# Patient Record
Sex: Male | Born: 1940 | Race: White | Hispanic: No | Smoking: Former smoker
Health system: Southern US, Community
[De-identification: ages and names within clinical notes are randomized; demographics above are authoritative.]

## PROBLEM LIST (undated history)

## (undated) DIAGNOSIS — R011 Cardiac murmur, unspecified: Secondary | ICD-10-CM

## (undated) DIAGNOSIS — F419 Anxiety disorder, unspecified: Secondary | ICD-10-CM

## (undated) DIAGNOSIS — C801 Malignant (primary) neoplasm, unspecified: Secondary | ICD-10-CM

## (undated) DIAGNOSIS — E039 Hypothyroidism, unspecified: Secondary | ICD-10-CM

## (undated) DIAGNOSIS — F32A Depression, unspecified: Secondary | ICD-10-CM

## (undated) DIAGNOSIS — M1990 Unspecified osteoarthritis, unspecified site: Secondary | ICD-10-CM

## (undated) DIAGNOSIS — I219 Acute myocardial infarction, unspecified: Secondary | ICD-10-CM

## (undated) DIAGNOSIS — M869 Osteomyelitis, unspecified: Secondary | ICD-10-CM

## (undated) DIAGNOSIS — L089 Local infection of the skin and subcutaneous tissue, unspecified: Secondary | ICD-10-CM

## (undated) DIAGNOSIS — L409 Psoriasis, unspecified: Secondary | ICD-10-CM

## (undated) DIAGNOSIS — T148XXA Other injury of unspecified body region, initial encounter: Secondary | ICD-10-CM

## (undated) DIAGNOSIS — E7800 Pure hypercholesterolemia, unspecified: Secondary | ICD-10-CM

## (undated) DIAGNOSIS — I2510 Atherosclerotic heart disease of native coronary artery without angina pectoris: Secondary | ICD-10-CM

## (undated) DIAGNOSIS — S68019A Complete traumatic metacarpophalangeal amputation of unspecified thumb, initial encounter: Secondary | ICD-10-CM

## (undated) DIAGNOSIS — R0600 Dyspnea, unspecified: Secondary | ICD-10-CM

## (undated) DIAGNOSIS — I350 Nonrheumatic aortic (valve) stenosis: Secondary | ICD-10-CM

## (undated) DIAGNOSIS — N400 Enlarged prostate without lower urinary tract symptoms: Secondary | ICD-10-CM

## (undated) DIAGNOSIS — I739 Peripheral vascular disease, unspecified: Secondary | ICD-10-CM

## (undated) HISTORY — DX: Cardiac murmur, unspecified: R01.1

## (undated) HISTORY — DX: Benign prostatic hyperplasia without lower urinary tract symptoms: N40.0

## (undated) HISTORY — DX: Peripheral vascular disease, unspecified: I73.9

## (undated) HISTORY — PX: OTHER SURGICAL HISTORY: SHX169

## (undated) HISTORY — PX: DENTAL SURGERY: SHX609

## (undated) HISTORY — DX: Nonrheumatic aortic (valve) stenosis: I35.0

## (undated) HISTORY — DX: Acute myocardial infarction, unspecified: I21.9

## (undated) HISTORY — DX: Pure hypercholesterolemia, unspecified: E78.00

## (undated) HISTORY — DX: Atherosclerotic heart disease of native coronary artery without angina pectoris: I25.10

## (undated) HISTORY — DX: Psoriasis, unspecified: L40.9

## (undated) HISTORY — DX: Osteomyelitis, unspecified: M86.9

---

## 1979-07-24 DIAGNOSIS — L409 Psoriasis, unspecified: Secondary | ICD-10-CM

## 1979-07-24 HISTORY — DX: Psoriasis, unspecified: L40.9

## 2002-05-03 ENCOUNTER — Emergency Department (HOSPITAL_COMMUNITY): Admission: EM | Admit: 2002-05-03 | Discharge: 2002-05-03 | Payer: Self-pay | Admitting: Emergency Medicine

## 2002-11-22 DIAGNOSIS — I219 Acute myocardial infarction, unspecified: Secondary | ICD-10-CM

## 2002-11-22 DIAGNOSIS — I2510 Atherosclerotic heart disease of native coronary artery without angina pectoris: Secondary | ICD-10-CM

## 2002-11-22 HISTORY — DX: Atherosclerotic heart disease of native coronary artery without angina pectoris: I25.10

## 2002-11-22 HISTORY — DX: Acute myocardial infarction, unspecified: I21.9

## 2007-03-02 ENCOUNTER — Ambulatory Visit (HOSPITAL_COMMUNITY): Admission: AD | Admit: 2007-03-02 | Discharge: 2007-03-03 | Payer: Self-pay | Admitting: Cardiovascular Disease

## 2009-09-02 ENCOUNTER — Emergency Department (HOSPITAL_COMMUNITY): Admission: EM | Admit: 2009-09-02 | Discharge: 2009-09-02 | Payer: Self-pay | Admitting: Emergency Medicine

## 2009-11-03 ENCOUNTER — Ambulatory Visit: Admission: RE | Admit: 2009-11-03 | Discharge: 2009-11-03 | Payer: Self-pay | Admitting: Family Medicine

## 2009-11-03 ENCOUNTER — Encounter (INDEPENDENT_AMBULATORY_CARE_PROVIDER_SITE_OTHER): Payer: Self-pay | Admitting: Family Medicine

## 2009-11-06 ENCOUNTER — Encounter (INDEPENDENT_AMBULATORY_CARE_PROVIDER_SITE_OTHER): Payer: Self-pay | Admitting: Family Medicine

## 2009-11-06 ENCOUNTER — Ambulatory Visit (HOSPITAL_COMMUNITY): Admission: RE | Admit: 2009-11-06 | Discharge: 2009-11-06 | Payer: Self-pay | Admitting: Family Medicine

## 2009-11-06 ENCOUNTER — Ambulatory Visit: Payer: Self-pay | Admitting: Vascular Surgery

## 2009-11-07 ENCOUNTER — Ambulatory Visit: Payer: Self-pay | Admitting: Vascular Surgery

## 2010-02-13 ENCOUNTER — Ambulatory Visit: Payer: Self-pay | Admitting: Vascular Surgery

## 2010-02-13 ENCOUNTER — Encounter: Admission: RE | Admit: 2010-02-13 | Discharge: 2010-02-13 | Payer: Self-pay | Admitting: Vascular Surgery

## 2010-07-28 ENCOUNTER — Ambulatory Visit: Payer: Self-pay | Admitting: Cardiovascular Disease

## 2011-01-05 ENCOUNTER — Ambulatory Visit (INDEPENDENT_AMBULATORY_CARE_PROVIDER_SITE_OTHER): Payer: Medicare Other | Admitting: Vascular Surgery

## 2011-01-05 DIAGNOSIS — I743 Embolism and thrombosis of arteries of the lower extremities: Secondary | ICD-10-CM

## 2011-01-06 NOTE — Assessment & Plan Note (Signed)
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  He had been seen and evaluated by myself in December 2010, extending into the first of 2011.  He had undergone echocardiogram showing no evidence of atheroembolic source and also underwent a CT angio of his chest, abdomen, and pelvis.  This showed mild dilatation of his ascending aorta.  No significant atherosclerotic changes in his thoracic aorta.  The aorta did have some irregularities in the infrarenal area and also calcified plaque and irregularity in both iliacs, more so on the right than on the left.  At that time, I recommended antiplatelet therapy with aspirin and had not suggested any further intervention.  I explained that if he did have recurrent episodes of atheroemboli, that we would recommend arteriography for further evaluation.  He presents now with new changes of cyanosis in his toes.  This has almost completely resolved since his evaluation 1 year ago, and over the past several weeks has had recurrent changes.  He is quite active, quite healthy, with no other major medical difficulties.  He also reports that he has had some chest pressure with increased activity.  He is to see Dr. Elease Hashimoto in 2 weeks.  PAST MEDICAL HISTORY:  Significant only for hypertension.  He is very healthy, very compliant with a healthy diet.  Quit smoking 3 or 4 years ago.  Does not drink alcohol.  REVIEW OF SYSTEMS:  Otherwise negative.  PHYSICAL EXAMINATION:  A well-developed and well-nourished white male in no acute distress.  Blood pressure 138/83, pulse 63, respirations 18. HEENT is normal.  Heart:  Regular rate and rhythm without murmur.  His radial, femoral, popliteal, and posterior tibial pulses are 2+ bilaterally.  Abdomen is soft, nontender with no  masses. Musculoskeletal shows no major deformities or cyanosis.  Neurologic:  No focal weakness or paresthesias.  He does have some very small punctate areas over his left great toe but does have significance cyanosis over all of his toes with some blistering over the tip of his right fourth toe.  I have recommended that he undergo arteriography for further evaluation and explained treatment options to include potential stenting of any irregularity.  I explained that it is impossible to know exactly if this was atheroembolic and where it came from.  He is quite hesitant to proceed with any intervention and wishes to discuss this further with Dr. Elease Hashimoto on his visit with him in 2 weeks.  Apparently there is some consideration of cardiac catheterization, and we should be able to coordinate this with infrarenal arteriography if needed.  We will defer any further treatment until he has had an opportunity to see Dr. Elease Hashimoto.    Larina Earthly, M.D.  TFE/MEDQ  D:  01/05/2011  T:  01/06/2011  Job:  [id number]  cc:   Duncan Dull, M.D. Vesta Mixer, M.D.

## 2011-01-19 ENCOUNTER — Ambulatory Visit (HOSPITAL_COMMUNITY): Payer: Medicare Other | Attending: Family Medicine

## 2011-01-19 DIAGNOSIS — I079 Rheumatic tricuspid valve disease, unspecified: Secondary | ICD-10-CM | POA: Insufficient documentation

## 2011-01-19 DIAGNOSIS — E785 Hyperlipidemia, unspecified: Secondary | ICD-10-CM | POA: Insufficient documentation

## 2011-01-19 DIAGNOSIS — R011 Cardiac murmur, unspecified: Secondary | ICD-10-CM | POA: Insufficient documentation

## 2011-01-19 DIAGNOSIS — I739 Peripheral vascular disease, unspecified: Secondary | ICD-10-CM | POA: Insufficient documentation

## 2011-01-19 DIAGNOSIS — I359 Nonrheumatic aortic valve disorder, unspecified: Secondary | ICD-10-CM

## 2011-01-21 ENCOUNTER — Ambulatory Visit (INDEPENDENT_AMBULATORY_CARE_PROVIDER_SITE_OTHER): Payer: Medicare Other | Admitting: Cardiovascular Disease

## 2011-01-21 DIAGNOSIS — E7800 Pure hypercholesterolemia, unspecified: Secondary | ICD-10-CM

## 2011-01-21 DIAGNOSIS — I350 Nonrheumatic aortic (valve) stenosis: Secondary | ICD-10-CM | POA: Insufficient documentation

## 2011-01-21 DIAGNOSIS — I359 Nonrheumatic aortic valve disorder, unspecified: Secondary | ICD-10-CM

## 2011-01-21 MED ORDER — METOPROLOL TARTRATE 50 MG PO TABS: 50.0000 mg | ORAL_TABLET | Freq: Two times a day (BID) | ORAL | Status: AC

## 2011-01-21 NOTE — Progress Notes (Signed)
No current outpatient prescriptions on file prior to visit.   Patient Active Problem List  Diagnoses  . Aortic stenosis   No current outpatient prescriptions on file prior to visit.   No current outpatient prescriptions on file prior to visit.   No family history on file.

## 2011-01-22 NOTE — Progress Notes (Signed)
This encounter was created in error - please disregard.

## 2011-01-22 NOTE — Progress Notes (Signed)
Addended by: Kristeen Miss on: 01/22/2011 08:57 AM   Modules accepted: Level of Service, SmartSet

## 2011-02-24 ENCOUNTER — Telehealth: Payer: Self-pay | Admitting: Cardiovascular Disease

## 2011-02-24 NOTE — Telephone Encounter (Signed)
NEEDS OFFICE NOTE FROM 01/21/11 AND ANY TELEPHONE ENCOUNTERS FROM 07/28/10 THUR PRESENT

## 2011-02-24 NOTE — Telephone Encounter (Signed)
Faxed paperwork that was asked for.Alfonso Ramus RN

## 2011-03-09 ENCOUNTER — Observation Stay (HOSPITAL_COMMUNITY)
Admission: RE | Admit: 2011-03-09 | Discharge: 2011-03-10 | Disposition: A | Discharge status: Home / Self Care | Payer: Medicare Other | Source: Ambulatory Visit | Attending: Cardiology | Admitting: Cardiology

## 2011-03-09 DIAGNOSIS — Z8249 Family history of ischemic heart disease and other diseases of the circulatory system: Secondary | ICD-10-CM | POA: Insufficient documentation

## 2011-03-09 DIAGNOSIS — I10 Essential (primary) hypertension: Secondary | ICD-10-CM | POA: Insufficient documentation

## 2011-03-09 DIAGNOSIS — Z79899 Other long term (current) drug therapy: Secondary | ICD-10-CM | POA: Insufficient documentation

## 2011-03-09 DIAGNOSIS — R0609 Other forms of dyspnea: Secondary | ICD-10-CM | POA: Insufficient documentation

## 2011-03-09 DIAGNOSIS — I2510 Atherosclerotic heart disease of native coronary artery without angina pectoris: Principal | ICD-10-CM | POA: Insufficient documentation

## 2011-03-09 DIAGNOSIS — Z7982 Long term (current) use of aspirin: Secondary | ICD-10-CM | POA: Insufficient documentation

## 2011-03-09 DIAGNOSIS — D696 Thrombocytopenia, unspecified: Secondary | ICD-10-CM | POA: Insufficient documentation

## 2011-03-09 DIAGNOSIS — I359 Nonrheumatic aortic valve disorder, unspecified: Secondary | ICD-10-CM | POA: Insufficient documentation

## 2011-03-09 DIAGNOSIS — I2582 Chronic total occlusion of coronary artery: Secondary | ICD-10-CM | POA: Insufficient documentation

## 2011-03-09 DIAGNOSIS — Z7902 Long term (current) use of antithrombotics/antiplatelets: Secondary | ICD-10-CM | POA: Insufficient documentation

## 2011-03-09 DIAGNOSIS — E7800 Pure hypercholesterolemia, unspecified: Secondary | ICD-10-CM | POA: Insufficient documentation

## 2011-03-09 DIAGNOSIS — R0989 Other specified symptoms and signs involving the circulatory and respiratory systems: Secondary | ICD-10-CM | POA: Insufficient documentation

## 2011-03-09 LAB — POCT ACTIVATED CLOTTING TIME: Activated Clotting Time: 346 seconds

## 2011-03-09 LAB — CK TOTAL AND CKMB (NOT AT ARMC): Relative Index: INVALID (ref 0.0–2.5)

## 2011-03-09 LAB — PLATELET INHIBITION P2Y12
P2Y12 % Inhibition: 33 %
Platelet Function  P2Y12: 146 [PRU] — ABNORMAL LOW (ref 194–418)
Platelet Function Baseline: 219 [PRU] (ref 194–418)

## 2011-03-10 LAB — CBC
HCT: 39.5 % (ref 39.0–52.0)
Hemoglobin: 13.8 g/dL (ref 13.0–17.0)
MCHC: 34.9 g/dL (ref 30.0–36.0)
MCV: 89.4 fL (ref 78.0–100.0)
Platelets: 115 10*3/uL — ABNORMAL LOW (ref 150–400)
RBC: 4.42 MIL/uL (ref 4.22–5.81)
RDW: 12.5 % (ref 11.5–15.5)

## 2011-03-10 LAB — BASIC METABOLIC PANEL
CO2: 25 mEq/L (ref 19–32)
Creatinine, Ser: 0.63 mg/dL (ref 0.4–1.5)

## 2011-03-25 NOTE — Discharge Summary (Signed)
[name], [name] NO.:  [id number]  MEDICAL RECORD NO.:  [id number]           PATIENT TYPE:  O  LOCATION:  6524                         FACILITY:  [facility]  PHYSICIAN:  Lema Heinkel N. Sharyn Simmons, M.D. DATE OF BIRTH:  [birth year]  DATE OF ADMISSION:  03/09/2011 DATE OF DISCHARGE:  03/10/2011                              DISCHARGE SUMMARY   ADMITTING DIAGNOSES: 1. Accelerated angina. 2. Coronary artery disease. 3. 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 4. Moderate aortic stenosis. 5. Hypercholesteremia. 6. History of tobacco abuse. 7. Positive family history of coronary artery disease.  FINAL DIAGNOSES: 1. Stable angina status post percutaneous transluminal coronary     angioplasty to mid and distal chronically total left occluded right     coronary artery with suboptimal result.  The patient will require     further PCI to completely recanalized right coronary artery. 2. Coronary artery disease. 3. 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 4. Hypercholesteremia. 5. Moderate aortic stenosis. 6. Thrombocytopenia. 7. History of tobacco abuse. 8. Positive family history of coronary artery disease.  DISCHARGE HOME MEDICATIONS: 1. Nitrostat 0.4 mg sublingual use as directed. 2. Enteric-coated aspirin 81 mg 1 tablet daily. 3. Crestor 40 mg 1 tablet daily. 4. Fish oil 1000 mg 1 capsule 3 times daily. 5. Folic acid 1 mg daily as before. 6. Magnesium oxide 1 tablet daily as before. 7. Multivitamin 1 tablet daily as before. 8. Plavix 75 mg 1 tablet daily. 9. Potassium gluconate, over the counter 1 tablet daily as before. 10.Saw Palmetto 1 tablet by mouth daily as before. 11.Vitamin B complex 1 tablet daily as before. 12.Vitamin C 1 tablet by mouth twice daily. 13.Zetia 10 mg 1 tablet daily. 14.Zinc 50 mg tablet daily as before.  DIET:  Low salt, low cholesterol.  ACTIVITY:  Increase activity slowly.  The patient  has been given post PTCA instructions.  Avoid any lifting, pushing, or pulling.  The patient has been advised to drink plenty of fluids.  Follow up with me in 1 week.  The patient will be rescheduled for PCI to chronically occluded RCA in approximately 1 month or sooner if recurrent chest pain.  The patient was seen by phase I rehab.  We will schedule him for phase II cardiac rehab once the RCA has been stented.  CONDITION AT DISCHARGE:  Stable.  BRIEF HISTORY AND HOSPITAL COURSE:  Darin Simmons is a 70 year old white male with past medical history significant for coronary artery disease, history of silent inferior wall MI in the past, moderate aortic stenosis, hypercholesteremia, history of tobacco abuse, positive family history of coronary artery disease.  Complains of exertional chest pain off and on occasionally radiating to the left arm relieved with rest. Also complains of exertional dyspnea associated with feeling weak, tired and fatigued.  Denies any nausea, vomiting, diaphoresis.  Denies PND, orthopnea, leg swelling.  Denies palpitation at present, but states that in the past.  No lightheadedness or syncope.  Denies relation of chest pain to food, breathing, or movement.  PAST MEDICAL HISTORY:  As above.  PAST  SURGICAL HISTORY:  None.  ALLERGIES:  No known drug allergies.  MEDICATION AT HOME:  He was on aspirin, Plavix, Crestor, Zetia, fish oil, multivitamin, B complex, Nitrostat sublingual, vitamin B complex, zinc, magnesium.  SOCIAL HISTORY:  He is divorced, no children.  Smoked 1 pack per day for 45 years, quit 4 years ago.  No history of alcohol abuse, retired, worked as Gaffer in the past.  FAMILY HISTORY:  His father died of MI in his 60s.  Mother is alive. She is 101.  One brother had CABG in 60s.  One brother has asthma.  One sister in good health.  PHYSICAL EXAMINATION:  GENERAL:  He is alert and oriented x3 in no acute distress. VITAL SIGNS:  Blood  pressure was 124/76, pulse was 61 and regular. HEENT:  Conjunctivae was pink. NECK:  Supple, no JVD, no bruit. LUNGS:  Clear to auscultation without rhonchi or rales. CARDIOVASCULAR:  S1 and S2, was normal.  There was 2/6 systolic murmur of aortic stenosis. ABDOMEN:  Soft.  Bowel sounds were present, nontender. EXTREMITIES:  There is no clubbing, cyanosis, or edema.  Discussed with the patient at length regarding various options of treatment, i.e. medical versus noninvasive stress testing versus left cath, possible PTCA stenting, and possibly AVR in the future, its risks and benefits i.e. death, MI, stroke, need for emergency CABG, local vascular complications, risk of perforation, etc. and consented for PCI.  BRIEF HOSPITAL COURSE:  The patient was a.m. admit and underwent left cardiac cath with selective left and right coronary angiography and PTCA to mid and distal RCA as per procedure report.  Procedure was stopped due to large volume of contrast and fluoro time of about 70 minutes. The patient had only small segment of distal RCA, which needs to be recanalized.  The patient remained stable during the procedure.  The patient did not have any chest pain during the hospitalization or during the procedure.  His groin is stable with no evidence of hematoma or bruit.  Postprocedure, his CPK is 98 with MB of 3.4.  Phase I cardiac rehab was called.  The patient has been ambulating in hallway without any problems.  His groin is stable with no evidence of hematoma or bruit.  The patient will be discharged home and will be followed up in my office in 1 week.  We will monitor his platelet count as outpatient closely.  The patient will be rescheduled for PCI to RCA in approximately 1 month or sooner if he gets recurrent chest pain.     Darin Simmons, M.D.     MNH/MEDQ  D:  03/10/2011  T:  03/10/2011  Job:  [id number]  Electronically Signed by Rinaldo Cloud M.D. on 03/25/2011 08:32:50  AM

## 2011-03-25 NOTE — Cardiovascular Report (Signed)
NAMETRAMPUS, MCQUERRY NO.:  [id number]  MEDICAL RECORD NO.:  [id number]           PATIENT TYPE:  O  LOCATION:  6524                         FACILITY:  [facility]  PHYSICIAN:  Lorra Freeman N. Sharyn Lull, M.D. DATE OF BIRTH:  May 25, 1941  DATE OF PROCEDURE:  03/09/2011 DATE OF DISCHARGE:                           CARDIAC CATHETERIZATION   PROCEDURES: 1. Left cardiac catheterization with selective left and right coronary     angiography via right groin using Judkins technique. 2. Attempted percutaneous transluminal coronary angioplasty to     chronically occluded long right coronary artery using initially     1.25 x 20 mm long Sprinter balloon and then 1.5 x 20 mm long mini     Trek balloon and then 2.0 x 20 mm long mini trek balloon.  INDICATIONS FOR PROCEDURE:  Mr. Zehnder is a 70 year old white male with past medical history significant for coronary artery disease, history of silent inferior wall myocardial infarction, moderate aortic stenosis, hypercholesteremia, history of tobacco abuse in the past, positive family history of coronary artery disease.  He complains of exertional chest pain off and on occasionally radiating to the left arm, relieved with rest.  Also complains of exertional dyspnea associated with feeling weak, tired, and fatigued.  Denies any nausea, vomiting, or diaphoresis. Denies PND, orthopnea, or leg swelling.  Denies palpitation at present, but had occasional in the past.  No lightheadedness or syncopal episode. Denies any relation of chest pain to food, breathing, or movement.  The patient had left cardiac cath approximately 4 years ago which showed a 40-50% proximal left circumflex stenosis and chronically occluded RCA beyond midportion which was filling distally from left system and acute marginal system.  I discussed with the patient regarding left cath, possible PTCA and stenting, its risks and benefits, i.e., death, MI, stroke, need for emergency  CABG, local vascular complications, and possibly need for aortic valve replacement in the future.  The patient consented for PCI.  PROCEDURE:  After obtaining the informed consent, the patient was brought to the Cath Lab and was placed on fluoroscopy table.  Right groin was prepped and draped in the usual fashion.  Xylocaine 1% was used for local anesthesia in the right groin.  With the help of thin- wall needle, a 6-French arterial sheath was placed.  The sheath was aspirated and flushed.  Next, a 6-French left Judkins catheter was advanced over the wire under fluoroscopic guidance up to the ascending aorta.  Wire was pulled out.  The catheter was aspirated and connected to the manifold.  Catheter was further advanced and engaged into left coronary ostium.  Multiple views of the left system were taken.  Next, the catheter was disengaged and was pulled out over the wire and was replaced with a 6-French right Judkins catheter which was advanced over the wire under fluoroscopic guidance up to the ascending aorta.  Wire was pulled out.  The catheter was aspirated and connected to the manifold.  Catheter was further advanced and engaged into right coronary ostium.  Multiple views of the right system were taken.  Next, the catheter was disengaged  and was pulled out over the wire.  Sheaths were aspirated and flushed.  FINDINGS:  LV was not done.  Left main was patent.  LAD has 20-25% ostial proximal and mid stenosis.  Diagonal 1 and 2 were very small. Ramus was very, very small.  Left circumflex has 50-60% proximal stenosis.  OM-1 is large which is patent.  RCA has 40-50% proximal sequential stenosis and then subtotal occlusion beyond the midportion filling from collaterals, from left system, and from acute marginal branch.  INTERVENTIONAL PROCEDURE:  Attempted to cross the lesion initially with ChoICE PT 0.0014 wire without success and then Fielder XT 0.009 wire with microcatheter for  support.  Fielder wire could not be advanced beyond the distal portion.  Fielder wire could be advanced up to the distal RCA, but could not be advanced further.  The Fielder wire was exchanged with ChoICE PT moderate support and then with 6-g [employer] and then 12-g [employer].  The wire could not be advanced beyond the bifurcation either into PDA or PLV branches and then PTCA to distal and mid RCA was done initially using 1.25 mm x 20 mm long Sprinter balloon and then 1.5 x 20 mm long mini Trek and then 2.0 x 20 mm long mini Trek balloon in distal and mid RCA going up to 10-12 atmospheric pressure.  Mid and distal RCA lesions were dilated from 90- 95% to less than 50% residual in the midportion and subtotal occlusion of distal RCA as before.  PDA and PLV branches were filling from collaterals as before.  The procedure was called off as fluoro time was above 70 minutes and total contrast used was about 300-350 mL.  The patient tolerated the procedure well.  There were no complications.  We will stage the procedure in future if the patient continues to have recurrent anginal chest pain.  The patient was transferred to the recovery room in stable condition.     Eduardo Osier. Sharyn Lull, M.D.     MNH/MEDQ  D:  03/09/2011  T:  03/10/2011  Job:  [id number]  Electronically Signed by Rinaldo Cloud M.D. on 03/25/2011 08:32:52 AM

## 2011-04-06 NOTE — Assessment & Plan Note (Signed)
OFFICE VISIT   CALEN, GEISTER  DOB:  02-23-1941                                       02/13/2010  ZOXWR#:[id number]   Patient presents today for followup of his probable atheroemboli to his  right foot.  I had seen him initially in December of 2010, at which time  he had apparent atheroemboli to his feet.  Fortunately, this has  continued to improve.  He does have occasional cyanotic changes, but  usually this is not the case and he does not have any pain.  He is quite  active, resuming his usual pre-event activities.  He has undergone a 2D  echocardiogram showing aortic stenosis but no obvious vegetations or  other cause for atrial embolus .  He is here today for evaluation with a  CT angiogram of his chest, abdomen, pelvis, and lower extremities.  I  reviewed his films with patient.   PHYSICAL EXAMINATION:  Otherwise unchanged.  His blood pressure is  115/75, pulse 58, respirations 16.  His temperature is 97.4.  He is in  no acute distress.  HEENT is normal.  Chest is clear.  Abdomen is  without masses or tenderness.  Musculoskeletal:  No major deformities or  cyanosis.  Neurologic:  No focal weakness or paresthesias.  Skin without  ulcers or rashes.   His CT scan does show a mildly dilated ascending aorta.  He has no  significant atherosclerotic changes in his thoracic aorta.  He does have  some irregularity in his infrarenal aorta and also calcified plaque and  irregularity in both iliacs, somewhat more so on the right than on the  left.   I discussed this at length with patient.  I suspect that the most likely  source of emboli would be his right iliac system.  He does not have any  focal stenosis, and I would therefore not recommend any intervention  since this is only speculative.  Should he have ongoing evidence of  emboli, we would recommend intervention on his right iliac system.  Otherwise he will continue his followup with Dr. Elease Hashimoto and see  [place] on an  as-needed basis.     Larina Earthly, M.D.  Electronically Signed   TFE/MEDQ  D:  02/13/2010  T:  02/13/2010  Job:  [id number]   cc:   Vesta Mixer, M.D.  Duncan Dull, M.D.

## 2011-04-06 NOTE — Consult Note (Signed)
NEW PATIENT CONSULTATION   Darin Simmons, Darin Simmons  DOB:  12/18/1940                                       11/07/2009  NFAOZ#:[id number]   The patient presents today for evaluation of ischemic changes on his  right foot.  He is a very active, healthy 70 year old gentleman who  reports a 2-week history of skin changes on his right foot.  He noted  mild pain but erythema and superficial ulceration over the great, second  and fourth toe.  He does not recall any trauma to this area.  He had  outpatient evaluation to rule out DVT and also outpatient arterial  study.  I have these for review as well as office notes from Dr. Kevan Ny.  He does not have any prior history of atheroembolic events.  He does  have a history of coronary artery disease with asymptomatic coronary  blockage with collateralization on studies in 2008.  He has mild  discomfort and does have more cyanosis with dependency in his toes.  He  does not have any similar changes in the left foot.   PAST MEDICAL HISTORY:  Negative for other major medical difficulties,  specifically no pulmonary disease.   FAMILY HISTORY:  Significant for premature atherosclerotic disease in  father and brother.   SOCIAL HISTORY:  He is retired.  He does not smoke, having quit in 2008.  He does not drink alcohol.   REVIEW OF SYSTEMS:  His weight is reported at 137 pounds.  He is 5 feet  11 inches tall.  He denies any weight loss or weight gain.  CARDIAC:  Significant for known aortic valvular disease with heart  murmur.  PULMONARY:  Negative.  GI:  Negative.  GU:  Negative.  VASCULAR:  Negative.  NEUROLOGIC:  Negative.  MUSCULOSKELETAL:  Negative.  PSYCHIATRIC:  Negative.  HEENT:  Negative.  HEMATOLOGIC:  Negative.  He does have a history of scalp rashes.   PHYSICAL EXAM:  Well developed, well-nourished white male appearing  stated age of 28.  Blood pressure is 106/71, pulse 60, respirations 18,  his temperature is 98.0.  He  is in no acute distress.  Head is  normocephalic, atraumatic.  Pupils equal, round, reactive to light.  Extraocular movements are intact.  Neck:  He does not have any cervical  lymphadenopathy, bruits and no JVD.  Chest:  Clear bilateral with no  rales, rhonchi or wheezes.  Heart:  Regular rate and rhythm.  He does  have a pansystolic murmur.  Pulses:  He has 2+ radial and 2+ posterior  tibial pulses bilaterally.  Abdomen:  No bruits.  Soft, nontender.  No  palpable masses.  Musculoskeletal:  No major deformities or cyanosis.  Neurologic:  No focal weakness or paresthesias.  Skin:  No ulcers or  rashes.   On reviewing his duplex exam, he did have normal ankle-arm indices but  did have dampened toe pressures on the right.  I discussed the  significance of this with the patient.  I explained that he would either  have in situ occlusion of small vessels but more likely would have an  atheroembolic event.  He does have a history of known aortic valvular  disease.  I explained this would be the most likely cause for emboli.  He is to see Dr. Elease Hashimoto in the next several weeks for repeat  echocardiogram to rule this out.  If his echocardiogram is negative, I  would recommend a CT angiogram of his chest, abdomen and pelvis and  runoff to rule out embolic source of this.  He will contact our office  for scheduling of this should he have a negative evaluation with Dr.  Elease Hashimoto.  I explained that this should be self-limiting and it does not  appear to that he will have any permanent tissue loss from his current  level of ischemia.     Larina Earthly, M.D.  Electronically Signed   TFE/MEDQ  D:  11/07/2009  T:  11/10/2009  Job:  [id number]   cc:   Duncan Dull, M.D.  Vesta Mixer, M.D.

## 2011-04-09 NOTE — Discharge Summary (Signed)
NAMEPEDRO, WHITERS NO.:  [id number]   MEDICAL RECORD NO.:  [id number]          PATIENT TYPE:  OIB   LOCATION:  6524                         FACILITY:  [facility]   PHYSICIAN:  Vesta Mixer, M.D. DATE OF BIRTH:  [birth year]   DATE OF ADMISSION:  03/02/2007  DATE OF DISCHARGE:  03/03/2007                               DISCHARGE SUMMARY   DISCHARGE DIAGNOSES:  1. Coronary artery disease - total occlusion of his right coronary      artery with good collateral filling.  2. Hyperlipidemia with low HDL.   DISCHARGE MEDICATIONS:  1. Simvastatin 40 mg q.h.s.  2. Aspirin 81 mg a day.  3. Potassium chloride __________ a day.  4. Milk thistle and  biotin once a day.  5. Ginger root and bilberry once a day.  6. Saw palmetto once a day.  7. Fish oil 1000 mg three times a day.  8. Selenium, zinc, magnesium once a day.  9. MSM once a day.  10.Chromium once a day.  11.Glucosamine once a day.  12.Acetyl L-carnitine once a day.  13.Alpha lipoic acid once a day.   DISPOSITION:  The patient will see Dr. Elease Hashimoto in 1-2 weeks for an  appointment.  He is to follow up with Dr. Kevan Ny as needed.   HISTORY:  Mr. Cobern is a 70 year old gentleman who we were asked to  see for some episodes of chest discomfort and some EKG changes.  He was  admitted for heart catheterization based on these clinical findings.  Please see dictated H&P for further details.   HOSPITAL COURSE PER PROBLEMS:  PROBLEM #1.  CHEST PAIN:  The patient had  negative cardiac enzymes.  He underwent heart catheterization and was  found to have chronic total occlusion of his right coronary artery.  He  had very good collateral filling with right to right and left to right  collaterals.  He had normal left ventricular systolic function with an  ejection fraction of around 65%.  There were no segmental wall motion  abnormalities.   We will continue with medical therapy.  His heart rate is in the low  60's and  blood pressures at the lower end of normal so I do not think  that we can add any additional medications at this time, but we would  certainly want to keep his blood pressure and heart rate in the normal  range.  His LDL was found to be 90 and his HDL was 32.  We  will discontinue his lovastatin and start him on simvastatin 40 mg a  day.  He already eats a fairly low fat, low cholesterol diet.  We will  see him back in the office, as noted above.   PROBLEM #2:  All of his other medical problems remained stable.           ______________________________  Vesta Mixer, M.D.     PJN/MEDQ  D:  03/03/2007  T:  03/03/2007  Job:  [id number]   cc:   Duncan Dull, M.D.

## 2011-04-09 NOTE — H&P (Signed)
NAMEMarland Kitchen  Darin Simmons NO.:  [id number]   MEDICAL RECORD NO.:  [id number]           PATIENT TYPE:   LOCATION:                                 FACILITY:   PHYSICIAN:  Vesta Mixer, M.D. DATE OF BIRTH:  1941/10/26   DATE OF ADMISSION:  03/01/2007  DATE OF DISCHARGE:                              HISTORY & PHYSICAL   Darin Simmons is a middle-aged gentleman with a history of chest pains and  cigarette smoking.  He was referred to [place] for further evaluation of some  chest pains that he had a couple weeks ago.   Darin Simmons was seen in our office back in 2000.  He had a stress test which  was unremarkable.  He was not seen in our office since that time.   He has been at a very stressful job for the past 3 or 4 years.  He has  is working 56 hours a week and does not get enough sleep.   Approximately 2 weeks ago, he had severe episode of chest ache.  That  episode lasted almost all night long.  The next day, he was left with  just kind of a generalized dull ache.  He waited a couple of days and  then called Dr. Kevan Ny' office.  He was seen in Dr. Kevan Ny' office later  that week and was found to have some EKG abnormalities.  He was referred  here and is now seen for further evaluation.   Darin Simmons still has some episodes of dull aching.  He has also had some  fatigue and some shortness of breath.  He had recurrence of this chest  pain when he was walking one evening.  However, the next day he did not  have any episodes of chest pain when he was doing his normal walk.  The  pain was described as a pressure.  There was a question of radiation out  into his shoulder and maybe to his arm.  It was associated with some  diaphoresis and some short of breath.  It is not associated with any  eating or drinking or change in position.   Current medications are multiple dietary supplements including milk  thistle, ginger root, Biotin, folic acid, L-carnitine, glucosamine,  brewer's yeast,  chromium, fish oil, saw palmetto, selenium.  He also  takes aspirin 81 mg a day and lovastatin 40 mg a day.  He has no known  drug allergies.   PAST MEDICAL HISTORY:  History of hyperlipidemia.   SOCIAL HISTORY:  The patient smoked a pack a day for 40 years and  stopped in 2006.   FAMILY HISTORY:  His father died at age 4 due to some heart-related  issues.  His mother is 58 years old and is still living.   REVIEW OF SYSTEMS:  Reviewed and is essentially negative except for as  noted in the HPI.   On exam, he is a middle-aged gentleman in no acute distress.  He is  alert and oriented x3 and his mood and affect are normal.  His weight is  153, blood pressure is 130/70 with heart rate of 58.  HEENT:  Exam reveals 2+ pupils carotids.  He has no bruits, no JVD, no  thyromegaly.  LUNGS:  Clear to auscultation.  HEART:  Regular rate S1-S2.  ABDOMINAL:  Exam reveals good bowel sounds and is nontender.  EXTREMITIES:  He has no clubbing, cyanosis or edema.  NEUROLOGICAL:  Exam is nonfocal.   His EKG from last week reveals normal sinus rhythm.  He has mild ST-  segment elevation in the anterior leads with T-wave inversions in the  anterolateral leads.  These are new from his EKG in 2000.  Today, his  EKG reveals normal sinus rhythm.  The ST-segment elevation anteriorly  has largely resolved and he has some nonspecific changes laterally.   We have drawn laboratory data which is pending at the time of dictation.   Darin Simmons presents with symptoms of an acute coronary syndrome and angina.  I  suspect that he may have had a very small myocardial infarction 2 weeks  ago when these pains started.  He has had some EKG changes which have  now generally resolved.  He is not having any acute problems now.  I  suspect that he ruptured a plaque and had a transient occlusion.  I have  given him a prescription for nitroglycerin.  He is on an aspirin a day.  We will anticipate doing a heart catheterization  with possible PTCA  tomorrow.  He will likely need to be admitted to the hospital because he  does not have anybody to stay with him at night.  We have discussed the  risks, benefits and options of heart catheterization.  He understands  and agrees to proceed.           ______________________________  Vesta Mixer, M.D.     PJN/MEDQ  D:  03/01/2007  T:  03/02/2007  Job:  [id number]   cc:   Duncan Dull, M.D.

## 2011-04-09 NOTE — Cardiovascular Report (Signed)
NAMECORLISS, LAMARTINA NO.:  [id number]   MEDICAL RECORD NO.:  [id number]          PATIENT TYPE:  OIB   LOCATION:  2899                         FACILITY:  [facility]   PHYSICIAN:  Vesta Mixer, M.D. DATE OF BIRTH:  July 18, 1941   DATE OF PROCEDURE:  03/02/2007  DATE OF DISCHARGE:                            CARDIAC CATHETERIZATION   PROCEDURE:  Left heart catheterization with coronary angiography and  with Angio-Seal closure.   The right femoral artery was easily cannulated using modified Seldinger  technique.  The right iliac artery was somewhat tortuous and required a  Wholey wire to get up into the central circulation.  There was a  moderate degree of calcification of the right iliac artery.  All  catheters were exchanged over a guidewire.   At the end of case, the angiogram revealed that the vessel was suitable  for Angio-Seal closure.  We proceeded with Angio-Seal closure using the  standard technique.  I was assisted by Dr. __________ Lolita Lenz.   HEMODYNAMICS:  LV pressure was 111/0.  His aortic pressure was 108/56.   Angiography, left main:  The left main is mildly calcified.  There are  moderate irregularities.   The left anterior descending artery reveals mild irregularities in the  proximal mid segments.  The distal vessel is fairly unremarkable.  There  is a large septal branch which is normal.   The left circumflex artery is a fairly large vessel.  There is a  proximal 30-40% stenosis.   The first obtuse marginal artery is a fairly large vessel.  There are  minor luminal irregularities.  The distal circumflex provides flow to a  small posterolateral segment artery which is fairly normal.   Right coronary artery is diffusely and severely diseased.  There is  moderate to severe disease in the proximal segment and then the RCA is  subtotally occluded at its midpoint.  There is some ectasia just above  the area of the occlusion/sub-occlusion.  There is  a large right  ventricular marginal branch which provides flow to the posterior  descending artery, and the posterior descending artery and distal right  coronary artery filled in a retrograde fashion.  Of note is that the  distal RCA and posterior descending artery also fill via left-to-right  collaterals.   The left ventriculogram was performed in a 30 RAO position.  We had a  slight amount of difficulty getting across the aortic valve which  explains the small gradient.  This reveals overall normal left  ventricular systolic function.  There were no segmental wall motion  abnormalities.   The right iliac angiogram was performed in a 30 RAO position.  It  reveals adequate and suitable anatomy for Angio-Seal closure.   COMPLICATIONS:  None.   CONCLUSION:  1. Single-vessel coronary artery disease involving the right coronary      artery.  Has good collateral filling from the      right-to-right and left-to-right collaterals.  2. Normal left ventricular systolic function.   The patient drove himself to the hospital today.  He will likely need to  stay for 23-hour observation.           ______________________________  Vesta Mixer, M.D.     PJN/MEDQ  D:  03/02/2007  T:  03/02/2007  Job:  [id number]   cc:   Duncan Dull, M.D.

## 2011-04-13 ENCOUNTER — Inpatient Hospital Stay (HOSPITAL_COMMUNITY)
Admission: RE | Admit: 2011-04-13 | Discharge: 2011-04-15 | DRG: 247 | Disposition: A | Discharge status: Home / Self Care | Payer: Medicare Other | Source: Ambulatory Visit | Attending: Cardiology | Admitting: Cardiology

## 2011-04-13 DIAGNOSIS — I2510 Atherosclerotic heart disease of native coronary artery without angina pectoris: Principal | ICD-10-CM | POA: Diagnosis present

## 2011-04-13 DIAGNOSIS — I2582 Chronic total occlusion of coronary artery: Secondary | ICD-10-CM | POA: Diagnosis present

## 2011-04-13 DIAGNOSIS — Z87891 Personal history of nicotine dependence: Secondary | ICD-10-CM

## 2011-04-13 DIAGNOSIS — D696 Thrombocytopenia, unspecified: Secondary | ICD-10-CM | POA: Diagnosis present

## 2011-04-13 DIAGNOSIS — E7800 Pure hypercholesterolemia, unspecified: Secondary | ICD-10-CM | POA: Diagnosis present

## 2011-04-13 DIAGNOSIS — I359 Nonrheumatic aortic valve disorder, unspecified: Secondary | ICD-10-CM | POA: Diagnosis present

## 2011-04-13 LAB — PLATELET INHIBITION P2Y12: Platelet Function Baseline: 251 [PRU] (ref 194–418)

## 2011-04-13 LAB — POCT ACTIVATED CLOTTING TIME: Activated Clotting Time: 376 seconds

## 2011-04-14 LAB — CBC
HCT: 36.7 % — ABNORMAL LOW (ref 39.0–52.0)
Hemoglobin: 12.9 g/dL — ABNORMAL LOW (ref 13.0–17.0)
MCHC: 35.1 g/dL (ref 30.0–36.0)
Platelets: 117 10*3/uL — ABNORMAL LOW (ref 150–400)
RBC: 4.14 MIL/uL — ABNORMAL LOW (ref 4.22–5.81)
RDW: 12.6 % (ref 11.5–15.5)
WBC: 7 10*3/uL (ref 4.0–10.5)

## 2011-04-14 LAB — BASIC METABOLIC PANEL
CO2: 24 mEq/L (ref 19–32)
Chloride: 111 mEq/L (ref 96–112)
GFR calc Af Amer: >60 mL/min (ref >60)
Glucose, Bld: 88 mg/dL (ref 70–99)

## 2011-04-14 LAB — CK TOTAL AND CKMB (NOT AT ARMC)
CK, MB: 4 ng/mL (ref 0.3–4.0)
Total CK: 120 U/L (ref 7–232)

## 2011-04-15 LAB — CBC
Hemoglobin: 13.9 g/dL (ref 13.0–17.0)
MCH: 31.4 pg (ref 26.0–34.0)
MCHC: 35.5 g/dL (ref 30.0–36.0)
MCV: 88.7 fL (ref 78.0–100.0)
RBC: 4.42 MIL/uL (ref 4.22–5.81)
RDW: 12.5 % (ref 11.5–15.5)
WBC: 4.7 10*3/uL (ref 4.0–10.5)

## 2011-04-15 LAB — BASIC METABOLIC PANEL
Creatinine, Ser: 0.58 mg/dL (ref 0.4–1.5)
GFR calc Af Amer: >60 mL/min (ref >60)
Sodium: 141 mEq/L (ref 135–145)

## 2011-05-03 ENCOUNTER — Ambulatory Visit (HOSPITAL_COMMUNITY): Payer: Medicare Other

## 2011-05-03 ENCOUNTER — Encounter (HOSPITAL_COMMUNITY)
Admission: RE | Admit: 2011-05-03 | Discharge: 2011-05-03 | Disposition: A | Discharge status: Home / Self Care | Payer: Medicare Other | Source: Ambulatory Visit | Attending: Cardiology | Admitting: Cardiology

## 2011-05-03 DIAGNOSIS — Z87891 Personal history of nicotine dependence: Secondary | ICD-10-CM | POA: Insufficient documentation

## 2011-05-03 DIAGNOSIS — I2510 Atherosclerotic heart disease of native coronary artery without angina pectoris: Secondary | ICD-10-CM | POA: Insufficient documentation

## 2011-05-03 DIAGNOSIS — E7800 Pure hypercholesterolemia, unspecified: Secondary | ICD-10-CM | POA: Insufficient documentation

## 2011-05-03 DIAGNOSIS — Z5189 Encounter for other specified aftercare: Secondary | ICD-10-CM | POA: Insufficient documentation

## 2011-05-03 DIAGNOSIS — I359 Nonrheumatic aortic valve disorder, unspecified: Secondary | ICD-10-CM | POA: Insufficient documentation

## 2011-05-03 DIAGNOSIS — Z9861 Coronary angioplasty status: Secondary | ICD-10-CM | POA: Insufficient documentation

## 2011-05-05 ENCOUNTER — Encounter (HOSPITAL_COMMUNITY): Payer: Medicare Other

## 2011-05-05 ENCOUNTER — Ambulatory Visit (HOSPITAL_COMMUNITY): Payer: Medicare Other

## 2011-05-07 ENCOUNTER — Ambulatory Visit (HOSPITAL_COMMUNITY): Payer: Medicare Other

## 2011-05-07 ENCOUNTER — Encounter (HOSPITAL_COMMUNITY): Payer: Medicare Other

## 2011-05-10 ENCOUNTER — Encounter (HOSPITAL_COMMUNITY): Payer: Medicare Other

## 2011-05-10 ENCOUNTER — Ambulatory Visit (HOSPITAL_COMMUNITY): Payer: Medicare Other

## 2011-05-12 ENCOUNTER — Ambulatory Visit (HOSPITAL_COMMUNITY): Payer: Medicare Other

## 2011-05-12 ENCOUNTER — Encounter (HOSPITAL_COMMUNITY): Payer: Medicare Other

## 2011-05-13 NOTE — Discharge Summary (Signed)
[name], COMMISSO NO.:  [id number]  MEDICAL RECORD NO.:  [id number]           PATIENT TYPE:  I  LOCATION:  6533                         FACILITY:  [facility]  PHYSICIAN:  Dellas Guard N. Sharyn Lull, M.D. 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  DISCHARGE DIAGNOSIS:  Stable angina status post PCI to chronically occluded right coronary artery, coronary artery disease, history of silent inferior wall myocardial infarction, hypertension, moderate aortic stenosis, two-vessel coronary artery disease, hypercholesteremia, tobacco abuse, thrombocytopenia.  DISCHARGE HOME MEDICATIONS: 1. Enteric-coated aspirin 325 mg 1 tablet daily. 2. Plavix 75 mg 1 tablet daily. 3. Nitrostat 0.4 mg sublingual use as directed. 4. Artificial tears 1 drop in both eyes as before. 5. Crestor 40 mg 1 tablet daily. 6. Fish oil 1000 mg 1 capsule three times daily. 7. Folic acid 1 mg 1 tablet daily. 8. Magnesium oxide over-the-counter one daily as before. 9. Multivitamin 1 tablet daily as before. 10.Saw palmetto 1 tablet daily by mouth as before. 11.Tamsulosin 0.4 mg 1 capsule daily as before. 12.Vitamin B complex 1 tablet daily as before. 13.Vitamin C 500 mg 1 tablet twice daily as before. 14.Zetia 10 mg 1 tablet daily. 15.Zinc 50 mg 1 tablet daily as before.  DIET:  Low-salt, low-cholesterol diet.  ACTIVITY:  Increase activity slowly.  Avoid any lifting, pushing, or pulling for 1 week.  Post PTCA stent instructions have been given.  Follow up with me in 1 week.  The patient is  scheduled for phase II cardiac rehab as an outpatient.  CONDITION AT DISCHARGE:  Stable.  BRIEF HISTORY AND HOSPITAL COURSE:  Mr. Darin Simmons is a 70 year old white male with past medical history significant for coronary artery disease, history of silent inferior wall MI, two-vessel coronary artery disease, had moderate proximal left circumflex stenosis, and chronically occluded RCA, moderate aortic stenosis, hypercholesteremia, history of tobacco abuse, family history of coronary artery disease, was admitted for elective PCI to CTO of RCA and possible FFR of left circumflex proximal lesion, and possible PCI.  The patient complains of exertional chest pain occasionally radiating to the left arm associated with dyspnea and tired and weak feeling.  The patient had attempted PCI to CTO of RCA on March 09, 2011 without complete revascularization as the patient had a large amount of contrast and fluoro time was more than 60 minutes. Procedure was stopped as the distal cap could not be traverse at that time.  The patient is brought here for stage PCI.  PAST MEDICAL HISTORY:  As above.  PAST SURGICAL HISTORY:  None.  ALLERGIES:  No known drug allergies.  MEDICATION AT HOME: 1. Enteric-coated aspirin 325 mg 1 tablet daily. 2. Plavix 75 mg 1 tablet daily. 3. Nitrostat 0.4 mg sublingual use as directed. 4. Artificial tears 1 drop in  both eyes as before. 5. Crestor 40 mg 1 tablet daily. 6. Fish oil 1000 mg 1 capsule three times daily. 7. Folic acid 1 mg 1 tablet daily. 8. Magnesium oxide over-the-counter one daily as before. 9. Multivitamin 1 tablet daily as before. 10.Saw palmetto 1 tablet daily by mouth as before. 11.Tamsulosin 0.4 mg 1 capsule daily as before. 12.Vitamin B complex 1 tablet daily as before. 13.Vitamin C 500 mg 1 tablet twice daily as before. 14.Zetia 10 mg 1 tablet daily. 15.Zinc 50 mg 1 tablet daily as before.  SOCIAL HISTORY:  Divorced.  No children.  Smoked one-pack  per day for 45 years, quit 4 years ago.  No history of alcohol abuse.  He is retired, worked as Gaffer in the past.  FAMILY HISTORY:  Positive for coronary artery disease.  PHYSICAL EXAMINATION:  GENERAL:  He is alert, awake, and oriented x3, in no acute distress. VITAL SIGNS:  Blood pressure is 140/80, pulse was 68. HEENT:  Conjunctivae was pink. NECK:  Supple.  No JVD.  No bruit. LUNGS:  Clear to auscultation without rhonchi or rales. CARDIOVASCULAR:  S1 and S2 was normal.  There was 2/6 systolic murmur of aortic stenosis. ABDOMEN:  Soft, bowel sounds present, nontender. EXTREMITIES:  There is no clubbing, cyanosis, or edema.  LABS:  Hemoglobin was 12.9, hematocrit 36.7, white count of 7.0, platelet count 117,000.  Repeat platelet count was 110,000, which has been stable from last cath in April 2012.  His post PTCA stenting, CK was 120, MB 4.0, his P2Y12 percent inhibition was 60% and PRU value was 100.  His labs today, BUN is 8, creatinine 0.58, glucose 91, potassium 3.8.  BRIEF HOSPITAL COURSE:  The patient was admitted electively on May 22, and underwent PTCA stenting to chronically occluded RCA as per procedure report.  The patient tolerated the procedure well.  There were no complications.  The patient did not have any episodes of chest pain during the hospital stay.  His groin is stable with no evidence of hematoma or bruit.  Phase I cardiac rehab was called.  The patient has been ambulating in hallway without any problems due to large amount of contrast load and the patient was well hydrated and renal function was monitored closely.  His renal function stayed stable.  The patient will be discharged home on the above medications and will be followed up in my office in 1 week.  The patient is scheduled for phase II cardiac rehab as an outpatient.     Eduardo Osier. Sharyn Lull, M.D.     MNH/MEDQ  D:  04/15/2011  T:  04/16/2011  Job:  [id number]  Electronically Signed by  Rinaldo Cloud M.D. on 05/13/2011 09:37:10 AM

## 2011-05-13 NOTE — Cardiovascular Report (Signed)
NAMEJAYCUB, NOORANI NO.:  [id number]  MEDICAL RECORD NO.:  [id number]           PATIENT TYPE:  O  LOCATION:  6533                         FACILITY:  [facility]  PHYSICIAN:  Aleida Crandell N. Sharyn Lull, M.D. DATE OF BIRTH:  February 26, 1941  DATE OF PROCEDURE:  04/13/2011 DATE OF DISCHARGE:                           CARDIAC CATHETERIZATION   PROCEDURE: 1. Left cardiac catheterization with selective left and right coronary     angiography via right groin using Judkins technique. 2. Successful percutaneous transluminal coronary angioplasty to     chronically occluded mid and distal right coronary artery using     initially 1.5 x 20-mm long Sprinter balloon and then 2.5 x 20 mm     long Sprinter balloon. 3. Successful deployment of 2.5 x 38-mm long Xience drug-eluting stent     in distal right coronary artery. 4. Successful postdilatation of this stent using 2.75 x 20-mm long [place]     Trek balloon. 5. Successful deployment of 2.75 x 38-mm long Xience drug-eluting     stent in mid right coronary artery. 6. Successful postdilatation of this stent using 3.0 x 25-mm long [place]     Trek balloon going up to 13 atmospheric pressure. 7. Successful deployment of 2.75 x 24-mm long PROMUS Element drug-     eluting stent in proximal right coronary artery. 8. Successful postdilatation of this stent using 3.25 x 20-mm long [place]     Trek balloon going up to 13 atmospheric pressure.  INDICATIONS FOR THE PROCEDURE:  Mr. Barreiro is a 70 year old white male with past medical history significant for coronary artery disease, history of silent inferior wall MI, two-vessel coronary artery disease. He had moderate proximal left circumflex stenosis and chronically occluded RCA, moderate aortic stenosis, hypercholesteremia, history of tobacco abuse, positive family history of coronary artery disease was admitted for elective PCI to CTO of RCA and possible FFR of left circumflex proximal lesion and possible PCI.   The patient complains of exertional chest pain occasionally radiating to the left arm associated with dyspnea and tired and weak feeling.  The patient had attempted PCI to CTO of RCA on March 09, 2011, without completely vascularization as the patient had large amount of contrast and fluoro time was more than 60 minutes.  The patient is brought for staged elective PCI to CTO of RCA.  PROCEDURE:  After obtaining the informed consent, the patient was brought to the cath lab and was placed on fluoroscopy table.  Right groin was prepped and draped in usual fashion.  Xylocaine 1% was used for local anesthesia in the right groin.  With the help of thin-wall needle, a 6-French arterial sheath was placed.  The sheath was aspirated and flushed.  Next, 6-French left Judkins catheter was advanced over the wire under fluoroscopic guidance up to the ascending aorta.  Wire was pulled out, the catheter was aspirated and connected to the manifold. Catheter was further advanced and engaged into left coronary ostium. Multiple views of the left system were taken.  Next, the catheter was disengaged and was pulled out over the wire and was replaced with 6-  French right guiding catheter which was advanced over the wire under fluoroscopic guidance up to the ascending aorta.  Wire was pulled out, the catheter was aspirated and connected to the manifold.  Catheter was further advanced  and engaged into right coronary ostium.  Multiple views of the right system were taken.  FINDINGS:  LV was not done and left main was patent.  LAD has mild disease as before with faint collaterals to distal RCA.  Left circumflex has 50-60% proximal stenosis as before.  RCA has 50-60% mid sequential stenosis and 100% occluded distally long chronically occluded lesion with collaterals from RV branch.  INTERVENTIONAL PROCEDURE:  Successful PTCA to distal RCA and ostial PDA was done initially using 1.5 x 20-mm long Sprinter balloon  and then 2.5 x 20-mm long Sprinter balloon for predilatation and then 2.5 x 38-mm long Xience DES was deployed at 11 atmospheric pressure.  Stent was postdilated using 2.75 x 20-mm long [place] Trek balloon going up to 13 atmospheric pressure and then 2.75 x 38-mm long Xience drug-eluting stent was deployed at 11 atmospheric pressure in mid RCA.  The stent was postdilated using 3.0 x 25-mm long Trek balloon going up to 13 atmospheric pressure and then 2.75 x 24-mm long PROMUS Element drug- eluting stent was deployed at 11 atmospheric pressure in proximal RCA. The stent was postdilated using 3.25 x 20 mm long [place] Trek balloon going up to 13 atmospheric pressure.  Lesions were dilated from 50-60%-100% to less than 10% residual with excellent TIMI grade 3 distal flow without evidence of dissection or distal embolization.  The patient received weight-based Angiomax and 600 mg of Plavix during the procedure.  The patient tolerated the procedure well.  There were no complications.  The patient did receive more than 475  mL of contrast and more than 70 minutes of fluoro time as the patient had long chronically occluded RCA lesion. We had significant difficulty in crossing the lesion using fine cross and filter wire initially and then Choice PT and Luge wires in the end.  The patient tolerated the procedure well.  There were no complications.  The patient was transferred to the recovery room in stable condition.  We will hydrate him well and monitor his renal function closely.     Eduardo Osier. Sharyn Lull, M.D.     MNH/MEDQ  D:  04/13/2011  T:  04/14/2011  Job:  [id number]  cc:   Cath Lab  Electronically Signed by Rinaldo Cloud M.D. on 05/13/2011 09:37:06 AM

## 2011-05-14 ENCOUNTER — Ambulatory Visit (HOSPITAL_COMMUNITY): Payer: Medicare Other

## 2011-05-14 ENCOUNTER — Encounter (HOSPITAL_COMMUNITY): Payer: Medicare Other

## 2011-05-17 ENCOUNTER — Ambulatory Visit (HOSPITAL_COMMUNITY): Payer: Medicare Other

## 2011-05-17 ENCOUNTER — Encounter (HOSPITAL_COMMUNITY): Payer: Medicare Other

## 2011-05-19 ENCOUNTER — Ambulatory Visit (HOSPITAL_COMMUNITY): Payer: Medicare Other

## 2011-05-19 ENCOUNTER — Encounter (HOSPITAL_COMMUNITY): Payer: Medicare Other

## 2011-05-21 ENCOUNTER — Encounter (HOSPITAL_COMMUNITY): Payer: Medicare Other

## 2011-05-21 ENCOUNTER — Ambulatory Visit (HOSPITAL_COMMUNITY): Payer: Medicare Other

## 2011-05-24 ENCOUNTER — Encounter (HOSPITAL_COMMUNITY): Payer: Medicare Other | Attending: Cardiology

## 2011-05-24 ENCOUNTER — Ambulatory Visit (HOSPITAL_COMMUNITY): Payer: Medicare Other

## 2011-05-24 DIAGNOSIS — Z5189 Encounter for other specified aftercare: Secondary | ICD-10-CM | POA: Insufficient documentation

## 2011-05-24 DIAGNOSIS — Z87891 Personal history of nicotine dependence: Secondary | ICD-10-CM | POA: Insufficient documentation

## 2011-05-24 DIAGNOSIS — E7800 Pure hypercholesterolemia, unspecified: Secondary | ICD-10-CM | POA: Insufficient documentation

## 2011-05-24 DIAGNOSIS — I359 Nonrheumatic aortic valve disorder, unspecified: Secondary | ICD-10-CM | POA: Insufficient documentation

## 2011-05-24 DIAGNOSIS — Z9861 Coronary angioplasty status: Secondary | ICD-10-CM | POA: Insufficient documentation

## 2011-05-24 DIAGNOSIS — I2510 Atherosclerotic heart disease of native coronary artery without angina pectoris: Secondary | ICD-10-CM | POA: Insufficient documentation

## 2011-05-26 ENCOUNTER — Encounter (HOSPITAL_COMMUNITY): Payer: Medicare Other

## 2011-05-26 ENCOUNTER — Ambulatory Visit (HOSPITAL_COMMUNITY): Payer: Medicare Other

## 2011-05-28 ENCOUNTER — Ambulatory Visit (HOSPITAL_COMMUNITY): Payer: Medicare Other

## 2011-05-28 ENCOUNTER — Encounter (HOSPITAL_COMMUNITY): Payer: Medicare Other

## 2011-05-31 ENCOUNTER — Encounter (HOSPITAL_COMMUNITY): Payer: Medicare Other

## 2011-05-31 ENCOUNTER — Ambulatory Visit (HOSPITAL_COMMUNITY): Payer: Medicare Other

## 2011-06-02 ENCOUNTER — Encounter (HOSPITAL_COMMUNITY): Payer: Medicare Other

## 2011-06-02 ENCOUNTER — Ambulatory Visit (HOSPITAL_COMMUNITY): Payer: Medicare Other

## 2011-06-04 ENCOUNTER — Ambulatory Visit (HOSPITAL_COMMUNITY): Payer: Medicare Other

## 2011-06-04 ENCOUNTER — Encounter (HOSPITAL_COMMUNITY): Payer: Medicare Other

## 2011-06-07 ENCOUNTER — Ambulatory Visit (HOSPITAL_COMMUNITY): Payer: Medicare Other

## 2011-06-07 ENCOUNTER — Encounter (HOSPITAL_COMMUNITY): Payer: Medicare Other

## 2011-06-09 ENCOUNTER — Encounter (HOSPITAL_COMMUNITY): Payer: Medicare Other

## 2011-06-09 ENCOUNTER — Ambulatory Visit (HOSPITAL_COMMUNITY): Payer: Medicare Other

## 2011-06-11 ENCOUNTER — Ambulatory Visit (HOSPITAL_COMMUNITY): Payer: Medicare Other

## 2011-06-11 ENCOUNTER — Encounter (HOSPITAL_COMMUNITY): Payer: Medicare Other

## 2011-06-14 ENCOUNTER — Ambulatory Visit (HOSPITAL_COMMUNITY): Payer: Medicare Other

## 2011-06-14 ENCOUNTER — Encounter (HOSPITAL_COMMUNITY): Payer: Medicare Other

## 2011-06-16 ENCOUNTER — Encounter (HOSPITAL_COMMUNITY): Payer: Medicare Other

## 2011-06-16 ENCOUNTER — Ambulatory Visit (HOSPITAL_COMMUNITY): Payer: Medicare Other

## 2011-06-18 ENCOUNTER — Encounter (HOSPITAL_COMMUNITY): Payer: Medicare Other

## 2011-06-18 ENCOUNTER — Ambulatory Visit (HOSPITAL_COMMUNITY): Payer: Medicare Other

## 2011-06-21 ENCOUNTER — Encounter (HOSPITAL_COMMUNITY): Payer: Medicare Other

## 2011-06-21 ENCOUNTER — Ambulatory Visit (HOSPITAL_COMMUNITY): Payer: Medicare Other

## 2011-06-23 ENCOUNTER — Encounter (HOSPITAL_COMMUNITY): Payer: Medicare Other

## 2011-06-23 ENCOUNTER — Ambulatory Visit (HOSPITAL_COMMUNITY): Payer: Medicare Other

## 2011-06-25 ENCOUNTER — Ambulatory Visit (HOSPITAL_COMMUNITY): Payer: Medicare Other

## 2011-06-25 ENCOUNTER — Encounter (HOSPITAL_COMMUNITY): Payer: Medicare Other

## 2011-06-28 ENCOUNTER — Ambulatory Visit (HOSPITAL_COMMUNITY): Payer: Medicare Other

## 2011-06-28 ENCOUNTER — Encounter (HOSPITAL_COMMUNITY): Payer: Medicare Other

## 2011-06-30 ENCOUNTER — Ambulatory Visit (HOSPITAL_COMMUNITY): Payer: Medicare Other

## 2011-06-30 ENCOUNTER — Encounter (HOSPITAL_COMMUNITY): Payer: Medicare Other | Attending: Cardiology

## 2011-06-30 DIAGNOSIS — Z9861 Coronary angioplasty status: Secondary | ICD-10-CM | POA: Insufficient documentation

## 2011-06-30 DIAGNOSIS — Z5189 Encounter for other specified aftercare: Secondary | ICD-10-CM | POA: Insufficient documentation

## 2011-06-30 DIAGNOSIS — E7800 Pure hypercholesterolemia, unspecified: Secondary | ICD-10-CM | POA: Insufficient documentation

## 2011-06-30 DIAGNOSIS — I359 Nonrheumatic aortic valve disorder, unspecified: Secondary | ICD-10-CM | POA: Insufficient documentation

## 2011-06-30 DIAGNOSIS — Z87891 Personal history of nicotine dependence: Secondary | ICD-10-CM | POA: Insufficient documentation

## 2011-06-30 DIAGNOSIS — I2510 Atherosclerotic heart disease of native coronary artery without angina pectoris: Secondary | ICD-10-CM | POA: Insufficient documentation

## 2011-07-02 ENCOUNTER — Encounter (HOSPITAL_COMMUNITY): Payer: Medicare Other

## 2011-07-02 ENCOUNTER — Ambulatory Visit (HOSPITAL_COMMUNITY): Payer: Medicare Other

## 2011-07-05 ENCOUNTER — Ambulatory Visit (HOSPITAL_COMMUNITY): Payer: Medicare Other

## 2011-07-05 ENCOUNTER — Encounter (HOSPITAL_COMMUNITY): Payer: Medicare Other

## 2011-07-07 ENCOUNTER — Ambulatory Visit (HOSPITAL_COMMUNITY): Payer: Medicare Other

## 2011-07-07 ENCOUNTER — Encounter (HOSPITAL_COMMUNITY): Payer: Medicare Other

## 2011-07-09 ENCOUNTER — Encounter (HOSPITAL_COMMUNITY): Payer: Medicare Other

## 2011-07-09 ENCOUNTER — Ambulatory Visit (HOSPITAL_COMMUNITY): Payer: Medicare Other

## 2011-07-12 ENCOUNTER — Ambulatory Visit (HOSPITAL_COMMUNITY): Payer: Medicare Other

## 2011-07-12 ENCOUNTER — Encounter (HOSPITAL_COMMUNITY): Payer: Medicare Other

## 2011-07-14 ENCOUNTER — Encounter (HOSPITAL_COMMUNITY): Payer: Medicare Other

## 2011-07-14 ENCOUNTER — Ambulatory Visit (HOSPITAL_COMMUNITY): Payer: Medicare Other

## 2011-07-16 ENCOUNTER — Ambulatory Visit (HOSPITAL_COMMUNITY): Payer: Medicare Other

## 2011-07-16 ENCOUNTER — Encounter (HOSPITAL_COMMUNITY): Payer: Medicare Other

## 2011-07-19 ENCOUNTER — Ambulatory Visit (HOSPITAL_COMMUNITY): Payer: Medicare Other

## 2011-07-19 ENCOUNTER — Encounter (HOSPITAL_COMMUNITY): Payer: Medicare Other

## 2011-07-21 ENCOUNTER — Ambulatory Visit (HOSPITAL_COMMUNITY): Payer: Medicare Other

## 2011-07-21 ENCOUNTER — Encounter (HOSPITAL_COMMUNITY): Payer: Medicare Other

## 2011-07-23 ENCOUNTER — Encounter (HOSPITAL_COMMUNITY): Payer: Medicare Other

## 2011-07-23 ENCOUNTER — Ambulatory Visit (HOSPITAL_COMMUNITY): Payer: Medicare Other

## 2011-07-26 ENCOUNTER — Ambulatory Visit (HOSPITAL_COMMUNITY): Payer: Medicare Other

## 2011-07-26 ENCOUNTER — Encounter (HOSPITAL_COMMUNITY): Payer: Medicare Other

## 2011-07-28 ENCOUNTER — Ambulatory Visit (HOSPITAL_COMMUNITY): Payer: Medicare Other

## 2011-07-28 ENCOUNTER — Encounter (HOSPITAL_COMMUNITY): Payer: Medicare Other | Attending: Cardiology

## 2011-07-28 DIAGNOSIS — Z9861 Coronary angioplasty status: Secondary | ICD-10-CM | POA: Insufficient documentation

## 2011-07-28 DIAGNOSIS — E7800 Pure hypercholesterolemia, unspecified: Secondary | ICD-10-CM | POA: Insufficient documentation

## 2011-07-28 DIAGNOSIS — I359 Nonrheumatic aortic valve disorder, unspecified: Secondary | ICD-10-CM | POA: Insufficient documentation

## 2011-07-28 DIAGNOSIS — Z87891 Personal history of nicotine dependence: Secondary | ICD-10-CM | POA: Insufficient documentation

## 2011-07-28 DIAGNOSIS — I2510 Atherosclerotic heart disease of native coronary artery without angina pectoris: Secondary | ICD-10-CM | POA: Insufficient documentation

## 2011-07-28 DIAGNOSIS — Z5189 Encounter for other specified aftercare: Secondary | ICD-10-CM | POA: Insufficient documentation

## 2011-07-30 ENCOUNTER — Ambulatory Visit (HOSPITAL_COMMUNITY): Payer: Medicare Other

## 2011-07-30 ENCOUNTER — Encounter (HOSPITAL_COMMUNITY): Payer: Medicare Other

## 2011-08-02 ENCOUNTER — Encounter (HOSPITAL_COMMUNITY): Payer: Medicare Other

## 2011-08-02 ENCOUNTER — Ambulatory Visit (HOSPITAL_COMMUNITY): Payer: Medicare Other

## 2011-08-04 ENCOUNTER — Ambulatory Visit (HOSPITAL_COMMUNITY): Payer: Medicare Other

## 2011-08-04 ENCOUNTER — Encounter (HOSPITAL_COMMUNITY): Payer: Medicare Other

## 2011-08-06 ENCOUNTER — Ambulatory Visit (HOSPITAL_COMMUNITY): Payer: Medicare Other

## 2011-08-06 ENCOUNTER — Encounter (HOSPITAL_COMMUNITY): Payer: Medicare Other

## 2011-08-09 ENCOUNTER — Encounter (HOSPITAL_COMMUNITY): Payer: Medicare Other

## 2011-08-11 ENCOUNTER — Encounter (HOSPITAL_COMMUNITY): Payer: Medicare Other

## 2011-08-13 ENCOUNTER — Encounter (HOSPITAL_COMMUNITY): Payer: Medicare Other

## 2011-08-16 ENCOUNTER — Encounter (HOSPITAL_COMMUNITY): Payer: Medicare Other

## 2011-08-18 ENCOUNTER — Encounter (HOSPITAL_COMMUNITY): Payer: Medicare Other

## 2011-08-20 ENCOUNTER — Encounter (HOSPITAL_COMMUNITY): Payer: Medicare Other

## 2011-08-23 ENCOUNTER — Encounter (HOSPITAL_COMMUNITY): Payer: Medicare Other | Attending: Cardiology

## 2011-08-23 DIAGNOSIS — I359 Nonrheumatic aortic valve disorder, unspecified: Secondary | ICD-10-CM | POA: Insufficient documentation

## 2011-08-23 DIAGNOSIS — E7800 Pure hypercholesterolemia, unspecified: Secondary | ICD-10-CM | POA: Insufficient documentation

## 2011-08-23 DIAGNOSIS — Z9861 Coronary angioplasty status: Secondary | ICD-10-CM | POA: Insufficient documentation

## 2011-08-23 DIAGNOSIS — I2510 Atherosclerotic heart disease of native coronary artery without angina pectoris: Secondary | ICD-10-CM | POA: Insufficient documentation

## 2011-08-23 DIAGNOSIS — Z5189 Encounter for other specified aftercare: Secondary | ICD-10-CM | POA: Insufficient documentation

## 2011-08-23 DIAGNOSIS — Z87891 Personal history of nicotine dependence: Secondary | ICD-10-CM | POA: Insufficient documentation

## 2011-08-25 ENCOUNTER — Encounter (HOSPITAL_COMMUNITY): Payer: Medicare Other

## 2011-08-27 ENCOUNTER — Encounter (HOSPITAL_COMMUNITY): Payer: Medicare Other

## 2011-08-30 ENCOUNTER — Encounter (HOSPITAL_COMMUNITY): Payer: Medicare Other

## 2011-09-01 ENCOUNTER — Encounter (HOSPITAL_COMMUNITY): Payer: Medicare Other

## 2011-09-03 ENCOUNTER — Encounter (HOSPITAL_COMMUNITY): Payer: Medicare Other

## 2011-10-15 ENCOUNTER — Other Ambulatory Visit: Payer: Self-pay | Admitting: Cardiology

## 2012-01-05 DIAGNOSIS — Z79899 Other long term (current) drug therapy: Secondary | ICD-10-CM | POA: Diagnosis not present

## 2012-01-05 DIAGNOSIS — I359 Nonrheumatic aortic valve disorder, unspecified: Secondary | ICD-10-CM | POA: Diagnosis not present

## 2012-01-05 DIAGNOSIS — N400 Enlarged prostate without lower urinary tract symptoms: Secondary | ICD-10-CM | POA: Diagnosis not present

## 2012-01-05 DIAGNOSIS — E7800 Pure hypercholesterolemia, unspecified: Secondary | ICD-10-CM | POA: Diagnosis not present

## 2012-01-05 DIAGNOSIS — I2510 Atherosclerotic heart disease of native coronary artery without angina pectoris: Secondary | ICD-10-CM | POA: Diagnosis not present

## 2012-01-05 DIAGNOSIS — Z125 Encounter for screening for malignant neoplasm of prostate: Secondary | ICD-10-CM | POA: Diagnosis not present

## 2012-01-05 DIAGNOSIS — IMO0001 Reserved for inherently not codable concepts without codable children: Secondary | ICD-10-CM | POA: Diagnosis not present

## 2012-01-05 DIAGNOSIS — Z0000 Encounter for general adult medical examination without abnormal findings: Secondary | ICD-10-CM | POA: Diagnosis not present

## 2012-01-24 DIAGNOSIS — I2510 Atherosclerotic heart disease of native coronary artery without angina pectoris: Secondary | ICD-10-CM | POA: Diagnosis not present

## 2012-01-24 DIAGNOSIS — I739 Peripheral vascular disease, unspecified: Secondary | ICD-10-CM | POA: Diagnosis not present

## 2012-01-24 DIAGNOSIS — I359 Nonrheumatic aortic valve disorder, unspecified: Secondary | ICD-10-CM | POA: Diagnosis not present

## 2012-01-27 ENCOUNTER — Other Ambulatory Visit: Payer: Self-pay | Admitting: Cardiology

## 2012-01-28 DIAGNOSIS — I739 Peripheral vascular disease, unspecified: Secondary | ICD-10-CM | POA: Diagnosis not present

## 2012-01-28 DIAGNOSIS — I359 Nonrheumatic aortic valve disorder, unspecified: Secondary | ICD-10-CM | POA: Diagnosis not present

## 2012-01-28 DIAGNOSIS — I2510 Atherosclerotic heart disease of native coronary artery without angina pectoris: Secondary | ICD-10-CM | POA: Diagnosis not present

## 2012-02-11 ENCOUNTER — Other Ambulatory Visit: Payer: Self-pay | Admitting: Cardiovascular Disease

## 2012-02-11 ENCOUNTER — Other Ambulatory Visit: Payer: Self-pay | Admitting: Cardiology

## 2012-03-06 DIAGNOSIS — K4090 Unilateral inguinal hernia, without obstruction or gangrene, not specified as recurrent: Secondary | ICD-10-CM | POA: Diagnosis not present

## 2012-03-06 DIAGNOSIS — M79609 Pain in unspecified limb: Secondary | ICD-10-CM | POA: Diagnosis not present

## 2012-03-09 DIAGNOSIS — M48061 Spinal stenosis, lumbar region without neurogenic claudication: Secondary | ICD-10-CM | POA: Diagnosis not present

## 2012-03-24 ENCOUNTER — Encounter (INDEPENDENT_AMBULATORY_CARE_PROVIDER_SITE_OTHER): Payer: Self-pay

## 2012-03-28 ENCOUNTER — Encounter (INDEPENDENT_AMBULATORY_CARE_PROVIDER_SITE_OTHER): Payer: Self-pay | Admitting: General Surgery

## 2012-03-28 ENCOUNTER — Ambulatory Visit (INDEPENDENT_AMBULATORY_CARE_PROVIDER_SITE_OTHER): Payer: Medicare Other | Admitting: General Surgery

## 2012-03-28 VITALS — BP 143/86 | HR 65 | Ht 70.5 in | Wt 161.6 lb

## 2012-03-28 DIAGNOSIS — K429 Umbilical hernia without obstruction or gangrene: Secondary | ICD-10-CM

## 2012-03-28 DIAGNOSIS — K4020 Bilateral inguinal hernia, without obstruction or gangrene, not specified as recurrent: Secondary | ICD-10-CM

## 2012-03-28 DIAGNOSIS — I2510 Atherosclerotic heart disease of native coronary artery without angina pectoris: Secondary | ICD-10-CM | POA: Diagnosis not present

## 2012-03-28 DIAGNOSIS — I359 Nonrheumatic aortic valve disorder, unspecified: Secondary | ICD-10-CM | POA: Diagnosis not present

## 2012-03-28 HISTORY — DX: Umbilical hernia without obstruction or gangrene: K42.9

## 2012-03-28 HISTORY — DX: Bilateral inguinal hernia, without obstruction or gangrene, not specified as recurrent: K40.20

## 2012-03-28 NOTE — Progress Notes (Signed)
Patient ID: Darin Simmons, male   DOB: 14-Jan-1941, 71 y.o.   MRN: [id number]  Chief Complaint  Patient presents with  . Pre-op Exam    eval [facility]    HPI Darin Simmons is a 71 y.o. male.   HPI  He is referred by Dr. Shaune Pollack for evaluation of a left inguinal hernia. Back in January, he was lifting heavy bags of leaves.  He injured his back this way and had some radiculopathy. He then began having some tightness in the inguinal area. He saw Dr. Kevan Ny for this was found to have a left inguinal hernia.  He has minimal symptoms from this currently. He is here for further evaluation and treatment of his inguinal hernia. He does have benign prostatic hypertrophy but does not have to strain to urinate.  No constipation.  Past Medical History  Diagnosis Date  . Hypercholesterolemia   . CAD (coronary artery disease)   . Aortic stenosis   . Psoriasis   . BPH (benign prostatic hyperplasia)   . PVD (peripheral vascular disease)   . Heart murmur   . Heart attack     Past Surgical History  Procedure Date  . Heart stent     Family History  Problem Relation Age of Onset  . Heart disease Father     Social History History  Substance Use Topics  . Smoking status: Former Games developer  . Smokeless tobacco: Not on file  . Alcohol Use: No    Allergies  Allergen Reactions  . Diphenhydramine Hcl     Shaky legs    Current Outpatient Prescriptions  Medication Sig Dispense Refill  . aspirin 81 MG tablet Take 81 mg by mouth daily.      Marland Kitchen atorvastatin (LIPITOR) 80 MG tablet       . B Complex Vitamins (VITAMIN B COMPLEX PO) Take by mouth daily.      . clopidogrel (PLAVIX) 75 MG tablet Take 75 mg by mouth daily.      . fish oil-omega-3 fatty acids 1000 MG capsule Take 2 g by mouth daily.      Marland Kitchen GLUCOSAMINE CHONDROITIN COMPLX PO Take 2,000 mg by mouth every other day.      . lisinopril (PRINIVIL,ZESTRIL) 20 MG tablet Take 20 mg by mouth daily.        Marland Kitchen LYCOPENE PO Take 1 capsule by mouth every other  day.      . Magnesium 400 MG CAPS Take by mouth as needed.      . Multiple Vitamins-Minerals (MULTIVITAMIN PO) Take by mouth daily.      . nitroGLYCERIN (NITROSTAT) 0.4 MG SL tablet Place 0.4 mg under the tongue every 5 (five) minutes as needed.      . Nutritional Supplements (VITAMIN D MAINTENANCE PO) Take by mouth daily.      . Omega-3 Fatty Acids (FISH OIL BURP-LESS) 1000 MG CAPS Take 3 tablets by mouth daily.      . Potassium 99 MG TABS Take 99 mg by mouth as needed.      . Saw Palmetto 450 MG CAPS Take 2 capsules by mouth. 2 caplets 3xday      . Selenium (SELENICAPS-200) 200 MCG CAPS Take 2 capsules by mouth every other day.      . vitamin C (ASCORBIC ACID) 500 MG tablet Take 500 mg by mouth daily. 2 tablets daily      . ZETIA 10 MG tablet TAKE ONE TABLET BY MOUTH EVERY DAY  30 each  3  . ZINC ACETATE, ORAL, PO Take 1 mg by mouth every other day.        Review of Systems Review of Systems  Constitutional: Negative.   HENT: Negative for hearing loss.   Respiratory: Positive for shortness of breath.   Cardiovascular: Negative for chest pain and palpitations.  Gastrointestinal: Negative.   Genitourinary:       Nocturia  Musculoskeletal: Positive for back pain.  Neurological: Negative.   Hematological: Negative.     Blood pressure 143/86, pulse 65, height 5' 10.5" (1.791 m), weight 161 lb 9.6 oz (73.301 kg), SpO2 98.00%.  Physical Exam Physical Exam  Constitutional: He appears well-developed and well-nourished.  Cardiovascular: Normal rate and regular rhythm.   Murmur heard. Pulmonary/Chest: Effort normal and breath sounds normal.  Abdominal: Soft. He exhibits no distension and no mass. There is no tenderness.       Small reducible umbilical hernia  Genitourinary:       Moderate-sized left inguinal bulge is reducible.  There is small right inguinal bulge only with a cough. No testicular masses.  Musculoskeletal: He exhibits no edema.    Data Reviewed Dr. Kevan Ny'  note.  Assessment    Bilateral inguinal hernias but only the left is mildly symptomatic. The right is very small and only felt with a cough. He also has a very small umbilical hernia that is asymptomatic.    Plan    Given his other medical problems, I recommended only an open left inguinal hernia repair with mesh at this time. I went over the rationale for this with him. He did need to be off his aspirin and Plavix for one week prior to surgery. We would need to make sure Dr. Eldridge Dace could give him a cardiac clearance for this as well. I have explained the procedure, risks, and aftercare of inguinal hernia repair.  Risks include but are not limited to bleeding, infection, wound problems, anesthesia, recurrence, bladder or intestine injury, urinary retention, testicular dysfunction, chronic pain, mesh problems.  He seems to understand all this. He would like to wait to September to have this fixed. Therefore I will see him back in 3 months for a preoperative visit.       Virat Prather J 03/28/2012, 2:33 PM

## 2012-03-28 NOTE — Patient Instructions (Signed)
Be careful with strenuous activities.

## 2012-07-06 DIAGNOSIS — K4090 Unilateral inguinal hernia, without obstruction or gangrene, not specified as recurrent: Secondary | ICD-10-CM | POA: Diagnosis not present

## 2012-07-06 DIAGNOSIS — Z79899 Other long term (current) drug therapy: Secondary | ICD-10-CM | POA: Diagnosis not present

## 2012-07-06 DIAGNOSIS — E7800 Pure hypercholesterolemia, unspecified: Secondary | ICD-10-CM | POA: Diagnosis not present

## 2012-07-06 DIAGNOSIS — I2510 Atherosclerotic heart disease of native coronary artery without angina pectoris: Secondary | ICD-10-CM | POA: Diagnosis not present

## 2012-09-07 DIAGNOSIS — Z23 Encounter for immunization: Secondary | ICD-10-CM | POA: Diagnosis not present

## 2013-01-01 ENCOUNTER — Encounter (INDEPENDENT_AMBULATORY_CARE_PROVIDER_SITE_OTHER): Payer: Self-pay

## 2013-01-01 ENCOUNTER — Encounter (INDEPENDENT_AMBULATORY_CARE_PROVIDER_SITE_OTHER): Payer: Self-pay | Admitting: General Surgery

## 2013-01-01 ENCOUNTER — Ambulatory Visit (INDEPENDENT_AMBULATORY_CARE_PROVIDER_SITE_OTHER): Payer: Medicare Other | Admitting: General Surgery

## 2013-01-01 ENCOUNTER — Other Ambulatory Visit (INDEPENDENT_AMBULATORY_CARE_PROVIDER_SITE_OTHER): Payer: Self-pay | Admitting: General Surgery

## 2013-01-01 VITALS — BP 122/68 | HR 61 | Temp 97.2°F | Resp 18 | Ht 69.5 in | Wt 155.4 lb

## 2013-01-01 DIAGNOSIS — E7800 Pure hypercholesterolemia, unspecified: Secondary | ICD-10-CM | POA: Diagnosis not present

## 2013-01-01 DIAGNOSIS — I359 Nonrheumatic aortic valve disorder, unspecified: Secondary | ICD-10-CM | POA: Diagnosis not present

## 2013-01-01 DIAGNOSIS — I2510 Atherosclerotic heart disease of native coronary artery without angina pectoris: Secondary | ICD-10-CM | POA: Diagnosis not present

## 2013-01-01 DIAGNOSIS — K4020 Bilateral inguinal hernia, without obstruction or gangrene, not specified as recurrent: Secondary | ICD-10-CM | POA: Diagnosis not present

## 2013-01-01 DIAGNOSIS — K429 Umbilical hernia without obstruction or gangrene: Secondary | ICD-10-CM

## 2013-01-01 DIAGNOSIS — Z01810 Encounter for preprocedural cardiovascular examination: Secondary | ICD-10-CM | POA: Diagnosis not present

## 2013-01-01 DIAGNOSIS — K4021 Bilateral inguinal hernia, without obstruction or gangrene, recurrent: Secondary | ICD-10-CM

## 2013-01-01 DIAGNOSIS — K469 Unspecified abdominal hernia without obstruction or gangrene: Secondary | ICD-10-CM | POA: Diagnosis not present

## 2013-01-01 NOTE — Patient Instructions (Addendum)
We will schedule the surgery after he had been seen by the cardiologist and urologist. Please work on getting some homecare for after the surgery.  You will need to stop your Plavix, aspirin, fish oil, and saw palmetto 7 days before surgery.  Please note the post operative instructions below.  [facility] _______[facility]  UMBILICAL OR INGUINAL HERNIA REPAIR: POST OP INSTRUCTIONS  Always review your discharge instruction sheet given to you by the facility where your surgery was performed. IF YOU HAVE DISABILITY OR FAMILY LEAVE FORMS, YOU MUST BRING THEM TO THE OFFICE FOR PROCESSING.   DO NOT GIVE THEM TO YOUR DOCTOR.  1. A  prescription for pain medication may be given to you upon discharge.  Take your pain medication as prescribed, if needed.  If narcotic pain medicine is not needed, then you may take acetaminophen (Tylenol) or ibuprofen (Advil) as needed. 2. Take your usually prescribed medications unless otherwise directed. 3. If you need a refill on your pain medication, please contact your pharmacy.  They will contact our office to request authorization. Prescriptions will not be filled after 5 pm or on week-ends. 4. You should follow a light diet the first 24 hours after arrival home, such as soup and crackers, etc.  Be sure to include lots of fluids daily.  Resume your normal diet the day after surgery. 5. Most patients will experience some swelling and bruising around the umbilicus or in the groin and scrotum.  Ice packs and reclining will help.  Swelling and bruising can take several days to resolve.  6. It is common to experience some constipation if taking pain medication after surgery.  Increasing fluid intake and taking a stool softener (such as Colace) will usually help or prevent this problem from occurring.  A mild laxative (Milk of Magnesia or Miralax) should be taken according to package directions if there are no bowel movements after 48 hours. 7. Unless discharge  instructions indicate otherwise, you may remove your bandages 24-48 hours after surgery, and you may shower at that time.  You may have steri-strips (small skin tapes) in place directly over the incision.  These strips should be left on the skin for 7-10 days.  If your surgeon used skin glue on the incision, you may shower in 24 hours.  The glue will flake off over the next 2-3 weeks.  Any sutures or staples will be removed at the office during your follow-up visit. 8. ACTIVITIES:  You may resume regular (light) daily activities beginning the next day-such as daily self-care, walking, climbing stairs-gradually increasing activities as tolerated.  You may have sexual intercourse when it is comfortable.  Refrain from any heavy lifting or straining until approved by your doctor. a. You may drive when you are no longer taking prescription pain medication, you can comfortably wear a seatbelt, and you can safely maneuver your car and apply brakes. b. RETURN TO WORK:  __________________________________________________________ 9. You should see your doctor in the office for a follow-up appointment approximately 2-3 weeks after your surgery.  Make sure that you call for this appointment within a day or two after you arrive home to insure a convenient appointment time. 10. OTHER INSTRUCTIONS:  __________________________________________________________________________________________________________________________________________________________________________________________  WHEN TO CALL YOUR DOCTOR: 1. Fever over 101.5 2. Inability to urinate 3. Nausea and/or vomiting 4. Extreme swelling or bruising 5. Continued bleeding from incision. 6. Increased pain, redness, or drainage from the incision  The clinic staff is available to answer your questions during regular  business hours.  Please don't hesitate to call and ask to speak to one of the nurses for clinical concerns.  If you have a medical emergency, go to  the nearest emergency room or call 911.  A surgeon from [facility] is always on call at the hospital   [address], [address], [place], [place]  [address] ?  [address] [address], [place], [place]   [address] [phone] ? [phone] ? FAX [phone] Web site: [email or web address]

## 2013-01-01 NOTE — Progress Notes (Signed)
Patient ID: Darin Simmons, male   DOB: 10/23/1941, 72 y.o.   MRN: [id number]  Chief Complaint  Patient presents with  . Follow-up    eval hernia    HPI Darin Simmons is a 72 y.o. male.   HPI  He is well known to me. I saw him 9 months ago at which time he was noted to have a symptomatic left inguinal hernia, an asymptomatic right inguinal hernia, and an asymptomatic umbilical hernia. He did not want to have the repair during the summertime. He presents today to discuss repair. However, the right inguinal hernia in the umbilical hernia had both become symptomatic. He is interested in having all 3 hernias repaired area he does have a significant problem with BPH symptoms and has had urinary retention following two previous procedures requiring a Foley catheter.  Past Medical History  Diagnosis Date  . Hypercholesterolemia   . CAD (coronary artery disease)   . Aortic stenosis   . Psoriasis   . BPH (benign prostatic hyperplasia)   . PVD (peripheral vascular disease)   . Heart murmur   . Heart attack     Past Surgical History  Procedure Laterality Date  . Heart stent      Family History  Problem Relation Age of Onset  . Heart disease Father     Social History History  Substance Use Topics  . Smoking status: Former Smoker  . Smokeless tobacco: Not on file  . Alcohol Use: No    Allergies  Allergen Reactions  . Diphenhydramine Hcl     Shaky legs    Current Outpatient Prescriptions  Medication Sig Dispense Refill  . aspirin 81 MG tablet Take 81 mg by mouth daily. 2 tablets in the morning, 2 tablets in the evening.      . atorvastatin (LIPITOR) 80 MG tablet       . B Complex Vitamins (VITAMIN B COMPLEX PO) Take by mouth daily.      . clopidogrel (PLAVIX) 75 MG tablet Take 75 mg by mouth daily.      . fish oil-omega-3 fatty acids 1000 MG capsule Take 2 g by mouth daily.      . GLUCOSAMINE CHONDROITIN COMPLX PO Take 2,000 mg by mouth every other day.      . Magnesium 400 MG  CAPS Take by mouth as needed.      . Nutritional Supplements (VITAMIN D MAINTENANCE PO) Take by mouth daily.      . Potassium 99 MG TABS Take 99 mg by mouth as needed.      . Saw Palmetto 450 MG CAPS Take 2 capsules by mouth. 2 caplets 3xday      . Selenium (SELENICAPS-200) 200 MCG CAPS Take 2 capsules by mouth every other day.      . vitamin C (ASCORBIC ACID) 500 MG tablet Take 500 mg by mouth daily. 2 tablets daily      . ZETIA 10 MG tablet TAKE ONE TABLET BY MOUTH EVERY DAY  30 each  3  . ZINC ACETATE, ORAL, PO Take 1 mg by mouth every other day.      . nitroGLYCERIN (NITROSTAT) 0.4 MG SL tablet Place 0.4 mg under the tongue every 5 (five) minutes as needed.       No current facility-administered medications for this visit.    Review of Systems Review of Systems  Constitutional: Negative for activity change.  Respiratory: Positive for shortness of breath (mild with certain activities.).   Gastrointestinal:   Negative for constipation.  Genitourinary: Positive for difficulty urinating.       Discomfort in the right and left groin areas.    Blood pressure 122/68, pulse 61, temperature 97.2 F (36.2 C), temperature source Temporal, resp. rate 18, height 5' 9.5" (1.765 m), weight 155 lb 6.4 oz (70.489 kg).  Physical Exam Physical Exam  Constitutional: He appears well-developed and well-nourished. No distress.  HENT:  Head: Normocephalic and atraumatic.  Cardiovascular: Normal rate and regular rhythm.   Murmur heard. Pulmonary/Chest: Effort normal and breath sounds normal.  Abdominal: Soft. There is no tenderness.  Small reducible umbilical hernia.  Genitourinary:  Bilateral inguinal bulges that are reducible. Left bulges islarger than the right bulge. Testicles are normal    Data Reviewed Previous notes.  Assessment    1. Symptomatic bilateral inguinal hernias and umbilical hernia. He is interested in having all 3 of these repaired.  2. History of postprocedural urinary  retention and known BPH.  3. Coronary artery disease status post stent placement.     Plan    Preoperative referral to urology to see if we can minimize his potential for postoperative urinary retention.  Preoperative cardiac risk assessment by Dr. Jay Varanasi to make sure he is an appropriate operative risk.  If both these things are done and there are no contraindications to proceeding, we will schedule laparoscopic repair of bilateral inguinal hernias with mesh and umbilical hernia with mesh.  He will need to look into post operative home care as she lives alone.   I have explained the procedure, risks, and aftercare of inguinal and umbilical hernia repairs.  Risks include but are not limited to bleeding, infection, wound problems, anesthesia, recurrence, bladder or intestine injury, urinary retention, testicular dysfunction, chronic pain, mesh problems.  He seems to understand and agrees to proceed.       Tammra Pressman J 01/01/2013, 11:43 AM    

## 2013-01-03 ENCOUNTER — Other Ambulatory Visit (INDEPENDENT_AMBULATORY_CARE_PROVIDER_SITE_OTHER): Payer: Self-pay | Admitting: General Surgery

## 2013-01-03 DIAGNOSIS — R338 Other retention of urine: Secondary | ICD-10-CM

## 2013-01-03 DIAGNOSIS — N400 Enlarged prostate without lower urinary tract symptoms: Secondary | ICD-10-CM

## 2013-01-08 ENCOUNTER — Telehealth (INDEPENDENT_AMBULATORY_CARE_PROVIDER_SITE_OTHER): Payer: Self-pay

## 2013-01-08 NOTE — Telephone Encounter (Signed)
Confirmed pt's appt on 01/23/13 w/ [facility], pending surgical clearance.

## 2013-01-10 ENCOUNTER — Encounter (HOSPITAL_COMMUNITY): Payer: Self-pay | Admitting: Pharmacy Technician

## 2013-01-10 DIAGNOSIS — K4090 Unilateral inguinal hernia, without obstruction or gangrene, not specified as recurrent: Secondary | ICD-10-CM | POA: Diagnosis not present

## 2013-01-10 DIAGNOSIS — N400 Enlarged prostate without lower urinary tract symptoms: Secondary | ICD-10-CM | POA: Diagnosis not present

## 2013-01-11 ENCOUNTER — Telehealth (INDEPENDENT_AMBULATORY_CARE_PROVIDER_SITE_OTHER): Payer: Self-pay

## 2013-01-11 NOTE — Telephone Encounter (Signed)
LMO

## 2013-01-11 NOTE — Telephone Encounter (Signed)
CC rec'd for this pt.

## 2013-01-11 NOTE — Telephone Encounter (Signed)
LM w/ Dr. Hoyle Barr nurse f/u on medical clearance for hernia repair.

## 2013-01-15 ENCOUNTER — Encounter (HOSPITAL_COMMUNITY): Payer: Self-pay

## 2013-01-15 ENCOUNTER — Encounter (HOSPITAL_COMMUNITY)
Admission: RE | Admit: 2013-01-15 | Discharge: 2013-01-15 | Disposition: A | Discharge status: Home / Self Care | Payer: Medicare Other | Source: Ambulatory Visit | Attending: General Surgery | Admitting: General Surgery

## 2013-01-15 DIAGNOSIS — R011 Cardiac murmur, unspecified: Secondary | ICD-10-CM | POA: Diagnosis not present

## 2013-01-15 DIAGNOSIS — I2510 Atherosclerotic heart disease of native coronary artery without angina pectoris: Secondary | ICD-10-CM | POA: Diagnosis not present

## 2013-01-15 DIAGNOSIS — Z7982 Long term (current) use of aspirin: Secondary | ICD-10-CM | POA: Diagnosis not present

## 2013-01-15 DIAGNOSIS — Z01812 Encounter for preprocedural laboratory examination: Secondary | ICD-10-CM | POA: Diagnosis not present

## 2013-01-15 DIAGNOSIS — K429 Umbilical hernia without obstruction or gangrene: Secondary | ICD-10-CM | POA: Diagnosis not present

## 2013-01-15 DIAGNOSIS — J449 Chronic obstructive pulmonary disease, unspecified: Secondary | ICD-10-CM | POA: Diagnosis not present

## 2013-01-15 DIAGNOSIS — Z1331 Encounter for screening for depression: Secondary | ICD-10-CM | POA: Diagnosis not present

## 2013-01-15 DIAGNOSIS — I739 Peripheral vascular disease, unspecified: Secondary | ICD-10-CM | POA: Diagnosis not present

## 2013-01-15 DIAGNOSIS — K4020 Bilateral inguinal hernia, without obstruction or gangrene, not specified as recurrent: Secondary | ICD-10-CM | POA: Diagnosis not present

## 2013-01-15 DIAGNOSIS — Z0000 Encounter for general adult medical examination without abnormal findings: Secondary | ICD-10-CM | POA: Diagnosis not present

## 2013-01-15 DIAGNOSIS — N400 Enlarged prostate without lower urinary tract symptoms: Secondary | ICD-10-CM | POA: Diagnosis not present

## 2013-01-15 DIAGNOSIS — Z87891 Personal history of nicotine dependence: Secondary | ICD-10-CM | POA: Diagnosis not present

## 2013-01-15 DIAGNOSIS — I252 Old myocardial infarction: Secondary | ICD-10-CM | POA: Diagnosis not present

## 2013-01-15 DIAGNOSIS — Z79899 Other long term (current) drug therapy: Secondary | ICD-10-CM | POA: Diagnosis not present

## 2013-01-15 DIAGNOSIS — R339 Retention of urine, unspecified: Secondary | ICD-10-CM | POA: Diagnosis not present

## 2013-01-15 DIAGNOSIS — E7800 Pure hypercholesterolemia, unspecified: Secondary | ICD-10-CM | POA: Diagnosis not present

## 2013-01-15 HISTORY — DX: Complete traumatic metacarpophalangeal amputation of unspecified thumb, initial encounter: S68.019A

## 2013-01-15 LAB — SURGICAL PCR SCREEN: MRSA, PCR: NEGATIVE

## 2013-01-15 NOTE — Patient Instructions (Addendum)
[address] OMERO KOWAL  01/15/2013   Your procedure is scheduled on: 01/18/13  Report to [facility] at 0515 AM.  Call this number if you have problems the morning of surgery [phone]: [phone]   Remember:   Do not eat food or drink liquids After Midnight.     Do not wear jewelry, make-up or nail polish.  Do not wear lotions, powders, or perfumes. You may wear deodorant.  Do not shave 48 hours prior to surgery. Men may shave face and neck.  Do not bring valuables to the hospital.  Contacts, dentures or bridgework may not be worn into surgery.  Leave suitcase in the car. After surgery it may be brought to your room.  For patients admitted to the hospital, checkout time is 11:00 AM the day of discharge.    Please read over the following fact sheets that you were given: MRSA Information.  Birdie Sons, RN  pre op nurse call if needed [phone]    FAILURE TO FOLLOW THESE INSTRUCTIONS MAY RESULT IN CANCELLATION OF YOUR SURGERY   Patient Signature: ___________________________________________

## 2013-01-15 NOTE — Progress Notes (Signed)
Office visit note with cardiac clearance 01/01/13 Dr. Eldridge Dace on chart, EKG 01/01/13 on chart, ECHO 01/28/12 on chart

## 2013-01-15 NOTE — Progress Notes (Signed)
Need orders for pre-op visit 01/15/13 please.

## 2013-01-15 NOTE — Progress Notes (Signed)
Pt refused chest x-ray today at pre-op visit. Will check with Dr. Kevan Ny about lab work done at office.

## 2013-01-16 ENCOUNTER — Other Ambulatory Visit (INDEPENDENT_AMBULATORY_CARE_PROVIDER_SITE_OTHER): Payer: Self-pay | Admitting: General Surgery

## 2013-01-16 ENCOUNTER — Telehealth (INDEPENDENT_AMBULATORY_CARE_PROVIDER_SITE_OTHER): Payer: Self-pay

## 2013-01-16 NOTE — Progress Notes (Signed)
CMET received from 01/15/13 and placed on chart

## 2013-01-16 NOTE — Telephone Encounter (Signed)
LMOV to remind pt to stop Plavix 5 days prior to surgery as previously instructed.

## 2013-01-16 NOTE — Telephone Encounter (Signed)
The pt called to report he stopped his Plavix.  He had it last on the 20th.

## 2013-01-18 ENCOUNTER — Encounter (HOSPITAL_COMMUNITY): Payer: Self-pay | Admitting: Certified Registered Nurse Anesthetist

## 2013-01-18 ENCOUNTER — Ambulatory Visit (HOSPITAL_COMMUNITY)
Admission: RE | Admit: 2013-01-18 | Discharge: 2013-01-19 | Disposition: A | Discharge status: Home / Self Care | Payer: Medicare Other | Source: Ambulatory Visit | Attending: General Surgery | Admitting: General Surgery

## 2013-01-18 ENCOUNTER — Encounter (HOSPITAL_COMMUNITY)
Admission: RE | Disposition: A | Discharge status: Home / Self Care | Payer: Self-pay | Source: Ambulatory Visit | Attending: General Surgery

## 2013-01-18 ENCOUNTER — Encounter (HOSPITAL_COMMUNITY): Payer: Self-pay | Admitting: *Deleted

## 2013-01-18 ENCOUNTER — Ambulatory Visit (HOSPITAL_COMMUNITY): Payer: Medicare Other | Admitting: Certified Registered Nurse Anesthetist

## 2013-01-18 ENCOUNTER — Ambulatory Visit (HOSPITAL_COMMUNITY): Payer: Medicare Other

## 2013-01-18 DIAGNOSIS — E7800 Pure hypercholesterolemia, unspecified: Secondary | ICD-10-CM | POA: Insufficient documentation

## 2013-01-18 DIAGNOSIS — J449 Chronic obstructive pulmonary disease, unspecified: Secondary | ICD-10-CM | POA: Insufficient documentation

## 2013-01-18 DIAGNOSIS — K4020 Bilateral inguinal hernia, without obstruction or gangrene, not specified as recurrent: Secondary | ICD-10-CM | POA: Insufficient documentation

## 2013-01-18 DIAGNOSIS — I739 Peripheral vascular disease, unspecified: Secondary | ICD-10-CM | POA: Insufficient documentation

## 2013-01-18 DIAGNOSIS — I252 Old myocardial infarction: Secondary | ICD-10-CM | POA: Insufficient documentation

## 2013-01-18 DIAGNOSIS — K429 Umbilical hernia without obstruction or gangrene: Secondary | ICD-10-CM | POA: Insufficient documentation

## 2013-01-18 DIAGNOSIS — J4489 Other specified chronic obstructive pulmonary disease: Secondary | ICD-10-CM | POA: Insufficient documentation

## 2013-01-18 DIAGNOSIS — N400 Enlarged prostate without lower urinary tract symptoms: Secondary | ICD-10-CM | POA: Insufficient documentation

## 2013-01-18 DIAGNOSIS — R339 Retention of urine, unspecified: Secondary | ICD-10-CM | POA: Diagnosis not present

## 2013-01-18 DIAGNOSIS — Z01812 Encounter for preprocedural laboratory examination: Secondary | ICD-10-CM | POA: Insufficient documentation

## 2013-01-18 DIAGNOSIS — Z87891 Personal history of nicotine dependence: Secondary | ICD-10-CM | POA: Insufficient documentation

## 2013-01-18 DIAGNOSIS — Z01818 Encounter for other preprocedural examination: Secondary | ICD-10-CM | POA: Diagnosis not present

## 2013-01-18 DIAGNOSIS — Z7982 Long term (current) use of aspirin: Secondary | ICD-10-CM | POA: Insufficient documentation

## 2013-01-18 DIAGNOSIS — K4090 Unilateral inguinal hernia, without obstruction or gangrene, not specified as recurrent: Secondary | ICD-10-CM | POA: Diagnosis not present

## 2013-01-18 DIAGNOSIS — R011 Cardiac murmur, unspecified: Secondary | ICD-10-CM | POA: Insufficient documentation

## 2013-01-18 DIAGNOSIS — I2510 Atherosclerotic heart disease of native coronary artery without angina pectoris: Secondary | ICD-10-CM | POA: Insufficient documentation

## 2013-01-18 DIAGNOSIS — Z79899 Other long term (current) drug therapy: Secondary | ICD-10-CM | POA: Insufficient documentation

## 2013-01-18 HISTORY — PX: INGUINAL HERNIA REPAIR: SHX194

## 2013-01-18 HISTORY — PX: HERNIA REPAIR: SHX51

## 2013-01-18 HISTORY — PX: UMBILICAL HERNIA REPAIR: SHX196

## 2013-01-18 HISTORY — PX: INSERTION OF MESH: SHX5868

## 2013-01-18 LAB — CBC WITH DIFFERENTIAL/PLATELET
Basophils Absolute: 0 10*3/uL (ref 0.0–0.1)
Basophils Relative: 1 % (ref 0–1)
HCT: 41.1 % (ref 39.0–52.0)
Hemoglobin: 14.3 g/dL (ref 13.0–17.0)
Lymphocytes Relative: 27 % (ref 12–46)
MCHC: 34.8 g/dL (ref 30.0–36.0)
Monocytes Absolute: 0.3 10*3/uL (ref 0.1–1.0)
Monocytes Relative: 7 % (ref 3–12)
Neutro Abs: 2.7 10*3/uL (ref 1.7–7.7)
Neutrophils Relative %: 62 % (ref 43–77)
WBC: 4.4 10*3/uL (ref 4.0–10.5)

## 2013-01-18 LAB — PROTIME-INR: INR: 0.99 (ref 0.00–1.49)

## 2013-01-18 SURGERY — REPAIR, HERNIA, INGUINAL, BILATERAL, LAPAROSCOPIC
Anesthesia: General | Site: Groin | Wound class: Clean

## 2013-01-18 MED ORDER — LACTATED RINGERS IV SOLN
INTRAVENOUS | Status: DC | PRN
  Administered 2013-01-18: 07:00:00 via INTRAVENOUS

## 2013-01-18 MED ORDER — ONDANSETRON HCL 4 MG PO TABS
4.0000 mg | ORAL_TABLET | Freq: Four times a day (QID) | ORAL | Status: DC | PRN
  Filled 2013-01-18: qty 1

## 2013-01-18 MED ORDER — ONDANSETRON HCL 4 MG/2ML IJ SOLN: 4.0000 mg | Freq: Four times a day (QID) | INTRAMUSCULAR | Status: DC | PRN

## 2013-01-18 MED ORDER — NEOSTIGMINE METHYLSULFATE 1 MG/ML IJ SOLN
INTRAMUSCULAR | Status: DC | PRN
  Administered 2013-01-18: 5 mg via INTRAVENOUS

## 2013-01-18 MED ORDER — BUPIVACAINE-EPINEPHRINE 0.5% -1:200000 IJ SOLN
INTRAMUSCULAR | Status: DC | PRN
  Administered 2013-01-18: 20 mL

## 2013-01-18 MED ORDER — PROPOFOL 10 MG/ML IV BOLUS
INTRAVENOUS | Status: DC | PRN
  Administered 2013-01-18: 130 mg via INTRAVENOUS

## 2013-01-18 MED ORDER — BUPIVACAINE-EPINEPHRINE (PF) 0.5% -1:200000 IJ SOLN
INTRAMUSCULAR | Status: AC
  Filled 2013-01-18: qty 10

## 2013-01-18 MED ORDER — MIDAZOLAM HCL 5 MG/5ML IJ SOLN
INTRAMUSCULAR | Status: DC | PRN
  Administered 2013-01-18: 2 mg via INTRAVENOUS

## 2013-01-18 MED ORDER — KCL-LACTATED RINGERS-D5W 20 MEQ/L IV SOLN
INTRAVENOUS | Status: DC
  Administered 2013-01-18: 15:00:00 via INTRAVENOUS
  Administered 2013-01-18: 50 mL/h via INTRAVENOUS
  Filled 2013-01-18 (×5): qty 1000

## 2013-01-18 MED ORDER — LIDOCAINE HCL 4 % MT SOLN
OROMUCOSAL | Status: DC | PRN
  Administered 2013-01-18: 4 mL via TOPICAL

## 2013-01-18 MED ORDER — FENTANYL CITRATE 0.05 MG/ML IJ SOLN
INTRAMUSCULAR | Status: DC | PRN
  Administered 2013-01-18: 50 ug via INTRAVENOUS
  Administered 2013-01-18 (×4): 25 ug via INTRAVENOUS

## 2013-01-18 MED ORDER — GLYCOPYRROLATE 0.2 MG/ML IJ SOLN
INTRAMUSCULAR | Status: DC | PRN
  Administered 2013-01-18 (×2): 0.2 mg via INTRAVENOUS
  Administered 2013-01-18: .8 mg via INTRAVENOUS

## 2013-01-18 MED ORDER — ACETAMINOPHEN 10 MG/ML IV SOLN
INTRAVENOUS | Status: AC
  Filled 2013-01-18: qty 100

## 2013-01-18 MED ORDER — ROCURONIUM BROMIDE 100 MG/10ML IV SOLN
INTRAVENOUS | Status: DC | PRN
  Administered 2013-01-18: 40 mg via INTRAVENOUS

## 2013-01-18 MED ORDER — ONDANSETRON HCL 4 MG/2ML IJ SOLN
INTRAMUSCULAR | Status: DC | PRN
  Administered 2013-01-18: 4 mg via INTRAVENOUS

## 2013-01-18 MED ORDER — OXYCODONE HCL 5 MG PO TABS: 5.0000 mg | ORAL_TABLET | ORAL | Status: DC | PRN

## 2013-01-18 MED ORDER — ACETAMINOPHEN 10 MG/ML IV SOLN
INTRAVENOUS | Status: DC | PRN
  Administered 2013-01-18: 1000 mg via INTRAVENOUS

## 2013-01-18 MED ORDER — PHENYLEPHRINE HCL 10 MG/ML IJ SOLN
10.0000 mg | INTRAMUSCULAR | Status: DC | PRN
  Administered 2013-01-18: 50 ug/min via INTRAVENOUS

## 2013-01-18 MED ORDER — PROMETHAZINE HCL 25 MG/ML IJ SOLN: 6.2500 mg | INTRAMUSCULAR | Status: DC | PRN

## 2013-01-18 MED ORDER — DEXAMETHASONE SODIUM PHOSPHATE 10 MG/ML IJ SOLN
INTRAMUSCULAR | Status: DC | PRN
  Administered 2013-01-18: 10 mg via INTRAVENOUS

## 2013-01-18 MED ORDER — FENTANYL CITRATE 0.05 MG/ML IJ SOLN
25.0000 ug | INTRAMUSCULAR | Status: DC | PRN
  Administered 2013-01-18: 50 ug via INTRAVENOUS

## 2013-01-18 MED ORDER — CEFAZOLIN SODIUM-DEXTROSE 2-3 GM-% IV SOLR
2.0000 g | INTRAVENOUS | Status: AC
  Administered 2013-01-18: 2 g via INTRAVENOUS

## 2013-01-18 MED ORDER — FENTANYL CITRATE 0.05 MG/ML IJ SOLN
INTRAMUSCULAR | Status: AC
  Filled 2013-01-18: qty 2

## 2013-01-18 MED ORDER — KETOROLAC TROMETHAMINE 30 MG/ML IJ SOLN
15.0000 mg | Freq: Once | INTRAMUSCULAR | Status: AC | PRN
  Administered 2013-01-18: 30 mg via INTRAVENOUS

## 2013-01-18 MED ORDER — NITROGLYCERIN 0.4 MG SL SUBL: 0.4000 mg | SUBLINGUAL_TABLET | SUBLINGUAL | Status: DC | PRN

## 2013-01-18 MED ORDER — SUCCINYLCHOLINE CHLORIDE 20 MG/ML IJ SOLN
INTRAMUSCULAR | Status: DC | PRN
  Administered 2013-01-18: 100 mg via INTRAVENOUS

## 2013-01-18 MED ORDER — MORPHINE SULFATE 10 MG/ML IJ SOLN: 2.0000 mg | INTRAMUSCULAR | Status: DC | PRN

## 2013-01-18 MED ORDER — KETOROLAC TROMETHAMINE 30 MG/ML IJ SOLN
INTRAMUSCULAR | Status: AC
  Filled 2013-01-18: qty 1

## 2013-01-18 MED ORDER — PHENYLEPHRINE HCL 10 MG/ML IJ SOLN
INTRAMUSCULAR | Status: DC | PRN
  Administered 2013-01-18: 80 ug via INTRAVENOUS

## 2013-01-18 MED ORDER — 0.9 % SODIUM CHLORIDE (POUR BTL) OPTIME
TOPICAL | Status: DC | PRN
  Administered 2013-01-18: 1000 mL

## 2013-01-18 MED ORDER — CEFAZOLIN SODIUM-DEXTROSE 2-3 GM-% IV SOLR
INTRAVENOUS | Status: AC
  Filled 2013-01-18: qty 50

## 2013-01-18 SURGICAL SUPPLY — 53 items
BENZOIN TINCTURE PRP APPL 2/3 (GAUZE/BANDAGES/DRESSINGS) ×3 IMPLANT
BLADE HEX COATED 2.75 (ELECTRODE) ×3 IMPLANT
BLADE SURG SZ10 CARB STEEL (BLADE) ×6 IMPLANT
CABLE HIGH FREQUENCY MONO STRZ (ELECTRODE) IMPLANT
CHLORAPREP W/TINT 26ML (MISCELLANEOUS) ×3 IMPLANT
CLOTH BEACON ORANGE TIMEOUT ST (SAFETY) ×3 IMPLANT
DECANTER SPIKE VIAL GLASS SM (MISCELLANEOUS) ×3 IMPLANT
DISSECT BALLN SPACEMKR + OVL (BALLOONS) ×3
DISSECTOR BALLN SPACEMKR + OVL (BALLOONS) ×2 IMPLANT
DISSECTOR BLUNT TIP ENDO 5MM (MISCELLANEOUS) ×3 IMPLANT
DRAPE INCISE IOBAN 66X45 STRL (DRAPES) ×3 IMPLANT
DRAPE LAPAROSCOPIC ABDOMINAL (DRAPES) ×3 IMPLANT
DRAPE LAPAROTOMY T 102X78X121 (DRAPES) ×3 IMPLANT
DRAPE UTILITY XL STRL (DRAPES) ×3 IMPLANT
DRSG TEGADERM 2-3/8X2-3/4 SM (GAUZE/BANDAGES/DRESSINGS) ×6 IMPLANT
DRSG TEGADERM 4X4.75 (GAUZE/BANDAGES/DRESSINGS) ×3 IMPLANT
ELECT REM PT RETURN 9FT ADLT (ELECTROSURGICAL) ×3
ELECTRODE REM PT RTRN 9FT ADLT (ELECTROSURGICAL) ×2 IMPLANT
GLOVE BIOGEL PI IND STRL 7.0 (GLOVE) ×2 IMPLANT
GLOVE BIOGEL PI INDICATOR 7.0 (GLOVE) ×1
GLOVE ECLIPSE 8.0 STRL XLNG CF (GLOVE) ×3 IMPLANT
GLOVE INDICATOR 8.0 STRL GRN (GLOVE) ×6 IMPLANT
GOWN STRL NON-REIN LRG LVL3 (GOWN DISPOSABLE) ×3 IMPLANT
GOWN STRL REIN XL XLG (GOWN DISPOSABLE) ×6 IMPLANT
KIT BASIN OR (CUSTOM PROCEDURE TRAY) ×3 IMPLANT
MARKER SKIN DUAL TIP RULER LAB (MISCELLANEOUS) ×3 IMPLANT
MESH BARD SOFT 3X6IN (Mesh General) ×3 IMPLANT
MESH PARIETEX 6X6 (Mesh General) ×6 IMPLANT
NEEDLE HYPO 22GX1.5 SAFETY (NEEDLE) ×3 IMPLANT
NEEDLE INSUFFLATION 14GA 120MM (NEEDLE) IMPLANT
NS IRRIG 1000ML POUR BTL (IV SOLUTION) ×3 IMPLANT
PACK BASIC VI WITH GOWN DISP (CUSTOM PROCEDURE TRAY) ×3 IMPLANT
PENCIL BUTTON HOLSTER BLD 10FT (ELECTRODE) ×3 IMPLANT
SCISSORS LAP 5X35 DISP (ENDOMECHANICALS) IMPLANT
SET IRRIG TUBING LAPAROSCOPIC (IRRIGATION / IRRIGATOR) IMPLANT
SOL PREP POV-IOD 16OZ 10% (MISCELLANEOUS) ×3 IMPLANT
SOLUTION ANTI FOG 6CC (MISCELLANEOUS) ×3 IMPLANT
SPONGE GAUZE 4X4 12PLY (GAUZE/BANDAGES/DRESSINGS) ×3 IMPLANT
SPONGE LAP 4X18 X RAY DECT (DISPOSABLE) IMPLANT
STRIP CLOSURE SKIN 1/2X4 (GAUZE/BANDAGES/DRESSINGS) ×3 IMPLANT
SUT MNCRL AB 4-0 PS2 18 (SUTURE) ×3 IMPLANT
SUT PROLENE 0 SH 30 (SUTURE) ×3 IMPLANT
SUT SURG 0 T 19/GS 22 1969 62 (SUTURE) ×3 IMPLANT
SUT VIC AB 3-0 SH 27 (SUTURE) ×1
SUT VIC AB 3-0 SH 27XBRD (SUTURE) ×2 IMPLANT
SYR BULB IRRIGATION 50ML (SYRINGE) ×3 IMPLANT
SYR CONTROL 10ML LL (SYRINGE) ×3 IMPLANT
TACKER HERNIA 5MM 55CML ABS (Staple) ×3 IMPLANT
TOWEL OR 17X26 10 PK STRL BLUE (TOWEL DISPOSABLE) ×3 IMPLANT
TRAY LAP CHOLE (CUSTOM PROCEDURE TRAY) ×3 IMPLANT
TROCAR CANNULA W/PORT DUAL 5MM (MISCELLANEOUS) ×3 IMPLANT
TUBING INSUFFLATION 10FT LAP (TUBING) ×3 IMPLANT
YANKAUER SUCT BULB TIP 10FT TU (MISCELLANEOUS) ×3 IMPLANT

## 2013-01-18 NOTE — Anesthesia Preprocedure Evaluation (Addendum)
Anesthesia Evaluation  Patient identified by MRN, date of birth, ID band Patient awake    Reviewed: Allergy & Precautions, H&P , NPO status , Patient's Chart, lab work & pertinent test results  Airway Mallampati: II TM Distance: <3 FB Neck ROM: Full    Dental no notable dental hx.    Pulmonary COPDformer smoker,  breath sounds clear to auscultation  Pulmonary exam normal       Cardiovascular + CAD and + Past MI III+ Valvular Problems/Murmurs AS Rhythm:Regular Rate:Normal + Systolic murmurs Severe AS AVA < 1.0   Neuro/Psych negative neurological ROS  negative psych ROS   GI/Hepatic negative GI ROS, Neg liver ROS,   Endo/Other  negative endocrine ROS  Renal/GU negative Renal ROS  negative genitourinary   Musculoskeletal negative musculoskeletal ROS (+)   Abdominal   Peds negative pediatric ROS (+)  Hematology negative hematology ROS (+)   Anesthesia Other Findings   Reproductive/Obstetrics negative OB ROS                         Anesthesia Physical Anesthesia Plan  ASA: III  Anesthesia Plan: General   Post-op Pain Management:    Induction: Intravenous  Airway Management Planned: Oral ETT  Additional Equipment:   Intra-op Plan:   Post-operative Plan: Extubation in OR  Informed Consent: I have reviewed the patients History and Physical, chart, labs and discussed the procedure including the risks, benefits and alternatives for the proposed anesthesia with the patient or authorized representative who has indicated his/her understanding and acceptance.   Dental advisory given  Plan Discussed with: CRNA and Surgeon  Anesthesia Plan Comments:         Anesthesia Quick Evaluation

## 2013-01-18 NOTE — Preoperative (Signed)
Beta Blockers   Reason not to administer Beta Blockers:Not Applicable 

## 2013-01-18 NOTE — H&P (View-Only) (Signed)
Patient ID: Darin Simmons, male   DOB: 11/07/1941, 72 y.o.   MRN: [id number]  Chief Complaint  Patient presents with  . Follow-up    eval hernia    HPI Darin Simmons is a 72 y.o. male.   HPI  He is well known to me. I saw him 9 months ago at which time he was noted to have a symptomatic left inguinal hernia, an asymptomatic right inguinal hernia, and an asymptomatic umbilical hernia. He did not want to have the repair during the summertime. He presents today to discuss repair. However, the right inguinal hernia in the umbilical hernia had both become symptomatic. He is interested in having all 3 hernias repaired area he does have a significant problem with BPH symptoms and has had urinary retention following two previous procedures requiring a Foley catheter.  Past Medical History  Diagnosis Date  . Hypercholesterolemia   . CAD (coronary artery disease)   . Aortic stenosis   . Psoriasis   . BPH (benign prostatic hyperplasia)   . PVD (peripheral vascular disease)   . Heart murmur   . Heart attack     Past Surgical History  Procedure Laterality Date  . Heart stent      Family History  Problem Relation Age of Onset  . Heart disease Father     Social History History  Substance Use Topics  . Smoking status: Former Games developer  . Smokeless tobacco: Not on file  . Alcohol Use: No    Allergies  Allergen Reactions  . Diphenhydramine Hcl     Shaky legs    Current Outpatient Prescriptions  Medication Sig Dispense Refill  . aspirin 81 MG tablet Take 81 mg by mouth daily. 2 tablets in the morning, 2 tablets in the evening.      Marland Kitchen atorvastatin (LIPITOR) 80 MG tablet       . B Complex Vitamins (VITAMIN B COMPLEX PO) Take by mouth daily.      . clopidogrel (PLAVIX) 75 MG tablet Take 75 mg by mouth daily.      . fish oil-omega-3 fatty acids 1000 MG capsule Take 2 g by mouth daily.      Marland Kitchen GLUCOSAMINE CHONDROITIN COMPLX PO Take 2,000 mg by mouth every other day.      . Magnesium 400 MG  CAPS Take by mouth as needed.      . Nutritional Supplements (VITAMIN D MAINTENANCE PO) Take by mouth daily.      . Potassium 99 MG TABS Take 99 mg by mouth as needed.      . Saw Palmetto 450 MG CAPS Take 2 capsules by mouth. 2 caplets 3xday      . Selenium (SELENICAPS-200) 200 MCG CAPS Take 2 capsules by mouth every other day.      . vitamin C (ASCORBIC ACID) 500 MG tablet Take 500 mg by mouth daily. 2 tablets daily      . ZETIA 10 MG tablet TAKE ONE TABLET BY MOUTH EVERY DAY  30 each  3  . ZINC ACETATE, ORAL, PO Take 1 mg by mouth every other day.      . nitroGLYCERIN (NITROSTAT) 0.4 MG SL tablet Place 0.4 mg under the tongue every 5 (five) minutes as needed.       No current facility-administered medications for this visit.    Review of Systems Review of Systems  Constitutional: Negative for activity change.  Respiratory: Positive for shortness of breath (mild with certain activities.).   Gastrointestinal:  Negative for constipation.  Genitourinary: Positive for difficulty urinating.       Discomfort in the right and left groin areas.    Blood pressure 122/68, pulse 61, temperature 97.2 F (36.2 C), temperature source Temporal, resp. rate 18, height 5' 9.5" (1.765 m), weight 155 lb 6.4 oz (70.489 kg).  Physical Exam Physical Exam  Constitutional: He appears well-developed and well-nourished. No distress.  HENT:  Head: Normocephalic and atraumatic.  Cardiovascular: Normal rate and regular rhythm.   Murmur heard. Pulmonary/Chest: Effort normal and breath sounds normal.  Abdominal: Soft. There is no tenderness.  Small reducible umbilical hernia.  Genitourinary:  Bilateral inguinal bulges that are reducible. Left bulges islarger than the right bulge. Testicles are normal    Data Reviewed Previous notes.  Assessment    1. Symptomatic bilateral inguinal hernias and umbilical hernia. He is interested in having all 3 of these repaired.  2. History of postprocedural urinary  retention and known BPH.  3. Coronary artery disease status post stent placement.     Plan    Preoperative referral to urology to see if we can minimize his potential for postoperative urinary retention.  Preoperative cardiac risk assessment by Dr. Everette Rank to make sure he is an appropriate operative risk.  If both these things are done and there are no contraindications to proceeding, we will schedule laparoscopic repair of bilateral inguinal hernias with mesh and umbilical hernia with mesh.  He will need to look into post operative home care as she lives alone.   I have explained the procedure, risks, and aftercare of inguinal and umbilical hernia repairs.  Risks include but are not limited to bleeding, infection, wound problems, anesthesia, recurrence, bladder or intestine injury, urinary retention, testicular dysfunction, chronic pain, mesh problems.  He seems to understand and agrees to proceed.       Chaela Branscum J 01/01/2013, 11:43 AM

## 2013-01-18 NOTE — Anesthesia Postprocedure Evaluation (Signed)
  Anesthesia Post-op Note  Patient: Darin Simmons  Procedure(s) Performed: Procedure(s) (LRB): LAPAROSCOPIC BILATERAL INGUINAL HERNIA REPAIR (Bilateral) INSERTION OF MESH (Bilateral) HERNIA REPAIR UMBILICAL ADULT (N/A)  Patient Location: PACU  Anesthesia Type: General  Level of Consciousness: awake and alert   Airway and Oxygen Therapy: Patient Spontanous Breathing  Post-op Pain: mild  Post-op Assessment: Post-op Vital signs reviewed, Patient's Cardiovascular Status Stable, Respiratory Function Stable, Patent Airway and No signs of Nausea or vomiting  Last Vitals:  Filed Vitals:   01/18/13 0945  BP: 132/77  Pulse: 63  Temp:   Resp: 13    Post-op Vital Signs: stable   Complications: No apparent anesthesia complications

## 2013-01-18 NOTE — Transfer of Care (Signed)
Immediate Anesthesia Transfer of Care Note  Patient: Darin Simmons  Procedure(s) Performed: Procedure(s) (LRB): LAPAROSCOPIC BILATERAL INGUINAL HERNIA REPAIR (Bilateral) INSERTION OF MESH (Bilateral) HERNIA REPAIR UMBILICAL ADULT (N/A)  Patient Location: PACU  Anesthesia Type: General  Level of Consciousness: sedated, patient cooperative and responds to stimulaton  Airway & Oxygen Therapy: Patient Spontanous Breathing and Patient connected to face mask oxgen  Post-op Assessment: Report given to PACU RN and Post -op Vital signs reviewed and stable  Post vital signs: Reviewed and stable  Complications: No apparent anesthesia complications

## 2013-01-18 NOTE — Interval H&P Note (Signed)
History and Physical Interval Note:  01/18/2013 7:23 AM  Darin Simmons  has presented today for surgery, with the diagnosis of bilateral inguinal hernia and umbilical hernia.  He has been cleared by Cardiology for the procedure and Urology has recommended placing a foley catheter and then removing it on POD#1.  The various methods of treatment have been discussed with the patient and family. After consideration of risks, benefits and other options for treatment, the patient has consented to  Procedure(s): LAPAROSCOPIC BILATERAL INGUINAL HERNIA REPAIR (Bilateral) INSERTION OF MESH (Bilateral) HERNIA REPAIR UMBILICAL ADULT (N/A) as a surgical intervention .  The patient's history has been reviewed, patient examined, no change in status, stable for surgery.  I have reviewed the patient's chart and labs.  Questions were answered to the patient's satisfaction.     Felton Buczynski [facility]
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
## 2013-01-19 ENCOUNTER — Encounter (HOSPITAL_COMMUNITY): Payer: Self-pay | Admitting: General Surgery

## 2013-01-19 MED ORDER — OXYCODONE HCL 5 MG PO TABS: 5.0000 mg | ORAL_TABLET | ORAL | Status: DC | PRN

## 2013-01-19 NOTE — Progress Notes (Signed)
1 Day Post-Op  Subjective: Minimal pain.  Foley removed at 0600.  Objective: Vital signs in last 24 hours: Temp:  [97.3 F (36.3 C)-98 F (36.7 C)] 97.4 F (36.3 C) (02/28 0557) Pulse Rate:  [50-72] 61 (02/28 0557) Resp:  [10-20] 18 (02/28 0557) BP: (103-132)/(57-77) 116/71 mmHg (02/28 0557) SpO2:  [96 %-100 %] 99 % (02/28 0557) Weight:  [169 lb (76.658 kg)] 169 lb (76.658 kg) (02/27 1128)    Intake/Output from previous day: 02/27 0701 - 02/28 0700 In: 4329.2 [P.O.:2040; I.V.:2289.2] Out: 4800 [Urine:4800] Intake/Output this shift:    PE: General- In NAD Abdomen-soft, dressings dry, minimal swelling  Lab Results:   Recent Labs  01/18/13 0600  WBC 4.4  HGB 14.3  HCT 41.1  PLT 143*   BMET No results found for this basename: NA, K, CL, CO2, GLUCOSE, BUN, CREATININE, CALCIUM,  in the last 72 hours PT/INR  Recent Labs  01/18/13 0600  LABPROT 13.0  INR 0.99   Comprehensive Metabolic Panel:    Component Value Date/Time   NA 141 04/15/2011 0500   K 3.8 04/15/2011 0500   CL 106 04/15/2011 0500   CO2 27 04/15/2011 0500   BUN 8 04/15/2011 0500   CREATININE 0.58 04/15/2011 0500   GLUCOSE 91 04/15/2011 0500   CALCIUM 9.0 04/15/2011 0500     Studies/Results: Dg Chest 2 View  01/18/2013  *RADIOLOGY REPORT*  Clinical Data: Preoperative study.  CHEST - 2 VIEW  Comparison: 03/02/2007  Findings: The heart size and pulmonary vascularity are normal. The lungs appear clear and expanded without focal air space disease or consolidation. No blunting of the costophrenic angles.  No pneumothorax.  Mediastinal contours appear intact.  Pectus exclude bottom deformity.  Mild degenerative changes in the spine.  No significant change since previous study.  IMPRESSION: No evidence of active pulmonary disease.   Original Report Authenticated By: Burman Nieves, M.D.     Anti-infectives: Anti-infectives   Start     Dose/Rate Route Frequency Ordered Stop   01/18/13 0531  ceFAZolin (ANCEF)  IVPB 2 g/50 mL premix     2 g 100 mL/hr over 30 Minutes Intravenous On call to O.R. 01/18/13 0531 01/18/13 0739      Assessment Principal Problem:   Bilateral inguinal hernia-s/p laparoscopic repair with mesh Active Problems:   Umbilical hernia-s/p repair with mesh Doing well overall. Hx of post-procedure urinary retention- foley removed.    LOS: 1 day   Plan: Will see if he can void.  Will discharge later today without or with a foley depending upon if he can void.   Adolph Pollack 01/19/2013

## 2013-01-19 NOTE — Progress Notes (Addendum)
Notified MD Rosenbower that patient has voided with no difficulty, order received to discharge patient home. Stanford Breed RN 01-19-2013 8:40am

## 2013-01-19 NOTE — Progress Notes (Signed)
Discharge instructions reviewed with patient, vital signs are stable, incision within normal limits, bowel sounds hypoactive, foley removed this morning and patient has voided twice with no difficulty, patient tolerated diet without any complaints of nausea or vomiting, prescription given and patient is to follow up with MD and already has appointment set up Stanford Breed RN 01-19-2013 9:24am

## 2013-01-29 ENCOUNTER — Encounter (INDEPENDENT_AMBULATORY_CARE_PROVIDER_SITE_OTHER): Payer: Self-pay | Admitting: General Surgery

## 2013-01-29 ENCOUNTER — Ambulatory Visit (INDEPENDENT_AMBULATORY_CARE_PROVIDER_SITE_OTHER): Payer: Medicare Other | Admitting: General Surgery

## 2013-01-29 VITALS — BP 136/92 | HR 73 | Temp 97.5°F | Resp 16 | Ht 69.5 in | Wt 155.0 lb

## 2013-01-29 DIAGNOSIS — Z9889 Other specified postprocedural states: Secondary | ICD-10-CM
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
## 2013-01-29 NOTE — Patient Instructions (Signed)
Continue light activities. 

## 2013-03-05 ENCOUNTER — Encounter (INDEPENDENT_AMBULATORY_CARE_PROVIDER_SITE_OTHER): Payer: Self-pay | Admitting: General Surgery

## 2013-03-05 ENCOUNTER — Ambulatory Visit (INDEPENDENT_AMBULATORY_CARE_PROVIDER_SITE_OTHER): Payer: Medicare Other | Admitting: General Surgery

## 2013-03-05 VITALS — BP 146/82 | HR 64 | Temp 97.3°F | Resp 20 | Ht 70.0 in | Wt 157.0 lb

## 2013-03-05 DIAGNOSIS — Z9889 Other specified postprocedural states: Secondary | ICD-10-CM

## 2013-03-05 DIAGNOSIS — Z1211 Encounter for screening for malignant neoplasm of colon: Secondary | ICD-10-CM | POA: Diagnosis not present

## 2013-03-05 NOTE — Patient Instructions (Signed)
Activities as tolerated.  Re-evaluation in 6-8 weeks.

## 2013-03-05 NOTE — Progress Notes (Signed)
Procedure:  Laparoscopic bilateral inguinal hernia repairs with mesh, umbilical hernia repair with mesh.  Date: 01/18/2013  Pathology: na  History:  Since I saw him last, he tripped over his catheter and fell down 3 flights of stairs. Following that he noted a burning type pain in the left inguinal area. Subsequently he is noticed a painful knot there. The pain is improving somewhat.  Exam: General- Is in NAD. Abdomen-incisions are clean and intact, umbilical hernia repair feels solid.  GU-right inguinal floors solid. Left inguinal floor demonstrates a tender bulge that does not move with cough. It is quite firm.  Assessment:  Possible recurrent left inguinal hernia following a fall in the early postoperative period versus acute muscular tear and swelling. Pain has been improving in that area since the fall.  Plan:  Activities as tolerated. Repeat visit and examination in 6-8 weeks.

## 2013-03-07 DIAGNOSIS — K921 Melena: Secondary | ICD-10-CM | POA: Diagnosis not present

## 2013-04-18 ENCOUNTER — Encounter (INDEPENDENT_AMBULATORY_CARE_PROVIDER_SITE_OTHER): Payer: Self-pay | Admitting: General Surgery

## 2013-04-18 ENCOUNTER — Ambulatory Visit (INDEPENDENT_AMBULATORY_CARE_PROVIDER_SITE_OTHER): Payer: Medicare Other | Admitting: General Surgery

## 2013-04-18 VITALS — BP 140/82 | HR 72 | Resp 16 | Ht 70.0 in | Wt 155.6 lb

## 2013-04-18 DIAGNOSIS — Z9889 Other specified postprocedural states: Secondary | ICD-10-CM

## 2013-04-18 NOTE — Patient Instructions (Signed)
Call if you notice increased swelling and pain in the left groin area.

## 2013-04-18 NOTE — Progress Notes (Signed)
Procedure:  Laparoscopic bilateral inguinal hernia repairs with mesh, umbilical hernia repair with mesh.  Date: 01/18/2013  Pathology: na  History:  Subjectively, he is doing much better. He is doing full activities with no further groin pain.  Exam: General- Is in NAD. Abdomen-incisions are clean and intact, umbilical hernia repair feels solid.  GU-right inguinal floors solid. Left inguinal floor demonstrates a firm bulge that does not move with cough and is smaller than the last exam.  It feels like some of the mesh is bowing out.  Assessment: Acute disruption of mesh following LIH repair; mesh is palpable and likely always will be.  However, I do not think he has a recurrence.  Plan:  Activities as tolerated. Return prn.

## 2013-06-28 DIAGNOSIS — H2510 Age-related nuclear cataract, unspecified eye: Secondary | ICD-10-CM | POA: Diagnosis not present

## 2013-06-28 DIAGNOSIS — H40039 Anatomical narrow angle, unspecified eye: Secondary | ICD-10-CM | POA: Diagnosis not present

## 2013-07-04 DIAGNOSIS — H2510 Age-related nuclear cataract, unspecified eye: Secondary | ICD-10-CM | POA: Diagnosis not present

## 2013-08-23 DIAGNOSIS — Z23 Encounter for immunization: Secondary | ICD-10-CM | POA: Diagnosis not present

## 2013-08-30 ENCOUNTER — Other Ambulatory Visit: Payer: Self-pay | Admitting: Cardiology

## 2013-08-30 MED ORDER — CLOPIDOGREL BISULFATE 75 MG PO TABS: 75.0000 mg | ORAL_TABLET | Freq: Every day | ORAL | Status: DC

## 2014-01-01 ENCOUNTER — Ambulatory Visit (INDEPENDENT_AMBULATORY_CARE_PROVIDER_SITE_OTHER): Payer: Medicare Other | Admitting: Interventional Cardiology

## 2014-01-01 ENCOUNTER — Encounter: Payer: Self-pay | Admitting: Interventional Cardiology

## 2014-01-01 VITALS — BP 138/72 | HR 61 | Ht 70.0 in | Wt 182.0 lb

## 2014-01-01 DIAGNOSIS — E782 Mixed hyperlipidemia: Secondary | ICD-10-CM

## 2014-01-01 DIAGNOSIS — I2510 Atherosclerotic heart disease of native coronary artery without angina pectoris: Secondary | ICD-10-CM | POA: Diagnosis not present

## 2014-01-01 DIAGNOSIS — I350 Nonrheumatic aortic (valve) stenosis: Secondary | ICD-10-CM

## 2014-01-01 DIAGNOSIS — I359 Nonrheumatic aortic valve disorder, unspecified: Secondary | ICD-10-CM

## 2014-01-01 NOTE — Patient Instructions (Signed)
Your physician recommends that you continue on your current medications as directed. Please refer to the Current Medication list given to you today.  Your physician wants you to follow-up in: 1 year with Dr. Varanasi. You will receive a reminder letter in the mail two months in advance. If you don't receive a letter, please call our office to schedule the follow-up appointment.  

## 2014-01-01 NOTE — Progress Notes (Signed)
Patient ID: Darin Simmons, male   DOB: [birth year], 73 y.o.   MRN: [id number]    [address] [place], [place]  [address] Phone: [phone] Fax:  [phone]  Date:  01/01/2014   ID:  Darin Simmons, [place] 07-18-1941, MRN [id number]  PCP:  Marjorie Smolder, MD      History of Present Illness: Darin Simmons is a 73 y.o. male who has had CAD, DES to RCA and moderate to severe aortic stenosis. He walks regularly on the treadmill. He has had atypical chest pain or left arm  lasting a few seconds, but this is not related to exertion. He has not had to take NTG. They may have happened 5 x in the past year, lasting seconds only. He went to the [place] and walked a lot there, without problems. No lightheadedness or syncope. No exertional sx. Walks 4x/week, 30 minutes each time at 3 mph.  He felt like he recently did more than he was used to doing.  He felt a little more tiredness of late.  This has happened before and he thinks this will get better.  He continues to be active.       Wt Readings from Last 3 Encounters:  01/01/14 182 lb (82.555 kg)  04/18/13 155 lb 9.6 oz (70.58 kg)  03/05/13 157 lb (71.215 kg)     Past Medical History  Diagnosis Date  . Hypercholesterolemia   . Aortic stenosis   . 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 BPH (benign prostatic hyperplasia)   . PVD (peripheral vascular disease)   . Heart murmur   . Heart attack 2004  . CAD (coronary artery disease) 2004  . 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 atorvastatin (LIPITOR) 80 MG tablet Take 80 mg by mouth every evening.       . B Complex Vitamins (VITAMIN B COMPLEX PO) Take 1 tablet by mouth daily.       Marland Kitchen BREWERS YEAST PO Take 1,000 mg by mouth as needed (stress).      . cholecalciferol (VITAMIN D) 400 UNITS TABS Take 400 Units by mouth daily.      . clopidogrel (PLAVIX) 75 MG tablet Take 1 tablet (75 mg total) by mouth daily.   30 tablet  6  . GLUCOSAMINE SULFATE PO Take 2,000 mg by mouth daily.      . Magnesium 400 MG CAPS Take 400 mg by mouth daily.       . Methylsulfonylmethane (MSM) 1000 MG TABS Take 1 tablet by mouth daily.      . nitroGLYCERIN (NITROSTAT) 0.4 MG SL tablet Place 0.4 mg under the tongue every 5 (five) minutes as needed for chest pain.       . potassium gluconate 595 MG TABS Take 595 mg by mouth every evening.      . Saw Palmetto 450 MG CAPS Take 2 capsules by mouth 2 (two) times daily.       . vitamin C (ASCORBIC ACID) 500 MG tablet Take 500 mg by mouth 2 (two) times daily.       Marland Kitchen zinc gluconate 50 MG tablet Take 50 mg by mouth daily.       No current facility-administered medications for this visit.    Allergies:    Allergies  Allergen Reactions  . Diphenhydramine Hcl     Shaky legs  Social History:  The patient  reports that he quit smoking about 5 years ago. His smoking use included Cigarettes. He has a 50 pack-year smoking history. He has never used smokeless tobacco. He reports that he drinks alcohol. He reports that he does not use illicit drugs.   Family History:  The patient's family history includes Heart disease in his father.   ROS:  Please see the history of present illness.  No nausea, vomiting.  No fevers, chills.  No focal weakness.  No dysuria.    All other systems reviewed and negative.   PHYSICAL EXAM: VS:  BP 138/72  Pulse 61  Ht 5\' 10"  (1.778 m)  Wt 182 lb (82.555 kg)  BMI 26.11 kg/m2 Well nourished, well developed, in no acute distress HEENT: normal Neck: no JVD, no carotid bruits Cardiac:  normal S1, S2; RRR; 3/6 systolic Lungs:  clear to auscultation bilaterally, no wheezing, rhonchi or rales Abd: soft, nontender, no hepatomegaly Ext: no edema Skin: warm and dry Neuro:   no focal abnormalities noted  EKG:  NSR, LVH, TWI laterally; mild inferior ST depression     ASSESSMENT AND PLAN:  1. Aortic stenosis  Moderate to severe by prior echocardiogram  in 3/13. The leaflets did appear to open fairly well. On exam, he has a well-preserved S2. No symptoms of severe aortic stenosis. Continue to monitor. Tolerating exercise for the most part.  He will watch fatigue.  If things get worse, he will let us know.  WOuld consider w/u at that time, first with repeat echo ad then possibly with cath. 2. Hypercholesteremia, pure  Continue Vitamin D Tablet, 1000 UNIT, 1 tablet, Orally, Once a day Continue Atorvastatin Calcium Tablet, 80MG , TAKE ONE TABLET BY MOUTH EVERY DAY Lipids well-controlled.  LDL 68 in 2/14 3. Coronary artery disease  Prior drug-eluting stents in the right coronary artery. These were placed over a year ago. He can safely come off of Plavix for 5 days prior to any surgery. I would keep him on long-term Plavix after the surgery due to the fact that he had multiple overlapping stents and a long area of disease was treated. no angina.    Signed, Mina Marble, MD, [facility] 01/01/2014 9:55 AM

## 2014-01-24 ENCOUNTER — Encounter: Payer: Self-pay | Admitting: Interventional Cardiology

## 2014-01-24 DIAGNOSIS — I359 Nonrheumatic aortic valve disorder, unspecified: Secondary | ICD-10-CM | POA: Diagnosis not present

## 2014-01-24 DIAGNOSIS — I2510 Atherosclerotic heart disease of native coronary artery without angina pectoris: Secondary | ICD-10-CM | POA: Diagnosis not present

## 2014-01-24 DIAGNOSIS — Z1331 Encounter for screening for depression: Secondary | ICD-10-CM | POA: Diagnosis not present

## 2014-01-24 DIAGNOSIS — Z0000 Encounter for general adult medical examination without abnormal findings: Secondary | ICD-10-CM | POA: Diagnosis not present

## 2014-01-24 DIAGNOSIS — Z23 Encounter for immunization: Secondary | ICD-10-CM | POA: Diagnosis not present

## 2014-01-24 DIAGNOSIS — I739 Peripheral vascular disease, unspecified: Secondary | ICD-10-CM | POA: Diagnosis not present

## 2014-01-24 DIAGNOSIS — E7800 Pure hypercholesterolemia, unspecified: Secondary | ICD-10-CM | POA: Diagnosis not present

## 2014-01-29 ENCOUNTER — Other Ambulatory Visit: Payer: Self-pay

## 2014-01-29 MED ORDER — ATORVASTATIN CALCIUM 80 MG PO TABS: 80.0000 mg | ORAL_TABLET | Freq: Every evening | ORAL | Status: DC

## 2014-02-19 ENCOUNTER — Other Ambulatory Visit: Payer: Self-pay

## 2014-02-19 ENCOUNTER — Telehealth: Payer: Self-pay | Admitting: Cardiology

## 2014-02-19 MED ORDER — EZETIMIBE 10 MG PO TABS: 10.0000 mg | ORAL_TABLET | Freq: Every day | ORAL | Status: DC

## 2014-02-19 NOTE — Telephone Encounter (Signed)
Pt calling for refill of Zetia, which is not on med list. Medication is on old list at [facility]. It may have never been transferred over. I will check with pt and if he has been taking it all along we will refill medication.

## 2014-02-19 NOTE — Telephone Encounter (Signed)
Spoke with pt and he has been taking zetia for several years. Refilled medication.

## 2014-03-27 ENCOUNTER — Other Ambulatory Visit: Payer: Self-pay | Admitting: *Deleted

## 2014-03-27 MED ORDER — CLOPIDOGREL BISULFATE 75 MG PO TABS: 75.0000 mg | ORAL_TABLET | Freq: Every day | ORAL | Status: DC

## 2014-04-04 ENCOUNTER — Telehealth: Payer: Self-pay | Admitting: Interventional Cardiology

## 2014-04-04 MED ORDER — CLOPIDOGREL BISULFATE 75 MG PO TABS: 75.0000 mg | ORAL_TABLET | Freq: Every day | ORAL | Status: DC

## 2014-04-04 MED ORDER — ATORVASTATIN CALCIUM 80 MG PO TABS: 80.0000 mg | ORAL_TABLET | Freq: Every evening | ORAL | Status: DC

## 2014-04-04 MED ORDER — EZETIMIBE 10 MG PO TABS: 10.0000 mg | ORAL_TABLET | Freq: Every day | ORAL | Status: DC

## 2014-04-04 NOTE — Telephone Encounter (Signed)
New message     Want to know if he can take a capsule called rejuvenzyme 2cap daily----it is a heart/joint/immune organic enzyme tablet.  Patient is also on plavix.  Also, pt want a 90day supply on all of his medications

## 2014-04-04 NOTE — Telephone Encounter (Signed)
This enzyme is called Rejuvenzyme.  It is an immune support enzyme containing pancreasin, protease, lipase, amylase, rutin, tripsin. Since he is on Plavix and he also takes 4 baby aspirins per day, (not on his medication list), and he knows these enzymes have a slight blood thinning effect, he wanted to know if we think it is ok to take them. Will route to [facility] for advice.  Changed scripts for 90 day supplies to [facility] per patient request.

## 2014-04-05 NOTE — Telephone Encounter (Signed)
LMTRC

## 2014-04-05 NOTE — Telephone Encounter (Signed)
From what information I have found on Rejuvenzyme and its ingredients, it appears to work on preventing platelet aggregation and reducing thrombin formation, but known to what extent.  Based on this, and the fact patient is on dual antiplatelet therapy with plavix and aspirin already, I would not recommend patient take Rejuvenzyme.  There is likely more bleeding risk than benefit with this product in this patient scenario.  Please notify patient.

## 2014-04-05 NOTE — Telephone Encounter (Signed)
Pt.notified

## 2014-04-10 DIAGNOSIS — L821 Other seborrheic keratosis: Secondary | ICD-10-CM | POA: Diagnosis not present

## 2014-04-16 ENCOUNTER — Ambulatory Visit (INDEPENDENT_AMBULATORY_CARE_PROVIDER_SITE_OTHER): Payer: Medicare Other | Admitting: Interventional Cardiology

## 2014-04-16 ENCOUNTER — Encounter: Payer: Self-pay | Admitting: Interventional Cardiology

## 2014-04-16 ENCOUNTER — Telehealth: Payer: Self-pay | Admitting: Interventional Cardiology

## 2014-04-16 ENCOUNTER — Encounter: Payer: Self-pay | Admitting: Cardiology

## 2014-04-16 VITALS — BP 134/64 | HR 60 | Ht 70.0 in | Wt 152.0 lb

## 2014-04-16 DIAGNOSIS — R5383 Other fatigue: Secondary | ICD-10-CM | POA: Diagnosis not present

## 2014-04-16 DIAGNOSIS — I359 Nonrheumatic aortic valve disorder, unspecified: Secondary | ICD-10-CM

## 2014-04-16 DIAGNOSIS — R5381 Other malaise: Secondary | ICD-10-CM | POA: Diagnosis not present

## 2014-04-16 DIAGNOSIS — I2510 Atherosclerotic heart disease of native coronary artery without angina pectoris: Secondary | ICD-10-CM

## 2014-04-16 DIAGNOSIS — R0789 Other chest pain: Secondary | ICD-10-CM | POA: Diagnosis not present

## 2014-04-16 DIAGNOSIS — E782 Mixed hyperlipidemia: Secondary | ICD-10-CM | POA: Diagnosis not present

## 2014-04-16 DIAGNOSIS — I350 Nonrheumatic aortic (valve) stenosis: Secondary | ICD-10-CM

## 2014-04-16 NOTE — Telephone Encounter (Signed)
To triage

## 2014-04-16 NOTE — Telephone Encounter (Signed)
New problem   Pt need to speak to nurse concerning some symptoms he has been having. Please cal pt.

## 2014-04-16 NOTE — Telephone Encounter (Signed)
Spoke with pt and he has had intermittent chest heaviness x 10 days. Last night he went to bed at 8:30 pm and he woke up in the middle of the night to use the restroom and he felt the heaviness in his chest. Pt also feels more fatigued lately. He denies SOB.

## 2014-04-16 NOTE — Progress Notes (Signed)
Patient ID: Darin Simmons, male   DOB: 1941/07/22, 73 y.o.   MRN: [id number] Patient ID: Darin Simmons, male   DOB: 1941/06/30, 73 y.o.   MRN: [id number]    [address], [place], [place]  [address] Phone: [phone] Fax:  [phone]  Date:  04/16/2014   ID:  Darin, Simmons 11-27-1940, MRN [id number]  PCP:  Darin Smolder, Darin Simmons      History of Present Illness: Darin Simmons is a 73 y.o. male who has had CAD, DES to RCA and moderate to severe aortic stenosis. He walks regularly on the treadmill. He has had atypical chest pain or left arm  lasting a few seconds, but this has not happened for a few months.  Exercise is limited by sciatica.  He had a pressure in his his last night at rest.  Not related to exertion.  He is under a lot if stres due to his wife's illlness.    He felt like he recently did more than he was used to doing.  He felt a little more tiredness of late.  This has happened before and he thinks this will get better.  He continues to be active.       Wt Readings from Last 3 Encounters:  04/16/14 152 lb (68.947 kg)  01/01/14 182 lb (82.555 kg)  04/18/13 155 lb 9.6 oz (70.58 kg)     Past Medical History  Diagnosis Date  . Hypercholesterolemia   . Aortic stenosis   . 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 BPH (benign prostatic hyperplasia)   . PVD (peripheral vascular disease)   . Heart murmur   . Heart attack 2004  . CAD (coronary artery disease) 2004  . 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 acetaminophen (TYLENOL) 325 MG tablet Take 650 mg by mouth every 6 (six) hours as needed.      Marland Kitchen atorvastatin (LIPITOR) 80 MG tablet Take 1 tablet (80 mg total) by mouth every evening.  90 tablet  1  . B Complex Vitamins (VITAMIN B COMPLEX PO) Take 1 tablet by mouth daily.       Marland Kitchen BREWERS YEAST PO Take 1,000 mg by mouth as needed (stress).      . cholecalciferol (VITAMIN D) 400  UNITS TABS Take 400 Units by mouth daily.      . clopidogrel (PLAVIX) 75 MG tablet Take 1 tablet (75 mg total) by mouth daily.  90 tablet  1  . ezetimibe (ZETIA) 10 MG tablet Take 1 tablet (10 mg total) by mouth daily.  90 tablet  3  . Flaxseed, Linseed, (FLAX SEED OIL PO) Take by mouth.      Marland Kitchen GLUCOSAMINE SULFATE PO Take 2,000 mg by mouth daily.      . IODINE, KELP, PO Take by mouth.      . Magnesium 400 MG CAPS Take 400 mg by mouth daily.       . Methylsulfonylmethane (MSM) 1000 MG TABS Take 1 tablet by mouth daily.      . nitroGLYCERIN (NITROSTAT) 0.4 MG SL tablet Place 0.4 mg under the tongue every 5 (five) minutes as needed for chest pain.       . potassium gluconate 595 MG TABS Take 595 mg by mouth every evening.      . Saw Palmetto 450 MG CAPS Take 2 capsules by mouth 2 (  two) times daily.       . vitamin C (ASCORBIC ACID) 500 MG tablet Take 500 mg by mouth 2 (two) times daily.       Marland Kitchen zinc gluconate 50 MG tablet Take 50 mg by mouth daily.       No current facility-administered medications for this visit.    Allergies:    Allergies  Allergen Reactions  . Diphenhydramine Hcl     Shaky legs    Social History:  The patient  reports that he quit smoking about 5 years ago. His smoking use included Cigarettes. He has a 50 pack-year smoking history. He has never used smokeless tobacco. He reports that he drinks alcohol. He reports that he does not use illicit drugs.   Family History:  The patient's family history includes Heart disease in his father.   ROS:  Please see the history of present illness.  No nausea, vomiting.  No fevers, chills.  No focal weakness.  No dysuria.    All other systems reviewed and negative.   PHYSICAL EXAM: VS:  BP 134/64  Pulse 60  Ht 5\' 10"  (1.778 m)  Wt 152 lb (68.947 kg)  BMI 21.81 kg/m2 Well nourished, well developed, in no acute distress HEENT: normal Neck: no JVD, no carotid bruits Cardiac:  normal S1, S2; RRR; 3/6 systolic Lungs:  clear to  auscultation bilaterally, no wheezing, rhonchi or rales Abd: soft, nontender, no hepatomegaly Ext: no edema Skin: warm and dry Neuro:   no focal abnormalities noted  EKG:  NSR, LVH, TWI laterally; mild inferior ST depression     ASSESSMENT AND PLAN:  1. Aortic stenosis  Moderate to severe by prior echocardiogram in 3/13. The leaflets did appear to open fairly well. On exam, he has a well-preserved S2. No symptoms of severe aortic stenosis. Continue to monitor. Tolerating exercise for the most part.  He will watch fatigue.  If things get worse, he will let us know.  WOuld consider w/u at that time, first with repeat echo ad then possibly with cath. 2. Hypercholesteremia, pure  Continue Vitamin D Tablet, 1000 UNIT, 1 tablet, Orally, Once a day Continue Atorvastatin Calcium Tablet, 80MG , TAKE ONE TABLET BY MOUTH EVERY DAY Lipids well-controlled.  LDL 68 in 2/14 3. Coronary artery disease  Prior drug-eluting stents in the right coronary artery. These were placed over a year ago. He can safely come off of Plavix for 5 days prior to any surgery. I would keep him on long-term Plavix after the surgery due to the fact that he had multiple overlapping stents and a long area of disease was treated. no angina.   In regards to his Rejuvenzyme suuplement-I explained him that this would potentially increase his risk of bleeding. There were no studies to see whether this is safe to use with Plavix. I would not take him off the Plavix due to the multiple drug-eluting stents which are overlapping. He understands the risk and is still wanting to try this new medicine. He states he is not going to take full dose supplement but only a partial dose. Therefore, he will try it. I asked him to watch for any bleeding side effects.  He also spoke at length about enzymes being used to cure cancer. He feels that [place] states medicine is dominated by [employer]. Herbal remedies or not respected as possible cures.    Chest  pressure: Unclear whether this is really angina.  He has been under a lot of stress. Given  his history of heart disease, will plan for nuclear stress test. T-wave inversions laterally would prevent interpretation of an exercise treadmill test.   Signed, Mina Marble, Darin Simmons, [facility] 04/16/2014 3:51 PM

## 2014-04-16 NOTE — Patient Instructions (Signed)
Your physician has requested that you have en exercise stress myoview. For further information please visit [email or web address]. Please follow instruction sheet, as given.   

## 2014-04-16 NOTE — Telephone Encounter (Signed)
Appt made for 3:45 pm today. Pt aware.

## 2014-04-18 ENCOUNTER — Ambulatory Visit (HOSPITAL_COMMUNITY): Payer: Medicare Other | Attending: Internal Medicine | Admitting: Radiology

## 2014-04-18 VITALS — BP 108/87 | Ht 70.0 in | Wt 150.0 lb

## 2014-04-18 DIAGNOSIS — R0602 Shortness of breath: Secondary | ICD-10-CM

## 2014-04-18 DIAGNOSIS — R0609 Other forms of dyspnea: Secondary | ICD-10-CM | POA: Diagnosis not present

## 2014-04-18 DIAGNOSIS — R0989 Other specified symptoms and signs involving the circulatory and respiratory systems: Secondary | ICD-10-CM | POA: Insufficient documentation

## 2014-04-18 DIAGNOSIS — R079 Chest pain, unspecified: Secondary | ICD-10-CM | POA: Diagnosis not present

## 2014-04-18 DIAGNOSIS — R0789 Other chest pain: Secondary | ICD-10-CM

## 2014-04-18 DIAGNOSIS — R5383 Other fatigue: Secondary | ICD-10-CM

## 2014-04-18 DIAGNOSIS — I2510 Atherosclerotic heart disease of native coronary artery without angina pectoris: Secondary | ICD-10-CM

## 2014-04-18 MED ORDER — TECHNETIUM TC 99M SESTAMIBI GENERIC - CARDIOLITE
11.0000 | Freq: Once | INTRAVENOUS | Status: AC | PRN
  Administered 2014-04-18: 11 via INTRAVENOUS

## 2014-04-18 MED ORDER — REGADENOSON 0.4 MG/5ML IV SOLN
0.4000 mg | Freq: Once | INTRAVENOUS | Status: AC
  Administered 2014-04-18: 0.4 mg via INTRAVENOUS

## 2014-04-18 MED ORDER — TECHNETIUM TC 99M SESTAMIBI GENERIC - CARDIOLITE
33.0000 | Freq: Once | INTRAVENOUS | Status: AC | PRN
  Administered 2014-04-18: 33 via INTRAVENOUS

## 2014-04-18 NOTE — Progress Notes (Signed)
[facility] [address] [place], [place] [address] [phone]    Cardiology Nuclear Med Study  Darin Simmons is a 73 y.o. male     MRN : [id number]     DOB: 26-Oct-1941  Procedure Date: 04/18/2014  Nuclear Med Background Indication for Stress Test:  Evaluation for Ischemia, Stent Patency and Abnormal EKG History:  Hx CAD, Stent, 2013 Echo EF 60-65%, moderate to severe AS Cardiac Risk Factors: Family History - CAD, History of Smoking, Lipids and TIA  Symptoms:  Chest Pain and DOE   Nuclear Pre-Procedure Caffeine/Decaff Intake:  None> 12 hrs NPO After: 7:00pm   Lungs:  clear O2 Sat: 98% on room air. IV 0.9% NS with Angio Cath:  22g  IV Site: R Antecubital x 1, tolerated well IV Started by:  Irven Baltimore, RN  Chest Size (in):  44 Cup Size: n/a  Height: 5\' 10"  (1.778 m)  Weight:  150 lb (68.04 kg)  BMI:  Body mass index is 21.52 kg/(m^2). Tech Comments:  No medications today    Nuclear Med Study 1 or 2 day study: 1 day  Stress Test Type:  Lexiscan  Reading MD: N/A  Order Authorizing Provider:  Larae Grooms, MD  Resting Radionuclide: Technetium 66m Sestamibi  Resting Radionuclide Dose: 11.0 mCi   Stress Radionuclide:  Technetium 42m Sestamibi  Stress Radionuclide Dose: 33.0 mCi           Stress Protocol Rest HR: 48 Stress HR: 75  Rest BP: 108/87 Stress BP: 130/71  Exercise Time (min): n/a METS: n/a   Predicted Max HR: 150 bpm % Max HR: 50 bpm Rate Pressure Product: 9750   Dose of Adenosine (mg):  n/a Dose of Lexiscan: 0.4 mg  Dose of Atropine (mg): n/a Dose of Dobutamine: n/a mcg/kg/min (at max HR)  Stress Test Technologist: Crissie Figures, RN  Nuclear Technologist:  Charlton Amor, CNMT     Rest Procedure:  Myocardial perfusion imaging was performed at rest 45 minutes following the intravenous administration of Technetium 87m Sestamibi. Rest ECG: sinus bradycardia with marked LVH with ST/T wave changes consistent with  repolarization vs. ischemia  Stress Procedure:  The patient received IV Lexiscan 0.4 mg over 15-seconds.  Technetium 31m Sestamibi injected at 30-seconds.  Quantitative spect images were obtained after a 45 minute delay. Stress ECG: No significant change from baseline ECG  QPS Raw Data Images:  There is interference from nuclear activity from structures below the diaphragm. This does not affect the ability to read the study. Stress Images:  Normal homogeneous uptake in all areas of the myocardium. Rest Images:  Normal homogeneous uptake in all areas of the myocardium. Subtraction (SDS):  No evidence of ischemia. Transient Ischemic Dilatation (Normal <1.22):  1.13 Lung/Heart Ratio (Normal <0.45):  0.22  Quantitative Gated Spect Images QGS EDV:  118 ml QGS ESV:  62 ml  Impression Exercise Capacity:  Lexiscan with no exercise. BP Response:  Normal blood pressure response. Clinical Symptoms:  lightheaded and nauseated ECG Impression:  EKG uninterpretable due to marked LVH with repolarization changes Comparison with Prior Nuclear Study: No images to compare  Overall Impression:  Low risk stress nuclear study no ischemia with mildly reduced to low normal LVF.  LV Ejection Fraction: 48%.  LV Wall Motion:  Mildly reduced LV Function; Mild global hypokinesis  Signed: Fransico Him, MD [facility]

## 2014-04-22 ENCOUNTER — Telehealth: Payer: Self-pay | Admitting: Interventional Cardiology

## 2014-04-22 NOTE — Telephone Encounter (Signed)
Called pt with results.

## 2014-04-22 NOTE — Telephone Encounter (Signed)
New message ° ° ° ° °Want stress test results °

## 2014-04-22 NOTE — Telephone Encounter (Addendum)
Pt is still adamant about going off plavix and going on the systemic enzymes. Pt thinks the enzymes will be helpful with his condition. Pt thinks he may go off plavix for 10 days and take the systemic enzymes and then alternate every other week with plavix and the enzymes. He will take aspirin as well. Pt states this is what he wants to do.

## 2014-04-23 NOTE — Telephone Encounter (Signed)
I would say stay on Plavix and take half dose of enzymes to start until we see if there are any bleeding problems.  If not, could look at increasing dose of enzymes.

## 2014-04-24 NOTE — Telephone Encounter (Signed)
Pt will call back with an update in a few weeks unless he has problems before.

## 2014-04-24 NOTE — Telephone Encounter (Signed)
Pt notified.and agreeable

## 2014-06-28 DIAGNOSIS — H40039 Anatomical narrow angle, unspecified eye: Secondary | ICD-10-CM | POA: Diagnosis not present

## 2014-06-28 DIAGNOSIS — H2510 Age-related nuclear cataract, unspecified eye: Secondary | ICD-10-CM | POA: Diagnosis not present

## 2014-08-21 DIAGNOSIS — Z23 Encounter for immunization: Secondary | ICD-10-CM | POA: Diagnosis not present

## 2014-09-04 ENCOUNTER — Other Ambulatory Visit: Payer: Self-pay | Admitting: *Deleted

## 2014-09-04 MED ORDER — ATORVASTATIN CALCIUM 80 MG PO TABS
80.0000 mg | ORAL_TABLET | Freq: Every evening | ORAL | Status: DC
Start: 2014-09-04 — End: 2015-05-20

## 2015-02-10 DIAGNOSIS — Z5181 Encounter for therapeutic drug level monitoring: Secondary | ICD-10-CM | POA: Diagnosis not present

## 2015-02-10 DIAGNOSIS — N400 Enlarged prostate without lower urinary tract symptoms: Secondary | ICD-10-CM | POA: Diagnosis not present

## 2015-02-10 DIAGNOSIS — Z0000 Encounter for general adult medical examination without abnormal findings: Secondary | ICD-10-CM | POA: Diagnosis not present

## 2015-02-10 DIAGNOSIS — I350 Nonrheumatic aortic (valve) stenosis: Secondary | ICD-10-CM | POA: Diagnosis not present

## 2015-02-10 DIAGNOSIS — I2510 Atherosclerotic heart disease of native coronary artery without angina pectoris: Secondary | ICD-10-CM | POA: Diagnosis not present

## 2015-02-10 DIAGNOSIS — Z125 Encounter for screening for malignant neoplasm of prostate: Secondary | ICD-10-CM | POA: Diagnosis not present

## 2015-02-10 DIAGNOSIS — I739 Peripheral vascular disease, unspecified: Secondary | ICD-10-CM | POA: Diagnosis not present

## 2015-02-10 DIAGNOSIS — E780 Pure hypercholesterolemia: Secondary | ICD-10-CM | POA: Diagnosis not present

## 2015-02-12 ENCOUNTER — Encounter: Payer: Self-pay | Admitting: Interventional Cardiology

## 2015-02-13 ENCOUNTER — Encounter: Payer: Self-pay | Admitting: Interventional Cardiology

## 2015-02-13 ENCOUNTER — Ambulatory Visit (INDEPENDENT_AMBULATORY_CARE_PROVIDER_SITE_OTHER): Payer: Medicare Other | Admitting: Interventional Cardiology

## 2015-02-13 VITALS — BP 122/78 | HR 59 | Ht 70.0 in | Wt 157.8 lb

## 2015-02-13 DIAGNOSIS — I700 Atherosclerosis of aorta: Secondary | ICD-10-CM | POA: Insufficient documentation

## 2015-02-13 DIAGNOSIS — E782 Mixed hyperlipidemia: Secondary | ICD-10-CM

## 2015-02-13 DIAGNOSIS — I2510 Atherosclerotic heart disease of native coronary artery without angina pectoris: Secondary | ICD-10-CM

## 2015-02-13 DIAGNOSIS — I350 Nonrheumatic aortic (valve) stenosis: Secondary | ICD-10-CM | POA: Diagnosis not present

## 2015-02-13 NOTE — Addendum Note (Signed)
Addended by: Julian Hy T on: 02/13/2015 04:16 PM   Modules accepted: Orders, Medications

## 2015-02-13 NOTE — Progress Notes (Signed)
Patient ID: Darin Simmons, male   DOB: 13-Mar-1941, 74 y.o.   MRN: [id number]    [facility] [address], [place], [place]  [address] Phone: [phone] Fax:  [phone]  Date:  02/13/2015   ID:  Hines, Kloss 08/12/1941, MRN [id number]  PCP:  Marjorie Smolder, MD      History of Present Illness: Darin Simmons is a 74 y.o. 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 He took some systemic enzymes and stopped his Plavix in 5/15, on his own- without consulting [place].  He reduced his ASA to 81 mg BID.  E feels more healthy.  He feels that the blisters he had in the past are better after taking the systemic enzymes  Blisters were thought to be from embolic phenomenon from aortic atherosclerosis.   He no longer walks on the treadmill. He did tried to walk more in the neighborhood, but noticed that he was very tired the next day.  No problems with walking.  No CP or SHOB with walking.  Exercise is limited by sciatica- but leg sx are much improved after stretching exercises.  He was under a lot of stress due to his ex-wife's illlness- she passed away in 15-Jul-2014.    He is overall pleased with his exercise capacity.  WHen he is in [place], [place] 6[place]7 mi[place]       Wt Readings from Last 3 Encounters:  04/18/14 150 lb (68.04 kg)  04/16/14 152 lb (68.947 kg)  01/01/14 182 lb (82.555 kg)     Past Medical History  Diagnosis Date  . Hypercholesterolemia   . Aortic stenosis   . 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 BPH (benign prostatic hyperplasia)   . PVD (peripheral vascular disease)   . Heart murmur   . Heart attack 2004  . CAD (coronary artery disease) 2004  . 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 atorvastatin (LIPITOR) 80 MG tablet Take 1 tablet (80 mg total) by mouth every evening. 30 tablet 5  . B Complex Vitamins (VITAMIN B COMPLEX PO) Take 1 tablet by  mouth daily.     Marland Kitchen BREWERS YEAST PO Take 1,000 mg by mouth as needed (stress).    . cholecalciferol (VITAMIN D) 400 UNITS TABS Take 400 Units by mouth daily.    Marland Kitchen ezetimibe (ZETIA) 10 MG tablet Take 1 tablet (10 mg total) by mouth daily. 90 tablet 3  . Flaxseed, Linseed, (FLAX SEED OIL PO) Take by mouth.    Marland Kitchen GLUCOSAMINE SULFATE PO Take 2,000 mg by mouth daily.    . IODINE, KELP, PO Take by mouth.    . Magnesium 400 MG CAPS Take 400 mg by mouth daily.     . nitroGLYCERIN (NITROSTAT) 0.4 MG SL tablet Place 0.4 mg under the tongue every 5 (five) minutes as needed for chest pain.     . potassium gluconate 595 MG TABS Take 595 mg by mouth every evening.    . vitamin C (ASCORBIC ACID) 500 MG tablet Take 500 mg by mouth 2 (two) times daily.     Marland Kitchen zinc gluconate 50 MG tablet Take 50 mg by mouth daily.     No current facility-administered medications for this visit.    Allergies:    Allergies  Allergen Reactions  . Diphenhydramine Hcl Other (  See Comments)    Shaky legs    Social History:  The patient  reports that he quit smoking about 6 years ago. His smoking use included Cigarettes. He has a 50 pack-year smoking history. He has never used smokeless tobacco. He reports that he drinks alcohol. He reports that he does not use illicit drugs.   Family History:  The patient's family history includes Heart disease in his father.   ROS:  Please see the history of present illness.  No nausea, vomiting.  No fevers, chills.  No focal weakness.  No dysuria.    All other systems reviewed and negative.   PHYSICAL EXAM: VS:  There were no vitals taken for this visit. Well nourished, well developed, in no acute distress HEENT: normal Neck: no JVD, no carotid bruits, normal carotid upstrokes Cardiac:  normal S1, S2; RRR; 3/6 systolic Lungs:  clear to auscultation bilaterally, no wheezing, rhonchi or rales Abd: soft, nontender, no hepatomegaly Ext: no edema Skin: warm and dry Neuro:   no focal  abnormalities noted Psych: normal afect  EKG:  NSR, LVH, TWI laterally; mild inferior ST depression   ; no change since 2015 ECG  ASSESSMENT AND PLAN:  1. Aortic stenosis  Reviewed the echo results from 2013. Moderate to severe by prior echocardiogram in 3/13. The leaflets did appear to open fairly well at that time. On exam, he has a well-preserved S2 adn carotid upstroke. No symptoms of severe aortic stenosis. Continue to monitor. Tolerating exercise for the most part.  He will watch for fatigue, CP, syncope.  If sx change, he will let us know.  Would consider w/u at that time, first with repeat echo and then possibly with cath. 2. Hypercholesteremia, pure  Continue Vitamin D Tablet, 1000 UNIT, 1 tablet, Orally, Once a day Continue Atorvastatin Calcium Tablet, 80MG , TAKE ONE TABLET BY MOUTH EVERY DAY Lipids well-controlled.  LDL 68 in 2/14.  Blood work checked by Dr. Darcus Austin.  LDL in 2016: 45.  HDL 36. TG 91.  3. Coronary artery disease  Prior drug-eluting stents in the right coronary artery. These were placed over a year ago. He can safely come off of Plavix for 5 days prior to any surgery. I would recommend keeping him on long-term Plavix due to the fact that he had multiple overlapping stents and a long area of disease was treated. No angina.  He feels better now off of the Plavix.  He will continue with the systemic enzymes as his anticoagulant.  In the past (2015) we discussed the enzymes as noted below.  He chose the enzymes over the plavix.:  In regards to his Rejuvenzyme suuplement-I explained him that this would potentially increase his risk of bleeding. There were no studies to see whether this is safe to use with Plavix. I would not take him off the Plavix due to the multiple drug-eluting stents which are overlapping. He understands the risk and is still wanting to try this new medicine. He states he is not going to take full dose supplement but only a partial dose. Therefore, he  will try it. I asked him to watch for any bleeding side effects.  He also spoke at length about enzymes being used to cure cancer. He feels that [place] states medicine is dominated by [employer]. Herbal remedies or not respected as possible cures.    Chest pressure: Resolved from the last visit.  Aortic atherosclerosis: Check abdominal U/S.   Signed, Mina Marble, MD, [facility]  02/13/2015 8:42 AM

## 2015-02-13 NOTE — Patient Instructions (Signed)
Your physician recommends that you continue on your current medications as directed. Please refer to the Current Medication list given to you today.  Your physician has requested that you have an abdominal aorta duplex. During this test, an ultrasound is used to evaluate the aorta. Allow 30 minutes for this exam. Do not eat after midnight the day before and avoid carbonated beverages  Your physician wants you to follow-up in: 1 year follow up with Dr. Irish Lack. You will receive a reminder letter in the mail two months in advance. If you don't receive a letter, please call our office to schedule the follow-up appointment.

## 2015-02-28 ENCOUNTER — Encounter (HOSPITAL_COMMUNITY): Payer: PRIVATE HEALTH INSURANCE

## 2015-02-28 ENCOUNTER — Ambulatory Visit (HOSPITAL_COMMUNITY): Payer: Medicare Other | Attending: Interventional Cardiology | Admitting: Cardiology

## 2015-02-28 DIAGNOSIS — I700 Atherosclerosis of aorta: Secondary | ICD-10-CM | POA: Diagnosis not present

## 2015-02-28 NOTE — Progress Notes (Signed)
Aorto-iliac duplex performed 

## 2015-04-23 ENCOUNTER — Other Ambulatory Visit: Payer: Self-pay

## 2015-04-23 MED ORDER — EZETIMIBE 10 MG PO TABS: 10.0000 mg | ORAL_TABLET | Freq: Every day | ORAL | Status: DC

## 2015-05-20 ENCOUNTER — Other Ambulatory Visit: Payer: Self-pay | Admitting: *Deleted

## 2015-05-20 MED ORDER — ATORVASTATIN CALCIUM 80 MG PO TABS: 80.0000 mg | ORAL_TABLET | Freq: Every evening | ORAL | Status: DC

## 2015-06-03 ENCOUNTER — Telehealth: Payer: Self-pay | Admitting: *Deleted

## 2015-06-03 NOTE — Telephone Encounter (Signed)
Patient would like to reduce the dose of atorvastatin to 40mg . He stated that he is having some muscle discomfort, especially in his arms, and he feels that this is coming from the high dose of atorvastatin. Please advise. Thanks, MI

## 2015-06-03 NOTE — Telephone Encounter (Signed)
OK to reduce. Recheck lipids and liver in 3 months after change.

## 2015-06-04 ENCOUNTER — Telehealth: Payer: Self-pay

## 2015-06-04 DIAGNOSIS — E785 Hyperlipidemia, unspecified: Secondary | ICD-10-CM

## 2015-06-04 DIAGNOSIS — Z79899 Other long term (current) drug therapy: Secondary | ICD-10-CM

## 2015-06-04 MED ORDER — ATORVASTATIN CALCIUM 40 MG PO TABS: 40.0000 mg | ORAL_TABLET | Freq: Every day | ORAL | Status: DC

## 2015-06-04 NOTE — Telephone Encounter (Signed)
Left message to call back  

## 2015-06-04 NOTE — Telephone Encounter (Signed)
Please put in orders for the lab

## 2015-06-04 NOTE — Addendum Note (Signed)
Addended by: Brett Canales on: 06/04/2015 02:17 PM   Modules accepted: Orders

## 2015-06-04 NOTE — Telephone Encounter (Signed)
Okay 

## 2015-06-04 NOTE — Telephone Encounter (Signed)
Patient calls back today re: Atorvastatin. Note per Dr. Irish Lack stating he may reduce Atorvastatin to 40mg  daily. Will need new Rx, and come in for Lipid profile and Liver functions in 3 months.

## 2015-06-05 NOTE — Telephone Encounter (Signed)
Called pt back and he said that he actually spoke to someone earlier and they had already informed him that Dr. Irish Lack said it was ok for pt to reduce Atorvastatin to 40mg  and recheck the labs in 3 months. Pt scheduled for labs 08/27/15. Pt verbalized understanding.

## 2015-08-25 DIAGNOSIS — Z23 Encounter for immunization: Secondary | ICD-10-CM | POA: Diagnosis not present

## 2015-08-27 ENCOUNTER — Other Ambulatory Visit (INDEPENDENT_AMBULATORY_CARE_PROVIDER_SITE_OTHER): Payer: Medicare Other | Admitting: *Deleted

## 2015-08-27 DIAGNOSIS — E782 Mixed hyperlipidemia: Secondary | ICD-10-CM

## 2015-08-27 LAB — HEPATIC FUNCTION PANEL
ALK PHOS: 90 U/L (ref 40–115)
ALT: 27 U/L (ref 9–46)
AST: 31 U/L (ref 10–35)
Albumin: 4.5 g/dL (ref 3.6–5.1)
BILIRUBIN TOTAL: 1.2 mg/dL (ref 0.2–1.2)
Bilirubin, Direct: 0.3 mg/dL — ABNORMAL HIGH (ref <=0.2)
Indirect Bilirubin: 0.9 mg/dL (ref 0.2–1.2)
Total Protein: 7 g/dL (ref 6.1–8.1)

## 2015-08-27 LAB — LIPID PANEL
Cholesterol: 109 mg/dL — ABNORMAL LOW (ref 125–200)
HDL: 45 mg/dL (ref >=40)
LDL Cholesterol: 51 mg/dL (ref <130)
Total CHOL/HDL Ratio: 2.4 Ratio (ref <=5.0)
Triglycerides: 65 mg/dL (ref <150)
VLDL: 13 mg/dL (ref <30)

## 2015-09-04 ENCOUNTER — Telehealth: Payer: Self-pay | Admitting: *Deleted

## 2015-09-04 MED ORDER — ROSUVASTATIN CALCIUM 20 MG PO TABS: 20.0000 mg | ORAL_TABLET | Freq: Every day | ORAL | Status: DC

## 2015-09-04 NOTE — Telephone Encounter (Signed)
-----   Message from Jettie Booze, MD sent at 09/02/2015  4:07 PM EDT ----- OK to switch atorvastatin to generic Crestor 20 mg daily.  Recheck lipids and liver in 3 months

## 2015-09-04 NOTE — Telephone Encounter (Signed)
Spoke to pt to let him know that Dr. Irish Lack has ok'd for him to switch from Atorvastatin to Crestor 20 mg 1 daily.  Pt was informed that a 90 day rx was sent into his pharmacy and that Dr. Irish Lack wanted him to repeat his LIPIDS and LFT's in 3 months.  Pt advised that he will be seeing his GP, Darcus Austin, and will have labs checked and faxed to [place].

## 2015-09-09 ENCOUNTER — Telehealth: Payer: Self-pay

## 2015-09-09 MED ORDER — ATORVASTATIN CALCIUM 20 MG PO TABS: 20.0000 mg | ORAL_TABLET | Freq: Every day | ORAL | Status: DC

## 2015-09-09 NOTE — Telephone Encounter (Signed)
We received a fax from the pts pharmacy, [facility], stating that the pt can not afford Rosuvastatin 20 mg daily as a months supply costs $105.00. The pt requested at his last OV with Dr Irish Lack on 02/13/15 to stop taking Atorvastain 40 mg daily and to start taking Rosuvastatin 20 mg daily. I s/w the pt and he states that he wants to come off of statins all together as he feels he can control his cholesterol with diet. He states that he is going to stop taking Zetia when he finishes current bottle and will take Atorvastatin 20 mg daily for 1 month and then stop as he does not feel that he should just stop taking Statins all at once, he wants to taper off instead. Dr Irish Lack is aware.  RX for Atorvastatin has been sent to [facility] to fill per his request and Zetia has been removed from his med list.

## 2015-12-25 DIAGNOSIS — T148 Other injury of unspecified body region: Secondary | ICD-10-CM | POA: Diagnosis not present

## 2016-02-18 DIAGNOSIS — I739 Peripheral vascular disease, unspecified: Secondary | ICD-10-CM | POA: Diagnosis not present

## 2016-02-18 DIAGNOSIS — I350 Nonrheumatic aortic (valve) stenosis: Secondary | ICD-10-CM | POA: Diagnosis not present

## 2016-02-18 DIAGNOSIS — Z125 Encounter for screening for malignant neoplasm of prostate: Secondary | ICD-10-CM | POA: Diagnosis not present

## 2016-02-18 DIAGNOSIS — E7800 Pure hypercholesterolemia, unspecified: Secondary | ICD-10-CM | POA: Diagnosis not present

## 2016-02-18 DIAGNOSIS — Z0000 Encounter for general adult medical examination without abnormal findings: Secondary | ICD-10-CM | POA: Diagnosis not present

## 2016-02-18 DIAGNOSIS — I2510 Atherosclerotic heart disease of native coronary artery without angina pectoris: Secondary | ICD-10-CM | POA: Diagnosis not present

## 2016-03-03 DIAGNOSIS — L989 Disorder of the skin and subcutaneous tissue, unspecified: Secondary | ICD-10-CM | POA: Diagnosis not present

## 2016-03-03 DIAGNOSIS — L821 Other seborrheic keratosis: Secondary | ICD-10-CM | POA: Diagnosis not present

## 2016-03-23 DIAGNOSIS — Z1211 Encounter for screening for malignant neoplasm of colon: Secondary | ICD-10-CM | POA: Diagnosis not present

## 2016-03-23 DIAGNOSIS — Z0000 Encounter for general adult medical examination without abnormal findings: Secondary | ICD-10-CM | POA: Diagnosis not present

## 2016-04-02 DIAGNOSIS — L821 Other seborrheic keratosis: Secondary | ICD-10-CM | POA: Diagnosis not present

## 2016-04-02 DIAGNOSIS — L400 Psoriasis vulgaris: Secondary | ICD-10-CM | POA: Diagnosis not present

## 2016-04-09 DIAGNOSIS — E7800 Pure hypercholesterolemia, unspecified: Secondary | ICD-10-CM | POA: Diagnosis not present

## 2016-07-09 DIAGNOSIS — H40033 Anatomical narrow angle, bilateral: Secondary | ICD-10-CM | POA: Diagnosis not present

## 2016-07-09 DIAGNOSIS — H2513 Age-related nuclear cataract, bilateral: Secondary | ICD-10-CM | POA: Diagnosis not present

## 2016-08-05 DIAGNOSIS — Z23 Encounter for immunization: Secondary | ICD-10-CM | POA: Diagnosis not present

## 2017-02-09 DIAGNOSIS — I350 Nonrheumatic aortic (valve) stenosis: Secondary | ICD-10-CM | POA: Diagnosis not present

## 2017-02-09 DIAGNOSIS — K625 Hemorrhage of anus and rectum: Secondary | ICD-10-CM | POA: Diagnosis not present

## 2017-02-10 ENCOUNTER — Telehealth: Payer: Self-pay | Admitting: *Deleted

## 2017-02-10 NOTE — Telephone Encounter (Signed)
Faxed referral from Darcus Austin, MD of [facility] at [facility] sent to scheduling

## 2017-02-16 DIAGNOSIS — Z1211 Encounter for screening for malignant neoplasm of colon: Secondary | ICD-10-CM | POA: Diagnosis not present

## 2017-02-16 DIAGNOSIS — K649 Unspecified hemorrhoids: Secondary | ICD-10-CM | POA: Diagnosis not present

## 2017-02-16 DIAGNOSIS — K625 Hemorrhage of anus and rectum: Secondary | ICD-10-CM | POA: Diagnosis not present

## 2017-02-24 DIAGNOSIS — Z5181 Encounter for therapeutic drug level monitoring: Secondary | ICD-10-CM | POA: Diagnosis not present

## 2017-02-24 DIAGNOSIS — Z125 Encounter for screening for malignant neoplasm of prostate: Secondary | ICD-10-CM | POA: Diagnosis not present

## 2017-02-24 DIAGNOSIS — I2510 Atherosclerotic heart disease of native coronary artery without angina pectoris: Secondary | ICD-10-CM | POA: Diagnosis not present

## 2017-02-24 DIAGNOSIS — Z0000 Encounter for general adult medical examination without abnormal findings: Secondary | ICD-10-CM | POA: Diagnosis not present

## 2017-02-24 DIAGNOSIS — I350 Nonrheumatic aortic (valve) stenosis: Secondary | ICD-10-CM | POA: Diagnosis not present

## 2017-02-24 DIAGNOSIS — E7800 Pure hypercholesterolemia, unspecified: Secondary | ICD-10-CM | POA: Diagnosis not present

## 2017-02-24 DIAGNOSIS — I739 Peripheral vascular disease, unspecified: Secondary | ICD-10-CM | POA: Diagnosis not present

## 2017-03-03 NOTE — Progress Notes (Signed)
Patient ID: Darin Simmons, male   DOB: 07-23-1941, 76 y.o.   MRN: [id number]    [place], [place], [place]  [address] Phone: [phone] Fax:  [phone]  Date:  03/04/2017   ID:  Darin, Simmons [birth year], MRN [id number]  PCP:  Darin Smolder, MD      History of Present Illness: Darin Simmons is a 76 y.o. 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 He took some systemic enzymes and stopped his Plavix in 5/15, on his own- without consulting [place].  He reduced his ASA to 81 mg BID.  He feels more healthy.  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   He has stopped his statin, aspirin and took systemic enzymes.    No CP with walking.   He has some SHOB.  He feels that there is a restriction on his breathing with exertion.  He has added some weights.  He did not walk much I the winter.  He did some stretching and weights.    He is overall pleased with his exercise capacity.  He reports that his shortness of breath has increased a little after he walks. It still does not limit him much but there is a slight change.    He has been fasting intermittently, 12 -14 hours per day.  Fasts 27 hours once a week.  He eats raw foods.  He does not eat meat.  Diet has improved significantly.   He had some blood in stool ad a hemorrhoid was detected.  Prior colonoscopy was negative.  THis has resolved.  He is heading to [place] to visit his mother who is turning 37 years old.       Wt Readings from Last 3 Encounters:  03/04/17 143 lb 1.9 oz (64.9 kg)  02/13/15 157 lb 12.8 oz (71.6 kg)  04/18/14 150 lb (68 kg)     Past Medical History:  Diagnosis Date  . 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 Aortic stenosis   . BPH (benign prostatic hyperplasia)   . CAD (coronary artery disease) 2004  .  Heart attack 2004  . Heart murmur   . Hypercholesterolemia   . 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 PVD (peripheral vascular disease) ([place])     Current Outpatient Prescriptions  Medication Sig Dispense Refill  . BREWERS YEAST PO Take 1,000 mg by mouth as needed (stress).    . cholecalciferol (VITAMIN D) 400 UNITS TABS Take 400 Units by mouth daily.    . folic acid (FOLVITE) 329 MCG tablet Take 800 mcg by mouth daily.    Marland Kitchen GLUCOSAMINE SULFATE PO Take 2,000 mg by mouth daily.    . IODINE, KELP, PO Take by mouth.    . Magnesium 400 MG CAPS Take 400 mg by mouth daily.     . Melatonin 5 MG TABS Take 5 mg by mouth daily.    . nitroGLYCERIN (NITROSTAT) 0.4 MG SL tablet Place 0.4 mg under the tongue every 5 (five) minutes as needed for chest pain.     . potassium gluconate 595 MG TABS Take 595 mg by mouth every evening.    . Selenium 200 MCG CAPS Take 200 mcg by mouth.    . vitamin C (ASCORBIC ACID) 500 MG tablet Take  500 mg by mouth 2 (two) times daily.     . vitamin E 100 UNIT capsule Take 1,000 Units by mouth once a week.    . zinc gluconate 50 MG tablet Take 50 mg by mouth daily.     No current facility-administered medications for this visit.     Allergies:    Allergies  Allergen Reactions  . Diphenhydramine Hcl Other (See Comments)    Shaky legs  . Bee Venom   . Crestor [Rosuvastatin]     MUSCLE PAIN  . Statins     MUSCLE PAIN    Social History:  The patient  reports that he quit smoking about 8 years ago. His smoking use included Cigarettes. He has a 50.00 pack-year smoking history. He has never used smokeless tobacco. He reports that he drinks alcohol. He reports that he does not use drugs.   Family History:  The patient's family history includes Heart disease in his father.   ROS:  Please see the history of present illness.  No nausea, vomiting.  No fevers, chills.  No focal weakness.  No dysuria.    All other systems reviewed and negative.   PHYSICAL EXAM: VS:  BP 126/80   Pulse (!) 58    Resp 16   Ht 5\' 10"  (1.778 m)   Wt 143 lb 1.9 oz (64.9 kg)   BMI 20.54 kg/m  Well nourished, well developed, in no acute distress  HEENT: normal  Neck: no JVD , no carotid bruits, normal carotid upstrokes Cardiac:  normal S1, S2; RRR; 3/6 systolic  Lungs:  clear to auscultation bilaterally, no wheezing, rhonchi or rales  Abd: soft, nontender, no hepatomegaly  Ext: no edema  Skin: warm and dry  Neuro:   no focal abnormalities noted Psych: normal afect  EKG:  Sinus bradycardia, LVH, TWI laterally; mild inferior ST depression , PACs  ; no change since 2015 ECG  ASSESSMENT AND PLAN:  1. Aortic stenosis  Reviewed the echo results from 2013. Moderate to severe by prior echocardiogram in 3/13. The leaflets did appear to open fairly well at that time. On exam, he has a well-preserved S2 and carotid upstroke. No symptoms of severe aortic stenosis.  Tolerating exercise for the most part.  He will watch for fatigue, CP, syncope.  He feels some "imbalance" and mild lightheadedness at times.  Check echocardiogram.  Would consider w/u at that time, first with repeat echo and then possibly with cath.  Some of the symptoms could represent a progression in his aortic stenosis. Echocardiogram will be helpful. There has not been much change in his exam.  2. Hypercholesteremia, pure  Continue Vitamin D Tablet, 1000 UNIT, 1 tablet, Orally, Once a day Now off atorvastatin. Took low dose  Has had a drastic change in his diet. Lipids well-controlled per his report with Dr. Darcus Austin.  LDL 68 in 2/14.  Blood work checked by Dr. Darcus Austin.  LDL in 2016: 45.  HDL 36. TG 91. I commended his dietary changes. 3. Coronary artery disease  Prior drug-eluting stents in the right coronary artery.  Angina controlled with medical therapy. These were placed several years ago.  I would recommend keeping him on long-term Plavix due to the fact that he had multiple overlapping stents and a long area of disease was treated.   He feels better now off of the Plavix.  He will continue with the systemic enzymes as his anticoagulant.  In the past (2015) we discussed the enzymes  as noted below.  He chose the enzymes over the plavix.  He remains off of the plavix.:  In regards to his Rejuvenzyme supplement-I explained him that this would potentially increase his risk of bleeding. There were no studies to see whether this is safe to use with Plavix. I would not take him off the Plavix due to the multiple drug-eluting stents which are overlapping. He understands the risk and is still wanting to try this new medicine. He states he is not going to take full dose supplement but only a partial dose. Therefore, he will try it. I asked him to watch for any bleeding side effects.  He also spoke at length about enzymes being used to cure cancer. He feels that [place] states medicine is dominated by [employer]. Herbal remedies or not respected as possible cures.    Chest pressure: none after med adjustments  Aortic atherosclerosis: No AAA by u/s in 2016.  Trying to manage aortic atherosclerosis by enzymes.   Signed, Mina Marble, MD, [facility] 03/04/2017 11:51 AM

## 2017-03-04 ENCOUNTER — Ambulatory Visit (INDEPENDENT_AMBULATORY_CARE_PROVIDER_SITE_OTHER): Payer: Medicare Other | Admitting: Interventional Cardiology

## 2017-03-04 ENCOUNTER — Encounter: Payer: Self-pay | Admitting: Interventional Cardiology

## 2017-03-04 VITALS — BP 126/80 | HR 58 | Resp 16 | Ht 70.0 in | Wt 143.1 lb

## 2017-03-04 DIAGNOSIS — I25119 Atherosclerotic heart disease of native coronary artery with unspecified angina pectoris: Secondary | ICD-10-CM | POA: Diagnosis not present

## 2017-03-04 DIAGNOSIS — E782 Mixed hyperlipidemia: Secondary | ICD-10-CM

## 2017-03-04 DIAGNOSIS — I209 Angina pectoris, unspecified: Secondary | ICD-10-CM

## 2017-03-04 DIAGNOSIS — I700 Atherosclerosis of aorta: Secondary | ICD-10-CM

## 2017-03-04 DIAGNOSIS — I350 Nonrheumatic aortic (valve) stenosis: Secondary | ICD-10-CM

## 2017-03-04 NOTE — Patient Instructions (Signed)
Medication Instructions:  Your physician recommends that you continue on your current medications as directed. Please refer to the Current Medication list given to you today.   Labwork: None ordered  Testing/Procedures: Your physician has requested that you have an echocardiogram. Echocardiography is a painless test that uses sound waves to create images of your heart. It provides your doctor with information about the size and shape of your heart and how well your heart's chambers and valves are working. This procedure takes approximately one hour. There are no restrictions for this procedure.  Follow-Up: Your physician wants you to follow-up in: 1 year with Dr. Varanasi. You will receive a reminder letter in the mail two months in advance. If you don't receive a letter, please call our office to schedule the follow-up appointment.   Any Other Special Instructions Will Be Listed Below (If Applicable).  Echocardiogram An echocardiogram, or echocardiography, uses sound waves (ultrasound) to produce an image of your heart. The echocardiogram is simple, painless, obtained within a short period of time, and offers valuable information to your health care provider. The images from an echocardiogram can provide information such as:  Evidence of coronary artery disease (CAD).  Heart size.  Heart muscle function.  Heart valve function.  Aneurysm detection.  Evidence of a past heart attack.  Fluid buildup around the heart.  Heart muscle thickening.  Assess heart valve function.  Tell a health care provider about:  Any allergies you have.  All medicines you are taking, including vitamins, herbs, eye drops, creams, and over-the-counter medicines.  Any problems you or family members have had with anesthetic medicines.  Any blood disorders you have.  Any surgeries you have had.  Any medical conditions you have.  Whether you are pregnant or may be pregnant. What happens before  the procedure? No special preparation is needed. Eat and drink normally. What happens during the procedure?  In order to produce an image of your heart, gel will be applied to your chest and a wand-like tool (transducer) will be moved over your chest. The gel will help transmit the sound waves from the transducer. The sound waves will harmlessly bounce off your heart to allow the heart images to be captured in real-time motion. These images will then be recorded.  You may need an IV to receive a medicine that improves the quality of the pictures. What happens after the procedure? You may return to your normal schedule including diet, activities, and medicines, unless your health care provider tells you otherwise. This information is not intended to replace advice given to you by your health care provider. Make sure you discuss any questions you have with your health care provider. Document Released: 11/05/2000 Document Revised: 06/26/2016 Document Reviewed: 07/16/2013 Elsevier Interactive Patient Education  2017 Else[employer].    If you need a refill on your cardiac medications before your next appointment, please call your pharmacy.   

## 2017-03-15 ENCOUNTER — Telehealth: Payer: Self-pay | Admitting: Interventional Cardiology

## 2017-03-15 NOTE — Telephone Encounter (Signed)
Patient calling and states that last Saturday morning he experienced some dizziness, blurred vision, and loss of balance that lasted for 20 minutes. Patient states that he sat down and it eventually resolved. He states that he thought he might be having a stroke. He states that he called the [facility] nurse help line and they advised him to call 911. He states that he did not call 911 and did not want to go to the hospital. He denies having any deficits, changes in his speech, LOC, or any other symptoms. Patient denies any symptoms at this time. Patient states that he mentioned having these episodes to Dr. Irish Lack at his [place] on 03/04/17. Dr. Irish Lack ordered an echocardiogram that is scheduled this Friday 03/18/17. Patient states that he has been taking ASA 81 mg in the morning and in the evening. Patient is not taking plavix and is taking enzymes instead.   Patient is asking if his symptoms are due to a stroke or his aortic valve and if he needs his valve replaced. Patient also wanting to know if it safe for him to drive.  Patient advised that after he gets his echocardiogram on Friday that we would have more information and that this message will forwarded to Dr. Irish Lack for review and recommendations. Patient verbalized understanding and appreciated the call.

## 2017-03-15 NOTE — Telephone Encounter (Signed)
New message    Pt is calling requesting call from RN. He states it's rather complicated message.

## 2017-03-16 NOTE — Telephone Encounter (Signed)
Follow Up:   Pt says he is was waiting to hear what Dr Irish Lack said.

## 2017-03-16 NOTE — Telephone Encounter (Signed)
Patient called and made aware that the information has been sent to Dr. Irish Lack. Patient denies having any episodes since last Saturday. Patient also made aware that we will have more information about his valve after his echocardiogram on Friday. Patient advised to err on the side of caution as far as his driving goes. Patient verbalized understanding and appreciated the call.

## 2017-03-18 ENCOUNTER — Ambulatory Visit (HOSPITAL_COMMUNITY): Payer: Medicare Other | Attending: Cardiology

## 2017-03-18 ENCOUNTER — Other Ambulatory Visit: Payer: Self-pay

## 2017-03-18 DIAGNOSIS — E785 Hyperlipidemia, unspecified: Secondary | ICD-10-CM | POA: Insufficient documentation

## 2017-03-18 DIAGNOSIS — I352 Nonrheumatic aortic (valve) stenosis with insufficiency: Secondary | ICD-10-CM | POA: Insufficient documentation

## 2017-03-18 DIAGNOSIS — I350 Nonrheumatic aortic (valve) stenosis: Secondary | ICD-10-CM

## 2017-03-18 DIAGNOSIS — I219 Acute myocardial infarction, unspecified: Secondary | ICD-10-CM | POA: Insufficient documentation

## 2017-03-18 DIAGNOSIS — I2510 Atherosclerotic heart disease of native coronary artery without angina pectoris: Secondary | ICD-10-CM | POA: Insufficient documentation

## 2017-03-18 NOTE — Telephone Encounter (Signed)
Agree.  Will see what echo shows.  Driving would be limited if he has true syncope.

## 2017-03-21 ENCOUNTER — Other Ambulatory Visit: Payer: Self-pay

## 2017-03-21 DIAGNOSIS — I350 Nonrheumatic aortic (valve) stenosis: Secondary | ICD-10-CM

## 2017-03-21 NOTE — Telephone Encounter (Signed)
New message    Pt is calling back about echo results

## 2017-03-21 NOTE — Telephone Encounter (Signed)
   Spoke with Pt about PCP helping to decide with which surgeon he will have to discuss AVR. Sent echocardiogram and a message to Darcus Austin to help patient decide which surgeon to use either Dr. Prescott Gum or Dr. Cyndia Bent  The referral has been put in per Dr. Hassell Done order. ECHO COMPLETE WO IMAGE ENHANCING AGENT  Order# [id number]  Reading physician: Lelon Perla, MD Ordering physician: Jettie Booze, MD Study date: 03/18/17  Result Notes   Notes recorded by Ester Rink Jonanthony Nahar, LPN on 4/56/2563 at 8:93 PM EDT Left voicemail when I attempted to call patient at 4:30. ------  Notes recorded by Currie Dennin L Annalia Metzger, LPN on 7/34/2876 at 8:11 PM EDT Attempted to call again to see if patient called PCP about helping to decide what surgeon he will be referred to for AVR. ------  Notes recorded by Jamina Macbeth L Estes Lehner, LPN on 5/72/6203 at 5:59 PM EDT Received call back from patient. Went over echocardiogram results with patient and let him know Dr. Irish Lack wants to make a referral to a surgeon to discuss AVR. Patient is relieved to hear that he needs to have surgery to correct his aortic valve which is causing him to have many symptoms. Patient states he wants Darcus Austin his PCP to help in the decision making process of who the surgeon will be. He said he has known her for 30 years and wants her to be part of choosing who will be performing the surgery. I told patient Dr. Irish Lack had Dr. Cyndia Bent or Dr. Prescott Gum in mind to discuss the AVR. Pt stated he has been having a little bit of shortness of breath and dizziness. He stated last Saturday, he had an incident where he was dizzy and had blurred vision for 20 minutes and he had to close his eyes. He said today he had a mild sensation of "confused eyes and stress in his forehead". Attempted to call patient back to let him know he needs to call his PCP. ------  Notes recorded by Nachum Derossett L Catrina Fellenz, LPN on 7/41/6384 at 5:36 PM EDT Left note  for patient to call back. ------  Notes recorded by Jettie Booze, MD on 03/18/2017 at 1:49 PM EDT NSR, severe aortic stenosis. Watch for any DOE or syncope. WOuld send to surgeon for referral for AVR; they may wait on him having surgery at this point, but it would be good to get him in the system and established with a surgeon. Would have him see Dr. Cyndia Bent or Dr. Prescott Gum unless he has another preference in surgoens.

## 2017-03-23 ENCOUNTER — Telehealth: Payer: Self-pay | Admitting: Interventional Cardiology

## 2017-03-23 NOTE — Telephone Encounter (Signed)
Patient calling, states that will have an aortic valve replacement was given 2 surgeons to choose from. Patient would like to make Dr. Irish Lack aware of his decision to go with Dr. Cyndia Bent. Thanks.

## 2017-03-23 NOTE — Telephone Encounter (Signed)
Left message for patient to call back  

## 2017-03-25 NOTE — Telephone Encounter (Signed)
Patient has appointment with Dr. Cyndia Bent on 04/01/17 at 1:30PM.

## 2017-04-01 ENCOUNTER — Other Ambulatory Visit: Payer: Self-pay | Admitting: *Deleted

## 2017-04-01 ENCOUNTER — Encounter: Payer: Self-pay | Admitting: Surgery

## 2017-04-01 ENCOUNTER — Institutional Professional Consult (permissible substitution) (INDEPENDENT_AMBULATORY_CARE_PROVIDER_SITE_OTHER): Payer: Medicare Other | Admitting: Surgery

## 2017-04-01 VITALS — BP 123/75 | HR 60 | Resp 20 | Ht 70.0 in | Wt 143.0 lb

## 2017-04-01 DIAGNOSIS — I350 Nonrheumatic aortic (valve) stenosis: Secondary | ICD-10-CM

## 2017-04-01 DIAGNOSIS — I25119 Atherosclerotic heart disease of native coronary artery with unspecified angina pectoris: Secondary | ICD-10-CM | POA: Diagnosis not present

## 2017-04-03 ENCOUNTER — Encounter: Payer: Self-pay | Admitting: Surgery

## 2017-04-03 NOTE — Progress Notes (Signed)
Cardiothoracic Surgery Consultation  PCP is Darcus Austin, MD Referring Provider is Jettie Booze, MD  Chief Complaint  Patient presents with  . Aortic Stenosis    Surgical eval, ECHO 03/18/17    HPI:   The patient is a 76 year old gentleman with hypercholesterolemia, coronary artery disease s/p DES to the RCA, and aortic stenosis. His most recent echo on 03/18/2017 showed a trileaflet aortic valve with severely calcified leaflets and restricted mobility. There was severe stenosis with a mean gradient of 43 mm Hg and a peak of 80 mm Hg. The LVEF was 55-60%. The patient reports having some exertional shortness of breath and fatigue. He had an episode recently of headache and blurred vision and felt like he was going to pass out. It resolved after several minutes. He has not had that happen again. He has not had any chest pain.   He was on Plavix for a while after his stent but stopped taking it and now takes a systemic enzyme which he says acts as an anticoagulant. He is vegetarian and intermittently fasts for 12-14 hrs per day and sometimes more than 24 hrs at a time.    Past Medical History:  Diagnosis Date  . 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 Aortic stenosis   . BPH (benign prostatic hyperplasia)   . CAD (coronary artery disease) 2004  . Heart attack ([place]) 2004  . Heart murmur   . Hypercholesterolemia   . 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 PVD (peripheral vascular disease) ([place])     Past Surgical History:  Procedure Laterality Date  . DENTAL SURGERY     removed  . hair follicle removed Left   . heart stent    . HERNIA REPAIR Bilateral 9/83/38   BIH and umbilical hernia repair  . INGUINAL HERNIA REPAIR Bilateral 01/18/2013   Procedure: LAPAROSCOPIC BILATERAL INGUINAL HERNIA REPAIR;  Surgeon: Odis Hollingshead, MD;  Location: [facility];  Service: General;  Laterality: Bilateral;  . INSERTION OF MESH Bilateral 01/18/2013   Procedure: INSERTION OF MESH;   Surgeon: Odis Hollingshead, MD;  Location: [facility];  Service: General;  Laterality: Bilateral;  AND UMBILICUS  . UMBILICAL HERNIA REPAIR N/A 01/18/2013   Procedure: HERNIA REPAIR UMBILICAL ADULT;  Surgeon: Odis Hollingshead, MD;  Location: [facility];  Service: General;  Laterality: N/A;    Family History  Problem Relation Age of Onset  . Heart disease Father     Social History Social History  Substance Use Topics  . Smoking status: Former Smoker    Packs/day: 1.00    Years: 50.00    Types: Cigarettes    Quit date: 11/22/2008  . Smokeless tobacco: Never Used  . Alcohol use Yes     Comment: occasional    Current Outpatient Prescriptions  Medication Sig Dispense Refill  . aspirin EC 81 MG tablet Take 81 mg by mouth 2 (two) times daily.    . cholecalciferol (VITAMIN D) 400 UNITS TABS Take 400 Units by mouth daily.    . folic acid (FOLVITE) 250 MCG tablet Take 800 mcg by mouth daily.    Marland Kitchen GLUCOSAMINE SULFATE PO Take 2,000 mg by mouth daily.    . IODINE, KELP, PO Take by mouth.    . Magnesium 400 MG CAPS Take 400 mg by mouth daily.     . Melatonin 5 MG TABS Take 5 mg by mouth daily.    . nitroGLYCERIN (NITROSTAT) 0.4  MG SL tablet Place 0.4 mg under the tongue every 5 (five) minutes as needed for chest pain.     . potassium gluconate 595 MG TABS Take 595 mg by mouth every evening.    . pravastatin (PRAVACHOL) 20 MG tablet Take 20 mg by mouth daily.    . Selenium 200 MCG CAPS Take 200 mcg by mouth.    . vitamin C (ASCORBIC ACID) 500 MG tablet Take 500 mg by mouth 2 (two) times daily.     . vitamin E 100 UNIT capsule Take 1,000 Units by mouth once a week.    . zinc gluconate 50 MG tablet Take 50 mg by mouth daily.     No current facility-administered medications for this visit.     Allergies  Allergen Reactions  . Diphenhydramine Hcl Other (See Comments)    Shaky legs  . Bee Venom   . Crestor [Rosuvastatin]     MUSCLE PAIN  . Statins     MUSCLE PAIN    Review of Systems    Constitutional: Positive for activity change and fatigue. Negative for appetite change, chills, fever and unexpected weight change.  HENT: Negative.        Saw his dentist 03/2017  Eyes: Negative.   Respiratory: Negative.   Cardiovascular: Negative for chest pain, palpitations and leg swelling.       Shortness of breath with moderate exertion  Cramps in calves with walking  Varicose veins.  Gastrointestinal: Negative.   Endocrine: Negative.   Genitourinary: Positive for frequency.       BPH  Musculoskeletal: Negative.   Skin: Negative.   Allergic/Immunologic: Negative.   Neurological: Negative.   Hematological: Negative.   Psychiatric/Behavioral: Negative.     BP 123/75   Pulse 60   Resp 20   Ht 5\' 10"  (1.778 m)   Wt 143 lb (64.9 kg)   SpO2 98% Comment: RA  BMI 20.52 kg/m  Physical Exam  Constitutional: He is oriented to person, place, and time. He appears well-developed and well-nourished. No distress.  HENT:  Head: Normocephalic and atraumatic.  Mouth/Throat: Oropharynx is clear and moist.  Teeth in fair condition  Eyes: Conjunctivae and EOM are normal. Pupils are equal, round, and reactive to light.  Neck: Normal range of motion. Neck supple. No JVD present. No thyromegaly present.  Cardiovascular: Normal rate and regular rhythm.   Murmur heard. III/VI systolic murmur RSB  Pulmonary/Chest: Effort normal and breath sounds normal. No respiratory distress.  Abdominal: Soft. Bowel sounds are normal. He exhibits no distension and no mass. There is no tenderness.  Musculoskeletal: Normal range of motion. He exhibits no edema.  Lymphadenopathy:    He has no cervical adenopathy.  Neurological: He is alert and oriented to person, place, and time. He has normal strength. No sensory deficit.  Skin: Skin is warm and dry.  Psychiatric: He has a normal mood and affect.     Diagnostic Tests:     [facility]                        [address], [place] [address]                            [phone]  ------------------------------------------------------------------- Transthoracic Echocardiography  Patient:  General, Wearing MR #:       [id number] Study Date: 03/18/2017 Gender:     M Age:        52 Height:     177.8 cm Weight:     64.9 kg BSA:        1.78 m^2 Pt. Status: Room:   SONOGRAPHER  [place] [place], Out[facility]  cc:  ------------------------------------------------------------------- LV EF: 55% -   60%  ------------------------------------------------------------------- Indications:      Aortic stenosis - I35.0.  ------------------------------------------------------------------- History:   PMH:   Murmur.  Coronary artery disease.  PMH: Myocardial infarction.  Risk factors:  Dyslipidemia.  ------------------------------------------------------------------- Study Conclusions  - Left ventricle: The cavity size was normal. Wall thickness was   increased in a pattern of moderate LVH. Systolic function was   normal. The estimated ejection fraction was in the range of 55%   to 60%. Wall motion was normal; there were no regional wall   motion abnormalities. Doppler parameters are consistent with   abnormal left ventricular relaxation (grade 1 diastolic   dysfunction). - Aortic valve: Valve mobility was restricted. There was severe   stenosis. There was mild regurgitation. Valve area (VTI): 0.65   cm^2. Valve area (Vmax): 0.63 cm^2. Valve area (Vmean): 0.61   cm^2.  Impressions:  - Normal LV systolic function; milld diastolic dysfuncton; moderate   LVH; calcified aortic valve with severe AS (mean gradient 43   mmHg); mild AI.  ------------------------------------------------------------------- Study data:  Comparison was made to the study of 01/28/2012.   Study status:  Routine.  Procedure:  The patient reported no pain pre or post test. Transthoracic echocardiography. Image quality was adequate.  Study completion:  There were no complications. Transthoracic echocardiography.  M-mode, complete 2D, spectral Doppler, and color Doppler.  Birthdate:  Patient birthdate: 05/05/1941.  Age:  Patient is 76 yr old.  Sex:  Gender: male. BMI: 20.5 kg/m^2.  Blood pressure:     126/80  Patient status: Outpatient.  Study date:  Study date: 03/18/2017. Study time: 09:18 AM.  Location:  [facility]  -------------------------------------------------------------------  ------------------------------------------------------------------- Left ventricle:  The cavity size was normal. Wall thickness was increased in a pattern of moderate LVH. Systolic function was normal. The estimated ejection fraction was in the range of 55% to 60%. Wall motion was normal; there were no regional wall motion abnormalities. Doppler parameters are consistent with abnormal left ventricular relaxation (grade 1 diastolic dysfunction).  ------------------------------------------------------------------- Aortic valve:   Trileaflet; severely calcified leaflets. Valve mobility was restricted.  Doppler:   There was severe stenosis. There was mild regurgitation.    VTI ratio of LVOT to aortic valve: 0.19. Valve area (VTI): 0.65 cm^2. Indexed valve area (VTI): 0.36 cm^2/m^2. Peak velocity ratio of LVOT to aortic valve: 0.18. Valve area (Vmax): 0.63 cm^2. Indexed valve area (Vmax): 0.35 cm^2/m^2. Mean velocity ratio of LVOT to aortic valve: 0.18. Valve area (Vmean): 0.61 cm^2. Indexed valve area (Vmean): 0.34 cm^2/m^2. Mean gradient (S): 43 mm Hg. Peak gradient (S): 80 mm Hg.  ------------------------------------------------------------------- Aorta:  Aortic root: The aortic root was normal in  size.  ------------------------------------------------------------------- Mitral valve:   Structurally normal valve.   Mobility was not restricted.  Doppler:  Transvalvular velocity was within the normal range. There was no evidence for stenosis. There was trivial regurgitation.  Indexed valve area by pressure half-time: 0.92 cm^2/m^2.  ------------------------------------------------------------------- Left atrium:  The atrium was normal in size.  ------------------------------------------------------------------- Right ventricle:  The cavity size was normal. Systolic function was normal.  ------------------------------------------------------------------- Pulmonic valve:    Doppler:  Transvalvular velocity was within the normal range. There was no evidence for stenosis.  ------------------------------------------------------------------- Tricuspid valve:   Structurally normal valve.    Doppler: Transvalvular velocity was within the normal range. There was trivial regurgitation.  ------------------------------------------------------------------- Right atrium:  The atrium was normal in size.  ------------------------------------------------------------------- Pericardium:  There was no pericardial effusion.  ------------------------------------------------------------------- Systemic veins: Inferior vena cava: The vessel was normal in size.  ------------------------------------------------------------------- Measurements   Left ventricle                           Value          Reference  LV ID, ED, PLAX chordal                  45    mm       43 - 52  LV ID, ES, PLAX chordal                  36    mm       23 - 38  LV fx shortening, PLAX chordal   (L)     20    %        >=29  LV PW thickness, ED                      12    mm       ----------  IVS/LV PW ratio, ED                      1.08           <=1.3  Stroke volume, 2D                        76    ml        ----------  Stroke volume/bsa, 2D                    43    ml/m^2   ----------  LV e&', lateral                           6.64  cm/s     ----------  LV E/e&', lateral                         7.89           ----------  LV e&', medial                            4.9   cm/s     ----------  LV E/e&', medial                          10.69          ----------  LV e&', average                           5.77  cm/s     ----------  LV E/e&', average                         9.08           ----------    Ventricular septum                       Value          Reference  IVS thickness, ED                        13    mm       ----------    LVOT                                     Value          Reference  LVOT ID, S                               21    mm       ----------  LVOT area                                3.46  cm^2     ----------  LVOT peak velocity, S                    81.3  cm/s     ----------  LVOT mean velocity, S                    53.6  cm/s     ----------  LVOT VTI, S                              22    cm       ----------    Aortic valve                             Value          Reference  Aortic valve peak velocity, S            446   cm/s     ----------  Aortic valve mean velocity, S            303   cm/s     ----------  Aortic valve VTI, S                      117   cm       ----------  Aortic mean gradient, S                  43    mm Hg    ----------  Aortic peak gradient, S                  80    mm Hg    ----------  VTI ratio, LVOT/AV                       0.19           ----------  Aortic valve area, VTI                   0.65  cm^2     ----------  Aortic valve area/bsa, VTI               0.36  cm^2/m^2 ----------  Velocity ratio, peak, LVOT/AV            0.18           ----------  Aortic valve area, peak velocity         0.63  cm^2     ----------  Aortic valve area/bsa, peak              0.35  cm^2/m^2 ----------  velocity  Velocity ratio, mean, LVOT/AV            0.18            ----------  Aortic valve area, mean velocity         0.61  cm^2     ----------  Aortic valve area/bsa, mean              0.34  cm^2/m^2 ----------  velocity  Aortic regurg peak velocity              276   cm/s     ----------  Aortic regurg pressure half-time         695   ms       ----------  Aortic regurg peak gradient              30    mm Hg    ----------    Aorta                                    Value          Reference  Aortic root ID, ED                       31    mm       ----------    Left atrium                              Value          Reference  LA ID, A-P, ES                           29    mm       ----------  LA ID/bsa, A-P                           1.62  cm/m^2   <=2.2  LA volume, S                             54    ml       ----------  LA volume/bsa, S                         30.3  ml/m^2   ----------  LA volume, ES, 1-p A4C                   37.4  ml       ----------  LA volume/bsa, ES, 1-p A4C               21    ml/m^2   ----------  LA volume, ES, 1-p A2C                   63.3  ml       ----------  LA volume/bsa, ES, 1-p A2C               35.5  ml/m^2   ----------    Mitral valve                             Value          Reference  Mitral E-wave peak velocity              52.4  cm/s     ----------  Mitral A-wave peak velocity              83.9  cm/s     ----------  Mitral deceleration time         (H)     521   ms       150 - 230  Mitral pressure half-time                153   ms       ----------  Mitral E/A ratio, peak                   0.6            ----------  Mitral valve area/bsa, PHT, DP           0.92  cm^2/m^2 ----------    Right atrium                             Value          Reference  RA ID, S-I, ES, A4C              (H)     54    mm       34 - 49  RA area, ES, A4C                         15.6  cm^2     8.3 - 19.5  RA volume, ES, A/L                       37.3  ml       ----------  RA volume/bsa, ES, A/L                   20.9  ml/m^2    ----------    Right ventricle                          Value          Reference  RV s&', lateral, S                        6.96  cm/s     ----------  Legend: (L)  and  (H)  mark values outside specified reference range.  ------------------------------------------------------------------- Prepared and Electronically Authenticated by  Kirk Ruths 2018-04-27T10:37:53   Impression:  This 75 year old gentleman as stage  D severe, symptomatic aortic stenosis with NYHA class II symptoms of exertional fatigue and shortness of breath consistent with chronic diastolic heart failure. In addition he recently had an episode of marked dizziness and blurred vision and near syncope. I have personally reviewed his echo and there is a trileaflet aortic valve with severely calcified leaflets and restricted mobility with severe stenosis and a mean gradient of 43 mm Hg. I agree that AVR is indicated in this patient. He is at low risk for open surgical AVR and is not a TAVR candidate. He will require a cardiac cath and I think he should have a CTA of the chest to assess the aortic size. He had a CT of the chest in 2011 and the ascending aorta was 3.2 cm compared to 2.4 cm for the descending aorta. I would plan to use a bioprosthetic valve given his age. I discussed the operative procedure with the patient  including alternatives, benefits and risks; including but not limited to bleeding, blood transfusion, infection, stroke, myocardial infarction, heart block requiring a permanent pacemaker, organ dysfunction, and death.  Neomia Dear understands and agrees to proceed.    Plan:  1. He will be scheduled for cardiac cath by Dr. Irish Lack.  2. He will be scheduled for CTA of the chest to assess the aorta.  3. Then he will be scheduled for AVR +/- CABG depending on the status of his coronary arteries.  I spent 60 minutes performing this consultation and > 50% of this time was spent face to face counseling and  coordinating the care of this patient's severe aortic stenosis.   Gaye Pollack, MD [facility] [phone]

## 2017-04-04 ENCOUNTER — Other Ambulatory Visit: Payer: Self-pay | Admitting: *Deleted

## 2017-04-04 DIAGNOSIS — I38 Endocarditis, valve unspecified: Secondary | ICD-10-CM

## 2017-04-04 MED ORDER — AMOXICILLIN 500 MG PO CAPS: 2000.0000 mg | ORAL_CAPSULE | Freq: Once | ORAL | 6 refills | Status: AC

## 2017-04-05 ENCOUNTER — Other Ambulatory Visit: Payer: Self-pay

## 2017-04-05 DIAGNOSIS — E782 Mixed hyperlipidemia: Secondary | ICD-10-CM

## 2017-04-05 DIAGNOSIS — I350 Nonrheumatic aortic (valve) stenosis: Secondary | ICD-10-CM

## 2017-04-06 ENCOUNTER — Ambulatory Visit
Admission: RE | Admit: 2017-04-06 | Discharge: 2017-04-06 | Disposition: A | Discharge status: Home / Self Care | Payer: Medicare Other | Source: Ambulatory Visit | Attending: Surgery | Admitting: Surgery

## 2017-04-06 ENCOUNTER — Other Ambulatory Visit: Payer: Medicare Other | Admitting: *Deleted

## 2017-04-06 DIAGNOSIS — E782 Mixed hyperlipidemia: Secondary | ICD-10-CM

## 2017-04-06 DIAGNOSIS — I350 Nonrheumatic aortic (valve) stenosis: Secondary | ICD-10-CM

## 2017-04-06 LAB — BASIC METABOLIC PANEL
BUN / CREAT RATIO: 16 (ref 10–24)
BUN: 12 mg/dL (ref 8–27)
CHLORIDE: 100 mmol/L (ref 96–106)
CO2: 21 mmol/L (ref 18–29)
Calcium: 8.5 mg/dL — ABNORMAL LOW (ref 8.6–10.2)
Creatinine, Ser: 0.73 mg/dL — ABNORMAL LOW (ref 0.76–1.27)
GFR calc non Af Amer: 92 mL/min/{1.73_m2} (ref >59)
GFR, EST AFRICAN AMERICAN: 106 mL/min/{1.73_m2} (ref >59)
Glucose: 86 mg/dL (ref 65–99)
Potassium: 4.2 mmol/L (ref 3.5–5.2)
Sodium: 137 mmol/L (ref 134–144)

## 2017-04-06 LAB — CBC
HEMATOCRIT: 42 % (ref 37.5–51.0)
Hemoglobin: 14.9 g/dL (ref 13.0–17.7)
MCH: 32.2 pg (ref 26.6–33.0)
MCHC: 35.5 g/dL (ref 31.5–35.7)
MCV: 91 fL (ref 79–97)
Platelets: 127 10*3/uL — ABNORMAL LOW (ref 150–379)
RBC: 4.63 x10E6/uL (ref 4.14–5.80)
RDW: 13.1 % (ref 12.3–15.4)
WBC: 4.5 10*3/uL (ref 3.4–10.8)

## 2017-04-06 LAB — LIPID PANEL
Chol/HDL Ratio: 3.8 ratio (ref 0.0–5.0)
Cholesterol, Total: 178 mg/dL (ref 100–199)
HDL: 47 mg/dL (ref >39)
LDL CALC: 108 mg/dL — AB (ref 0–99)
Triglycerides: 115 mg/dL (ref 0–149)
VLDL Cholesterol Cal: 23 mg/dL (ref 5–40)

## 2017-04-06 LAB — PROTIME-INR
INR: 1 (ref 0.8–1.2)
PROTHROMBIN TIME: 11.1 s (ref 9.1–12.0)

## 2017-04-06 MED ORDER — IOPAMIDOL (ISOVUE-370) INJECTION 76%
75.0000 mL | Freq: Once | INTRAVENOUS | Status: AC | PRN
  Administered 2017-04-06: 75 mL via INTRAVENOUS

## 2017-04-07 ENCOUNTER — Telehealth: Payer: Self-pay

## 2017-04-07 NOTE — Telephone Encounter (Signed)
-----   Message from Jettie Booze, MD sent at 04/07/2017  5:22 PM EDT ----- LDL increased from prior.  Electrolytes controlled. Blood count stable.

## 2017-04-07 NOTE — Telephone Encounter (Signed)
Patient made aware of results. Patient verbalizes understanding.  Patient made aware that his labs are acceptable to proceed with his heart cath on Monday. Patient states that he forgot to pick up the instruction letter while he was here yesterday.  Reviewed the instructions with him again. Patient was able to repeat them back to me. Patient states that [name] will be driving him home and will be with him after the procedure.

## 2017-04-11 ENCOUNTER — Ambulatory Visit (HOSPITAL_COMMUNITY)
Admission: RE | Admit: 2017-04-11 | Discharge: 2017-04-11 | Disposition: A | Discharge status: Home / Self Care | Payer: Medicare Other | Source: Ambulatory Visit | Attending: Interventional Cardiology | Admitting: Interventional Cardiology

## 2017-04-11 ENCOUNTER — Encounter (HOSPITAL_COMMUNITY)
Admission: RE | Disposition: A | Discharge status: Home / Self Care | Payer: Self-pay | Source: Ambulatory Visit | Attending: Interventional Cardiology

## 2017-04-11 DIAGNOSIS — Z955 Presence of coronary angioplasty implant and graft: Secondary | ICD-10-CM | POA: Insufficient documentation

## 2017-04-11 DIAGNOSIS — I252 Old myocardial infarction: Secondary | ICD-10-CM | POA: Insufficient documentation

## 2017-04-11 DIAGNOSIS — I739 Peripheral vascular disease, unspecified: Secondary | ICD-10-CM | POA: Insufficient documentation

## 2017-04-11 DIAGNOSIS — Z87891 Personal history of nicotine dependence: Secondary | ICD-10-CM | POA: Diagnosis not present

## 2017-04-11 DIAGNOSIS — E7800 Pure hypercholesterolemia, unspecified: Secondary | ICD-10-CM | POA: Insufficient documentation

## 2017-04-11 DIAGNOSIS — I350 Nonrheumatic aortic (valve) stenosis: Secondary | ICD-10-CM | POA: Diagnosis not present

## 2017-04-11 DIAGNOSIS — N400 Enlarged prostate without lower urinary tract symptoms: Secondary | ICD-10-CM | POA: Diagnosis not present

## 2017-04-11 DIAGNOSIS — Z7982 Long term (current) use of aspirin: Secondary | ICD-10-CM | POA: Insufficient documentation

## 2017-04-11 DIAGNOSIS — I2510 Atherosclerotic heart disease of native coronary artery without angina pectoris: Secondary | ICD-10-CM | POA: Insufficient documentation

## 2017-04-11 LAB — POCT I-STAT 3, VENOUS BLOOD GAS (G3P V)
Acid-Base Excess: 2 mmol/L (ref 0.0–2.0)
BICARBONATE: 28.1 mmol/L — AB (ref 20.0–28.0)
O2 SAT: 76 %
PCO2 VEN: 47.7 mmHg (ref 44.0–60.0)
PO2 VEN: 42 mmHg (ref 32.0–45.0)
TCO2: 30 mmol/L (ref 0–100)
pH, Ven: 7.378 (ref 7.250–7.430)

## 2017-04-11 LAB — POCT I-STAT 3, ART BLOOD GAS (G3+)
Acid-Base Excess: 1 mmol/L (ref 0.0–2.0)
BICARBONATE: 26.5 mmol/L (ref 20.0–28.0)
O2 Saturation: 98 %
PCO2 ART: 43.2 mmHg (ref 32.0–48.0)
PH ART: 7.395 (ref 7.350–7.450)
TCO2: 28 mmol/L (ref 0–100)
pO2, Arterial: 100 mmHg (ref 83.0–108.0)

## 2017-04-11 SURGERY — RIGHT HEART CATH AND CORONARY ANGIOGRAPHY
Anesthesia: LOCAL

## 2017-04-11 MED ORDER — HEPARIN (PORCINE) IN NACL 2-0.9 UNIT/ML-% IJ SOLN
INTRAMUSCULAR | Status: DC | PRN
  Administered 2017-04-11: 14:00:00

## 2017-04-11 MED ORDER — SODIUM CHLORIDE 0.9% FLUSH: 3.0000 mL | Freq: Two times a day (BID) | INTRAVENOUS | Status: DC

## 2017-04-11 MED ORDER — HEPARIN SODIUM (PORCINE) 1000 UNIT/ML IJ SOLN
INTRAMUSCULAR | Status: DC | PRN
  Administered 2017-04-11: 3500 [IU] via INTRAVENOUS

## 2017-04-11 MED ORDER — VERAPAMIL HCL 2.5 MG/ML IV SOLN
INTRAVENOUS | Status: AC
  Filled 2017-04-11: qty 2

## 2017-04-11 MED ORDER — SODIUM CHLORIDE 0.9 % WEIGHT BASED INFUSION
3.0000 mL/kg/h | INTRAVENOUS | Status: AC
  Administered 2017-04-11: 3 mL/kg/h via INTRAVENOUS

## 2017-04-11 MED ORDER — SODIUM CHLORIDE 0.9 % IV SOLN: 250.0000 mL | INTRAVENOUS | Status: DC | PRN

## 2017-04-11 MED ORDER — LIDOCAINE HCL (PF) 1 % IJ SOLN
INTRAMUSCULAR | Status: DC | PRN
  Administered 2017-04-11 (×2): 2 mL

## 2017-04-11 MED ORDER — ASPIRIN 81 MG PO CHEW: 81.0000 mg | CHEWABLE_TABLET | ORAL | Status: DC

## 2017-04-11 MED ORDER — IOPAMIDOL (ISOVUE-370) INJECTION 76%
INTRAVENOUS | Status: DC | PRN
  Administered 2017-04-11: 50 mL

## 2017-04-11 MED ORDER — SODIUM CHLORIDE 0.9% FLUSH: 3.0000 mL | INTRAVENOUS | Status: DC | PRN

## 2017-04-11 MED ORDER — MIDAZOLAM HCL 2 MG/2ML IJ SOLN
INTRAMUSCULAR | Status: AC
  Filled 2017-04-11: qty 2

## 2017-04-11 MED ORDER — VERAPAMIL HCL 2.5 MG/ML IV SOLN
INTRAVENOUS | Status: DC | PRN
  Administered 2017-04-11: 10 mL via INTRA_ARTERIAL

## 2017-04-11 MED ORDER — IOPAMIDOL (ISOVUE-370) INJECTION 76%
INTRAVENOUS | Status: AC
  Filled 2017-04-11: qty 100

## 2017-04-11 MED ORDER — LIDOCAINE HCL (PF) 1 % IJ SOLN
INTRAMUSCULAR | Status: AC
  Filled 2017-04-11: qty 30

## 2017-04-11 MED ORDER — SODIUM CHLORIDE 0.9 % WEIGHT BASED INFUSION: 1.0000 mL/kg/h | INTRAVENOUS | Status: DC

## 2017-04-11 MED ORDER — HEPARIN SODIUM (PORCINE) 1000 UNIT/ML IJ SOLN
INTRAMUSCULAR | Status: AC
  Filled 2017-04-11: qty 1

## 2017-04-11 MED ORDER — FENTANYL CITRATE (PF) 100 MCG/2ML IJ SOLN
INTRAMUSCULAR | Status: DC | PRN
  Administered 2017-04-11: 25 ug via INTRAVENOUS

## 2017-04-11 MED ORDER — HEPARIN (PORCINE) IN NACL 2-0.9 UNIT/ML-% IJ SOLN
INTRAMUSCULAR | Status: AC
  Filled 2017-04-11: qty 1000

## 2017-04-11 MED ORDER — SODIUM CHLORIDE 0.9 % IV SOLN
INTRAVENOUS | Status: AC
  Administered 2017-04-11: 15:00:00 via INTRAVENOUS

## 2017-04-11 MED ORDER — MIDAZOLAM HCL 2 MG/2ML IJ SOLN
INTRAMUSCULAR | Status: DC | PRN
  Administered 2017-04-11: 2 mg via INTRAVENOUS

## 2017-04-11 MED ORDER — FENTANYL CITRATE (PF) 100 MCG/2ML IJ SOLN
INTRAMUSCULAR | Status: AC
  Filled 2017-04-11: qty 2

## 2017-04-11 SURGICAL SUPPLY — 15 items
CATH BALLN WEDGE 5F 110CM (CATHETERS) ×2 IMPLANT
CATH INFINITI 5 FR JL3.5 (CATHETERS) ×2 IMPLANT
CATH INFINITI JR4 5F (CATHETERS) ×2 IMPLANT
DEVICE RAD COMP TR BAND LRG (VASCULAR PRODUCTS) ×2 IMPLANT
GLIDESHEATH SLEND SS 6F .021 (SHEATH) ×2 IMPLANT
GUIDEWIRE .025 260CM (WIRE) ×2 IMPLANT
GUIDEWIRE INQWIRE 1.5J.035X260 (WIRE) ×1 IMPLANT
INQWIRE 1.5J .035X260CM (WIRE) ×2
KIT HEART LEFT (KITS) ×2 IMPLANT
PACK CARDIAC CATHETERIZATION (CUSTOM PROCEDURE TRAY) ×2 IMPLANT
SHEATH GLIDE SLENDER 4/5FR (SHEATH) ×2 IMPLANT
TRANSDUCER W/STOPCOCK (MISCELLANEOUS) ×2 IMPLANT
TUBING CIL FLEX 10 FLL-RA (TUBING) ×2 IMPLANT
WIRE ASAHI PROWATER 180CM (WIRE) ×2 IMPLANT
WIRE EMERALD 3MM-J .025X260CM (WIRE) ×2 IMPLANT

## 2017-04-11 NOTE — Progress Notes (Signed)
Site area: right brachial  Site Prior to Removal:  Level 0  Pressure Applied For 15 MINUTES    Minutes Beginning at 1430  Manual:   Yes.    Patient Status During Pull:  stable  Post Pull brachial Site:  Level 0  Post Pull Instructions Given:  Yes.    Post Pull Pulses Present:  Yes.    Dressing Applied:  Yes.    Comments:  Pt tolerated well

## 2017-04-11 NOTE — Discharge Instructions (Signed)
Radial Site Care °Refer to this sheet in the next few weeks. These instructions provide you with information about caring for yourself after your procedure. Your health care provider may also give you more specific instructions. Your treatment has been planned according to current medical practices, but problems sometimes occur. Call your health care provider if you have any problems or questions after your procedure. °What can I expect after the procedure? °After your procedure, it is typical to have the following: °· Bruising at the radial site that usually fades within 1-2 weeks. °· Blood collecting in the tissue (hematoma) that may be painful to the touch. It should usually decrease in size and tenderness within 1-2 weeks. °Follow these instructions at home: °· Take medicines only as directed by your health care provider. °· You may shower 24-48 hours after the procedure or as directed by your health care provider. Remove the bandage (dressing) and gently wash the site with plain soap and water. Pat the area dry with a clean towel. Do not rub the site, because this may cause bleeding. °· Do not take baths, swim, or use a hot tub until your health care provider approves. °· Check your insertion site every day for redness, swelling, or drainage. °· Do not apply powder or lotion to the site. °· Do not flex or bend the affected arm for 24 hours or as directed by your health care provider. °· Do not push or pull heavy objects with the affected arm for 24 hours or as directed by your health care provider. °· Do not lift over 10 lb (4.5 kg) for 5 days after your procedure or as directed by your health care provider. °· Ask your health care provider when it is okay to: °¨ Return to work or school. °¨ Resume usual physical activities or sports. °¨ Resume sexual activity. °· Do not drive home if you are discharged the same day as the procedure. Have someone else drive you. °· You may drive 24 hours after the procedure  unless otherwise instructed by your health care provider. °· Do not operate machinery or power tools for 24 hours after the procedure. °· If your procedure was done as an outpatient procedure, which means that you went home the same day as your procedure, a responsible adult should be with you for the first 24 hours after you arrive home. °· Keep all follow-up visits as directed by your health care provider. This is important. °Contact a health care provider if: °· You have a fever. °· You have chills. °· You have increased bleeding from the radial site. Hold pressure on the site. °Get help right away if: °· You have unusual pain at the radial site. °· You have redness, warmth, or swelling at the radial site. °· You have drainage (other than a small amount of blood on the dressing) from the radial site. °· The radial site is bleeding, and the bleeding does not stop after 30 minutes of holding steady pressure on the site. °· Your arm or hand becomes pale, cool, tingly, or numb. °This information is not intended to replace advice given to you by your health care provider. Make sure you discuss any questions you have with your health care provider. °Document Released: 12/11/2010 Document Revised: 04/15/2016 Document Reviewed: 05/27/2014 °Elsevier Interactive Patient Education © 2017 [employer] ° °

## 2017-04-11 NOTE — Interval H&P Note (Signed)
History and Physical Interval Note:  04/11/2017 1:07 PM  Darin Simmons  has presented today for surgery, with the diagnosis of cad, aortic stenosis  The various methods of treatment have been discussed with the patient and family. After consideration of risks, benefits and other options for treatment, the patient has consented to  Procedure(s): Right/Left Heart Cath and Coronary Angiography (N/A) as a surgical intervention .  The patient's history has been reviewed, patient examined, no change in status, stable for surgery.  I have reviewed the patient's chart and labs.  Questions were answered to the patient's satisfaction.    Diagnostic cath only planned for aortic stenosis.   Larae Grooms

## 2017-04-11 NOTE — H&P (View-Only) (Signed)
Cardiothoracic Surgery Consultation  PCP is Darcus Austin, MD Referring Provider is Jettie Booze, MD  Chief Complaint  Patient presents with  . Aortic Stenosis    Surgical eval, ECHO 03/18/17    HPI:   The patient is a 76 year old gentleman with hypercholesterolemia, coronary artery disease s/p DES to the RCA, and aortic stenosis. His most recent echo on 03/18/2017 showed a trileaflet aortic valve with severely calcified leaflets and restricted mobility. There was severe stenosis with a mean gradient of 43 mm Hg and a peak of 80 mm Hg. The LVEF was 55-60%. The patient reports having some exertional shortness of breath and fatigue. He had an episode recently of headache and blurred vision and felt like he was going to pass out. It resolved after several minutes. He has not had that happen again. He has not had any chest pain.   He was on Plavix for a while after his stent but stopped taking it and now takes a systemic enzyme which he says acts as an anticoagulant. He is vegetarian and intermittently fasts for 12-14 hrs per day and sometimes more than 24 hrs at a time.    Past Medical History:  Diagnosis Date  . 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 Aortic stenosis   . BPH (benign prostatic hyperplasia)   . CAD (coronary artery disease) 2004  . Heart attack ([place]) 2004  . Heart murmur   . Hypercholesterolemia   . 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 PVD (peripheral vascular disease) ([place])     Past Surgical History:  Procedure Laterality Date  . DENTAL SURGERY     removed  . hair follicle removed Left   . heart stent    . HERNIA REPAIR Bilateral 7/51/02   BIH and umbilical hernia repair  . INGUINAL HERNIA REPAIR Bilateral 01/18/2013   Procedure: LAPAROSCOPIC BILATERAL INGUINAL HERNIA REPAIR;  Surgeon: Odis Hollingshead, MD;  Location: [facility];  Service: General;  Laterality: Bilateral;  . INSERTION OF MESH Bilateral 01/18/2013   Procedure: INSERTION OF MESH;   Surgeon: Odis Hollingshead, MD;  Location: [facility];  Service: General;  Laterality: Bilateral;  AND UMBILICUS  . UMBILICAL HERNIA REPAIR N/A 01/18/2013   Procedure: HERNIA REPAIR UMBILICAL ADULT;  Surgeon: Odis Hollingshead, MD;  Location: [facility];  Service: General;  Laterality: N/A;    Family History  Problem Relation Age of Onset  . Heart disease Father     Social History Social History  Substance Use Topics  . Smoking status: Former Smoker    Packs/day: 1.00    Years: 50.00    Types: Cigarettes    Quit date: 11/22/2008  . Smokeless tobacco: Never Used  . Alcohol use Yes     Comment: occasional    Current Outpatient Prescriptions  Medication Sig Dispense Refill  . aspirin EC 81 MG tablet Take 81 mg by mouth 2 (two) times daily.    . cholecalciferol (VITAMIN D) 400 UNITS TABS Take 400 Units by mouth daily.    . folic acid (FOLVITE) 585 MCG tablet Take 800 mcg by mouth daily.    Marland Kitchen GLUCOSAMINE SULFATE PO Take 2,000 mg by mouth daily.    . IODINE, KELP, PO Take by mouth.    . Magnesium 400 MG CAPS Take 400 mg by mouth daily.     . Melatonin 5 MG TABS Take 5 mg by mouth daily.    . nitroGLYCERIN (NITROSTAT) 0.4  MG SL tablet Place 0.4 mg under the tongue every 5 (five) minutes as needed for chest pain.     . potassium gluconate 595 MG TABS Take 595 mg by mouth every evening.    . pravastatin (PRAVACHOL) 20 MG tablet Take 20 mg by mouth daily.    . Selenium 200 MCG CAPS Take 200 mcg by mouth.    . vitamin C (ASCORBIC ACID) 500 MG tablet Take 500 mg by mouth 2 (two) times daily.     . vitamin E 100 UNIT capsule Take 1,000 Units by mouth once a week.    . zinc gluconate 50 MG tablet Take 50 mg by mouth daily.     No current facility-administered medications for this visit.     Allergies  Allergen Reactions  . Diphenhydramine Hcl Other (See Comments)    Shaky legs  . Bee Venom   . Crestor [Rosuvastatin]     MUSCLE PAIN  . Statins     MUSCLE PAIN    Review of Systems    Constitutional: Positive for activity change and fatigue. Negative for appetite change, chills, fever and unexpected weight change.  HENT: Negative.        Saw his dentist 03/2017  Eyes: Negative.   Respiratory: Negative.   Cardiovascular: Negative for chest pain, palpitations and leg swelling.       Shortness of breath with moderate exertion  Cramps in calves with walking  Varicose veins.  Gastrointestinal: Negative.   Endocrine: Negative.   Genitourinary: Positive for frequency.       BPH  Musculoskeletal: Negative.   Skin: Negative.   Allergic/Immunologic: Negative.   Neurological: Negative.   Hematological: Negative.   Psychiatric/Behavioral: Negative.     BP 123/75   Pulse 60   Resp 20   Ht 5\' 10"  (1.778 m)   Wt 143 lb (64.9 kg)   SpO2 98% Comment: RA  BMI 20.52 kg/m  Physical Exam  Constitutional: He is oriented to person, place, and time. He appears well-developed and well-nourished. No distress.  HENT:  Head: Normocephalic and atraumatic.  Mouth/Throat: Oropharynx is clear and moist.  Teeth in fair condition  Eyes: Conjunctivae and EOM are normal. Pupils are equal, round, and reactive to light.  Neck: Normal range of motion. Neck supple. No JVD present. No thyromegaly present.  Cardiovascular: Normal rate and regular rhythm.   Murmur heard. III/VI systolic murmur RSB  Pulmonary/Chest: Effort normal and breath sounds normal. No respiratory distress.  Abdominal: Soft. Bowel sounds are normal. He exhibits no distension and no mass. There is no tenderness.  Musculoskeletal: Normal range of motion. He exhibits no edema.  Lymphadenopathy:    He has no cervical adenopathy.  Neurological: He is alert and oriented to person, place, and time. He has normal strength. No sensory deficit.  Skin: Skin is warm and dry.  Psychiatric: He has a normal mood and affect.     Diagnostic Tests:     [facility]                        [address], [place] [address]                            [phone]  ------------------------------------------------------------------- Transthoracic Echocardiography  Patient:  Darin Simmons, Darin Simmons MR #:       [id number] Study Date: 03/18/2017 Gender:     M Age:        37 Height:     177.8 cm Weight:     64.9 kg BSA:        1.78 m^2 Pt. Status: Room:   SONOGRAPHER  [facility], Darin Simmons, Outpatient  cc:  ------------------------------------------------------------------- LV EF: 55% -   60%  ------------------------------------------------------------------- Indications:      Aortic stenosis - I35.0.  ------------------------------------------------------------------- History:   PMH:   Murmur.  Coronary artery disease.  PMH: Myocardial infarction.  Risk factors:  Dyslipidemia.  ------------------------------------------------------------------- Study Conclusions  - Left ventricle: The cavity size was normal. Wall thickness was   increased in a pattern of moderate LVH. Systolic function was   normal. The estimated ejection fraction was in the range of 55%   to 60%. Wall motion was normal; there were no regional wall   motion abnormalities. Doppler parameters are consistent with   abnormal left ventricular relaxation (grade 1 diastolic   dysfunction). - Aortic valve: Valve mobility was restricted. There was severe   stenosis. There was mild regurgitation. Valve area (VTI): 0.65   cm^2. Valve area (Vmax): 0.63 cm^2. Valve area (Vmean): 0.61   cm^2.  Impressions:  - Normal LV systolic function; milld diastolic dysfuncton; moderate   LVH; calcified aortic valve with severe AS (mean gradient 43   mmHg); mild AI.  ------------------------------------------------------------------- Study data:  Comparison was made to the study of 01/28/2012.   Study status:  Routine.  Procedure:  The patient reported no pain pre or post test. Transthoracic echocardiography. Image quality was adequate.  Study completion:  There were no complications. Transthoracic echocardiography.  M-mode, complete 2D, spectral Doppler, and color Doppler.  Birthdate:  Patient birthdate: [birth year].  Age:  Patient is 76 yr old.  Sex:  Gender: male. BMI: 20.5 kg/m^2.  Blood pressure:     126/80  Patient status: Outpatient.  Study date:  Study date: 03/18/2017. Study time: 09:18 AM.  Location:  [facility]  -------------------------------------------------------------------  ------------------------------------------------------------------- Left ventricle:  The cavity size was normal. Wall thickness was increased in a pattern of moderate LVH. Systolic function was normal. The estimated ejection fraction was in the range of 55% to 60%. Wall motion was normal; there were no regional wall motion abnormalities. Doppler parameters are consistent with abnormal left ventricular relaxation (grade 1 diastolic dysfunction).  ------------------------------------------------------------------- Aortic valve:   Trileaflet; severely calcified leaflets. Valve mobility was restricted.  Doppler:   There was severe stenosis. There was mild regurgitation.    VTI ratio of LVOT to aortic valve: 0.19. Valve area (VTI): 0.65 cm^2. Indexed valve area (VTI): 0.36 cm^2/m^2. Peak velocity ratio of LVOT to aortic valve: 0.18. Valve area (Vmax): 0.63 cm^2. Indexed valve area (Vmax): 0.35 cm^2/m^2. Mean velocity ratio of LVOT to aortic valve: 0.18. Valve area (Vmean): 0.61 cm^2. Indexed valve area (Vmean): 0.34 cm^2/m^2. Mean gradient (S): 43 mm Hg. Peak gradient (S): 80 mm Hg.  ------------------------------------------------------------------- Aorta:  Aortic root: The aortic root was normal in  size.  ------------------------------------------------------------------- Mitral valve:   Structurally normal valve.   Mobility was not restricted.  Doppler:  Transvalvular velocity was within the normal range. There was no evidence for stenosis. There was trivial regurgitation.  Indexed valve area by pressure half-time: 0.92 cm^2/m^2.  ------------------------------------------------------------------- Left atrium:  The atrium was normal in size.  ------------------------------------------------------------------- Right ventricle:  The cavity size was normal. Systolic function was normal.  ------------------------------------------------------------------- Pulmonic valve:    Doppler:  Transvalvular velocity was within the normal range. There was no evidence for stenosis.  ------------------------------------------------------------------- Tricuspid valve:   Structurally normal valve.    Doppler: Transvalvular velocity was within the normal range. There was trivial regurgitation.  ------------------------------------------------------------------- Right atrium:  The atrium was normal in size.  ------------------------------------------------------------------- Pericardium:  There was no pericardial effusion.  ------------------------------------------------------------------- Systemic veins: Inferior vena cava: The vessel was normal in size.  ------------------------------------------------------------------- Measurements   Left ventricle                           Value          Reference  LV ID, ED, PLAX chordal                  45    mm       43 - 52  LV ID, ES, PLAX chordal                  36    mm       23 - 38  LV fx shortening, PLAX chordal   (L)     20    %        >=29  LV PW thickness, ED                      12    mm       ----------  IVS/LV PW ratio, ED                      1.08           <=1.Simmons  Stroke volume, 2D                        76    ml        ----------  Stroke volume/bsa, 2D                    43    ml/m^2   ----------  LV e&', lateral                           6.64  cm/s     ----------  LV E/e&', lateral                         7.89           ----------  LV e&', medial                            4.9   cm/s     ----------  LV E/e&', medial                          10.69          ----------  LV e&', average                           5.77  cm/s     ----------  LV E/e&', average                         9.08           ----------    Ventricular septum                       Value          Reference  IVS thickness, ED                        13    mm       ----------    LVOT                                     Value          Reference  LVOT ID, S                               21    mm       ----------  LVOT area                                Simmons.46  cm^2     ----------  LVOT peak velocity, S                    81.Simmons  cm/s     ----------  LVOT mean velocity, S                    53.6  cm/s     ----------  LVOT VTI, S                              22    cm       ----------    Aortic valve                             Value          Reference  Aortic valve peak velocity, S            446   cm/s     ----------  Aortic valve mean velocity, S            303   cm/s     ----------  Aortic valve VTI, S                      117   cm       ----------  Aortic mean gradient, S                  43    mm Hg    ----------  Aortic peak gradient, S                  80    mm Hg    ----------  VTI ratio, LVOT/AV                       0.19           ----------  Aortic valve area, VTI                   0.65  cm^2     ----------  Aortic valve area/bsa, VTI               0.36  cm^2/m^2 ----------  Velocity ratio, peak, LVOT/AV            0.18           ----------  Aortic valve area, peak velocity         0.63  cm^2     ----------  Aortic valve area/bsa, peak              0.35  cm^2/m^2 ----------  velocity  Velocity ratio, mean, LVOT/AV            0.18            ----------  Aortic valve area, mean velocity         0.61  cm^2     ----------  Aortic valve area/bsa, mean              0.34  cm^2/m^2 ----------  velocity  Aortic regurg peak velocity              276   cm/s     ----------  Aortic regurg pressure half-time         695   ms       ----------  Aortic regurg peak gradient              30    mm Hg    ----------    Aorta                                    Value          Reference  Aortic root ID, ED                       31    mm       ----------    Left atrium                              Value          Reference  LA ID, A-P, ES                           29    mm       ----------  LA ID/bsa, A-P                           1.62  cm/m^2   <=2.2  LA volume, S                             54    ml       ----------  LA volume/bsa, S                         30.Simmons  ml/m^2   ----------  LA volume, ES, 1-p A4C                   37.4  ml       ----------  LA volume/bsa, ES, 1-p A4C               21    ml/m^2   ----------  LA volume, ES, 1-p A2C                   63.Simmons  ml       ----------  LA volume/bsa, ES, 1-p A2C               35.5  ml/m^2   ----------    Mitral valve                             Value          Reference  Mitral E-wave peak velocity              52.4  cm/s     ----------  Mitral A-wave peak velocity              83.9  cm/s     ----------  Mitral deceleration time         (H)     521   ms       150 - 230  Mitral pressure half-time                153   ms       ----------  Mitral E/A ratio, peak                   0.6            ----------  Mitral valve area/bsa, PHT, DP           0.92  cm^2/m^2 ----------    Right atrium                             Value          Reference  RA ID, S-I, ES, A4C              (H)     54    mm       34 - 49  RA area, ES, A4C                         15.6  cm^2     8.Simmons - 19.5  RA volume, ES, A/L                       37.Simmons  ml       ----------  RA volume/bsa, ES, A/L                   20.9  ml/m^2    ----------    Right ventricle                          Value          Reference  RV s&', lateral, S                        6.96  cm/s     ----------  Legend: (L)  and  (H)  mark values outside specified reference range.  ------------------------------------------------------------------- Prepared and Electronically Authenticated by  Kirk Ruths 2018-04-27T10:37:53   Impression:  This 76 year old gentleman as stage  D severe, symptomatic aortic stenosis with NYHA class II symptoms of exertional fatigue and shortness of breath consistent with chronic diastolic heart failure. In addition he recently had an episode of marked dizziness and blurred vision and near syncope. I have personally reviewed his echo and there is a trileaflet aortic valve with severely calcified leaflets and restricted mobility with severe stenosis and a mean gradient of 43 mm Hg. I agree that AVR is indicated in this patient. He is at low risk for open surgical AVR and is not a TAVR candidate. He will require a cardiac cath and I think he should have a CTA of the chest to assess the aortic size. He had a CT of the chest in 2011 and the ascending aorta was Simmons.2 cm compared to 2.4 cm for the descending aorta. I would plan to use a bioprosthetic valve given his age. I discussed the operative procedure with the patient  including alternatives, benefits and risks; including but not limited to bleeding, blood transfusion, infection, stroke, myocardial infarction, heart block requiring a permanent pacemaker, organ dysfunction, and death.  [facility] understands and agrees to proceed.    Plan:  1. He will be scheduled for cardiac cath by Dr. Irish Lack.  2. He will be scheduled for CTA of the chest to assess the aorta.  Simmons. Then he will be scheduled for AVR +/- CABG depending on the status of his coronary arteries.  I spent 60 minutes performing this consultation and > 50% of this time was spent face to face counseling and  coordinating the care of this patient's severe aortic stenosis.   Gaye Pollack, MD [facility] [phone]

## 2017-04-12 ENCOUNTER — Telehealth: Payer: Self-pay | Admitting: Interventional Cardiology

## 2017-04-12 NOTE — Telephone Encounter (Signed)
Follow Up   [facility] is calling back to follow up on his call about his hand swollen at the injection site .Marland Kitchen Please call

## 2017-04-12 NOTE — Telephone Encounter (Signed)
Patient calling complaining of swelling in his right hand. Patient states that he made his own sling for his right arm. He states that he thought that it was restricting blood flow to his right hand causing swelling, so he removed it. He states that since he removed it that his swelling has improved. Patient denies any pain, bleeding or hematoma at the site. Patient denies having any numbness, tingling, or color or temperature changes. Patient advised to rest, elevate, and continue to monitor. Patient verbalized understanding and was in agreement with this plan.

## 2017-04-12 NOTE — Telephone Encounter (Signed)
New Message    Had procedure done yesterday, his hand is extremely  swollen at the injection site.    Could the bandage be restricting blood flow?

## 2017-04-13 ENCOUNTER — Other Ambulatory Visit: Payer: Self-pay | Admitting: *Deleted

## 2017-04-13 DIAGNOSIS — I350 Nonrheumatic aortic (valve) stenosis: Secondary | ICD-10-CM

## 2017-04-20 ENCOUNTER — Ambulatory Visit (HOSPITAL_COMMUNITY)
Admission: RE | Admit: 2017-04-20 | Discharge: 2017-04-20 | Disposition: A | Discharge status: Home / Self Care | Payer: Medicare Other | Source: Ambulatory Visit | Attending: Surgery | Admitting: Surgery

## 2017-04-20 ENCOUNTER — Encounter (HOSPITAL_COMMUNITY)
Admission: RE | Admit: 2017-04-20 | Discharge: 2017-04-20 | Disposition: A | Discharge status: Home / Self Care | Payer: Medicare Other | Source: Ambulatory Visit | Attending: Surgery | Admitting: Surgery

## 2017-04-20 ENCOUNTER — Encounter (HOSPITAL_COMMUNITY): Payer: Self-pay

## 2017-04-20 ENCOUNTER — Ambulatory Visit (HOSPITAL_BASED_OUTPATIENT_CLINIC_OR_DEPARTMENT_OTHER)
Admission: RE | Admit: 2017-04-20 | Discharge: 2017-04-20 | Disposition: A | Discharge status: Home / Self Care | Payer: Medicare Other | Source: Ambulatory Visit | Attending: Surgery | Admitting: Surgery

## 2017-04-20 DIAGNOSIS — Z01812 Encounter for preprocedural laboratory examination: Secondary | ICD-10-CM

## 2017-04-20 DIAGNOSIS — Z0183 Encounter for blood typing: Secondary | ICD-10-CM | POA: Diagnosis not present

## 2017-04-20 DIAGNOSIS — T801XXA Vascular complications following infusion, transfusion and therapeutic injection, initial encounter: Secondary | ICD-10-CM | POA: Diagnosis not present

## 2017-04-20 DIAGNOSIS — I2510 Atherosclerotic heart disease of native coronary artery without angina pectoris: Secondary | ICD-10-CM | POA: Diagnosis not present

## 2017-04-20 DIAGNOSIS — I350 Nonrheumatic aortic (valve) stenosis: Secondary | ICD-10-CM | POA: Diagnosis not present

## 2017-04-20 DIAGNOSIS — I5032 Chronic diastolic (congestive) heart failure: Secondary | ICD-10-CM | POA: Diagnosis not present

## 2017-04-20 DIAGNOSIS — J9811 Atelectasis: Secondary | ICD-10-CM | POA: Diagnosis not present

## 2017-04-20 DIAGNOSIS — R443 Hallucinations, unspecified: Secondary | ICD-10-CM | POA: Diagnosis not present

## 2017-04-20 DIAGNOSIS — Z01818 Encounter for other preprocedural examination: Secondary | ICD-10-CM

## 2017-04-20 DIAGNOSIS — D62 Acute posthemorrhagic anemia: Secondary | ICD-10-CM | POA: Diagnosis not present

## 2017-04-20 DIAGNOSIS — E7800 Pure hypercholesterolemia, unspecified: Secondary | ICD-10-CM | POA: Diagnosis not present

## 2017-04-20 DIAGNOSIS — Z01811 Encounter for preprocedural respiratory examination: Secondary | ICD-10-CM | POA: Diagnosis not present

## 2017-04-20 HISTORY — DX: Unspecified osteoarthritis, unspecified site: M19.90

## 2017-04-20 HISTORY — DX: Dyspnea, unspecified: R06.00

## 2017-04-20 LAB — CBC
HEMATOCRIT: 43.2 % (ref 39.0–52.0)
HEMOGLOBIN: 15 g/dL (ref 13.0–17.0)
MCH: 31.5 pg (ref 26.0–34.0)
MCHC: 34.7 g/dL (ref 30.0–36.0)
MCV: 90.8 fL (ref 78.0–100.0)
Platelets: 125 10*3/uL — ABNORMAL LOW (ref 150–400)
RBC: 4.76 MIL/uL (ref 4.22–5.81)
RDW: 12.8 % (ref 11.5–15.5)
WBC: 6.2 10*3/uL (ref 4.0–10.5)

## 2017-04-20 LAB — COMPREHENSIVE METABOLIC PANEL
ALBUMIN: 4.2 g/dL (ref 3.5–5.0)
ALK PHOS: 69 U/L (ref 38–126)
ALT: 20 U/L (ref 17–63)
ANION GAP: 9 (ref 5–15)
AST: 31 U/L (ref 15–41)
BUN: 9 mg/dL (ref 6–20)
CO2: 21 mmol/L — ABNORMAL LOW (ref 22–32)
Calcium: 8.8 mg/dL — ABNORMAL LOW (ref 8.9–10.3)
Chloride: 108 mmol/L (ref 101–111)
Creatinine, Ser: 0.61 mg/dL (ref 0.61–1.24)
GFR calc Af Amer: >60 mL/min (ref >60)
GLUCOSE: 94 mg/dL (ref 65–99)
Potassium: 3.6 mmol/L (ref 3.5–5.1)
Sodium: 138 mmol/L (ref 135–145)
Total Bilirubin: 1.2 mg/dL (ref 0.3–1.2)
Total Protein: 6.6 g/dL (ref 6.5–8.1)

## 2017-04-20 LAB — PULMONARY FUNCTION TEST
DL/VA % pred: 63 %
DL/VA: 2.9 ml/min/mmHg/L
DLCO UNC: 18.83 ml/min/mmHg
DLCO unc % pred: 58 %
FEF 25-75 POST: 2.16 L/s
FEF 25-75 Pre: 1.94 L/sec
FEF2575-%Change-Post: 11 %
FEF2575-%Pred-Post: 94 %
FEF2575-%Pred-Pre: 84 %
FEV1-%CHANGE-POST: 3 %
FEV1-%PRED-PRE: 90 %
FEV1-%Pred-Post: 94 %
FEV1-POST: 2.93 L
FEV1-PRE: 2.83 L
FEV1FVC-%Change-Post: 4 %
FEV1FVC-%PRED-PRE: 98 %
FEV6-%Change-Post: 0 %
FEV6-%PRED-POST: 95 %
FEV6-%PRED-PRE: 95 %
FEV6-POST: 3.85 L
FEV6-Pre: 3.85 L
FEV6FVC-%CHANGE-POST: 1 %
FEV6FVC-%PRED-POST: 105 %
FEV6FVC-%Pred-Pre: 104 %
FVC-%Change-Post: -1 %
FVC-%PRED-PRE: 91 %
FVC-%Pred-Post: 90 %
FVC-POST: 3.89 L
FVC-PRE: 3.93 L
POST FEV6/FVC RATIO: 99 %
PRE FEV1/FVC RATIO: 72 %
PRE FEV6/FVC RATIO: 98 %
Post FEV1/FVC ratio: 75 %
RV % pred: 132 %
RV: 3.35 L
TLC % PRED: 102 %
TLC: 7.23 L

## 2017-04-20 LAB — BLOOD GAS, ARTERIAL
Acid-base deficit: 1.3 mmol/L (ref 0.0–2.0)
BICARBONATE: 22.6 mmol/L (ref 20.0–28.0)
Drawn by: 421801
FIO2: 21
O2 Saturation: 97.3 %
PATIENT TEMPERATURE: 98.6
PCO2 ART: 35.7 mmHg (ref 32.0–48.0)
PH ART: 7.417 (ref 7.350–7.450)
PO2 ART: 96.4 mmHg (ref 83.0–108.0)

## 2017-04-20 LAB — URINALYSIS, ROUTINE W REFLEX MICROSCOPIC
BILIRUBIN URINE: NEGATIVE
GLUCOSE, UA: NEGATIVE mg/dL
HGB URINE DIPSTICK: NEGATIVE
KETONES UR: 5 mg/dL — AB
LEUKOCYTES UA: NEGATIVE
Nitrite: NEGATIVE
PROTEIN: NEGATIVE mg/dL
Specific Gravity, Urine: 1.012 (ref 1.005–1.030)
pH: 6 (ref 5.0–8.0)

## 2017-04-20 LAB — SURGICAL PCR SCREEN
MRSA, PCR: NEGATIVE
STAPHYLOCOCCUS AUREUS: NEGATIVE

## 2017-04-20 LAB — TYPE AND SCREEN
ABO/RH(D): A POS
ANTIBODY SCREEN: NEGATIVE

## 2017-04-20 LAB — PROTIME-INR
INR: 1.08
Prothrombin Time: 14 seconds (ref 11.4–15.2)

## 2017-04-20 LAB — APTT: aPTT: 30 seconds (ref 24–36)

## 2017-04-20 LAB — ABO/RH: ABO/RH(D): A POS

## 2017-04-20 MED ORDER — ALBUTEROL SULFATE (2.5 MG/3ML) 0.083% IN NEBU
2.5000 mg | INHALATION_SOLUTION | Freq: Once | RESPIRATORY_TRACT | Status: AC
  Administered 2017-04-20: 2.5 mg via RESPIRATORY_TRACT

## 2017-04-20 NOTE — Progress Notes (Signed)
Cardiologist:Dr. Irish Lack  PCP: Dr.Donna Inda Merlin

## 2017-04-20 NOTE — Pre-Procedure Instructions (Addendum)
Darin Simmons  04/20/2017      [place] ([facility] [place] [address] [place] ([place]) [place] [address] Phone: [phone] Fax: [phone]    Your procedure is scheduled on Fri. June 1  Report to [facility] Admitting at 5:30 A.M.  Call this number if you have problems the morning of surgery:  [phone]   Remember:  Do not eat food or drink liquids after midnight on Thurs. May 31   Take these medicines the morning of surgery with A SIP OF WATER : nitroglycerine if needed              Stop: motrin, aleve, ibuprofen, motrin, BC Powders,goody's, vitamins/herbal medicines.   Do not wear jewelry.  Do not wear lotions, powders, or perfumes, or deoderant.  Do not shave 48 hours prior to surgery.  Men may shave face and neck.  Do not bring valuables to the hospital.  [facility] is not responsible for any belongings or valuables.  Contacts, dentures or bridgework may not be worn into surgery.  Leave your suitcase in the car.  After surgery it may be brought to your room.  For patients admitted to the hospital, discharge time will be determined by your treatment team.  Patients discharged the day of surgery will not be allowed to drive home.    Special instructions:  [facility]- Preparing For Surgery  Before surgery, you can play an important role. Because skin is not sterile, your skin needs to be as free of germs as possible. You can reduce the number of germs on your skin by washing with CHG (chlorahexidine gluconate) Soap before surgery.  CHG is an antiseptic cleaner which kills germs and bonds with the skin to continue killing germs even after washing.  Please do not use if you have an allergy to CHG or antibacterial soaps. If your skin becomes reddened/irritated stop using the CHG.  Do not shave (including legs and underarms) for at least 48 hours prior to first CHG shower. It is OK to shave your face.  Please  follow these instructions carefully.   1. Shower the NIGHT BEFORE SURGERY and the MORNING OF SURGERY with CHG.   2. If you chose to wash your hair, wash your hair first as usual with your normal shampoo.  3. After you shampoo, rinse your hair and body thoroughly to remove the shampoo.  4. Use CHG as you would any other liquid soap. You can apply CHG directly to the skin and wash gently with a scrungie or a clean washcloth.   5. Apply the CHG Soap to your body ONLY FROM THE NECK DOWN.  Do not use on open wounds or open sores. Avoid contact with your eyes, ears, mouth and genitals (private parts). Wash genitals (private parts) with your normal soap.  6. Wash thoroughly, paying special attention to the area where your surgery will be performed.  7. Thoroughly rinse your body with warm water from the neck down.  8. DO NOT shower/wash with your normal soap after using and rinsing off the CHG Soap.  9. Pat yourself dry with a CLEAN TOWEL.   10. Wear CLEAN PAJAMAS   11. Place CLEAN SHEETS on your bed the night of your first shower and DO NOT SLEEP WITH PETS.    Day of Surgery: Do not apply any deodorants/lotions. Please wear clean clothes to the hospital/surgery center.      Please  read over the following fact sheets that you were given. Coughing and Deep Breathing, MRSA Information and Surgical Site Infection Prevention

## 2017-04-20 NOTE — Progress Notes (Signed)
Pre-op Cardiac Surgery  Carotid Findings:  Findings suggest 1-39% internal carotid artery stenosis bilaterally. Vertebral arteries are patent with antegrade flow.  Upper Extremity Right Left  Brachial Pressures 111-Triphasic 118-Triphasic  Radial Waveforms Triphasic Triphasic  Ulnar Waveforms Triphasic Triphasic  Palmar Arch (Allen's Test) Within normal limits Within normal limits.    04/20/2017 12:24 PM Maudry Mayhew, BS, RVT, RDCS, RDMS

## 2017-04-21 LAB — VAS US DOPPLER PRE CABG
LCCADDIAS: -18 cm/s
LCCADSYS: -59 cm/s
LCCAPSYS: 74 cm/s
LEFT ECA DIAS: -9 cm/s
LEFT VERTEBRAL DIAS: 4 cm/s
LICADSYS: -67 cm/s
Left CCA prox dias: 16 cm/s
Left ICA dist dias: -26 cm/s
Left ICA prox dias: -18 cm/s
Left ICA prox sys: -63 cm/s
RCCADSYS: -54 cm/s
RCCAPDIAS: 14 cm/s
RCCAPSYS: 64 cm/s
RIGHT ECA DIAS: -9 cm/s
RIGHT VERTEBRAL DIAS: 8 cm/s

## 2017-04-21 LAB — HEMOGLOBIN A1C
Hgb A1c MFr Bld: 4.9 % (ref 4.8–5.6)
Mean Plasma Glucose: 94 mg/dL

## 2017-04-21 MED ORDER — POTASSIUM CHLORIDE 2 MEQ/ML IV SOLN
80.0000 meq | INTRAVENOUS | Status: DC
  Filled 2017-04-21: qty 40

## 2017-04-21 MED ORDER — DEXTROSE 5 % IV SOLN
750.0000 mg | INTRAVENOUS | Status: DC
  Filled 2017-04-21: qty 750

## 2017-04-21 MED ORDER — CHLORHEXIDINE GLUCONATE 0.12 % MT SOLN
15.0000 mL | OROMUCOSAL | Status: AC
  Administered 2017-04-22: 15 mL via OROMUCOSAL
  Filled 2017-04-21: qty 15

## 2017-04-21 MED ORDER — HEPARIN SODIUM (PORCINE) 1000 UNIT/ML IJ SOLN
INTRAMUSCULAR | Status: DC
Start: 2017-04-22 — End: 2017-04-22
  Filled 2017-04-21: qty 30

## 2017-04-21 MED ORDER — TRANEXAMIC ACID (OHS) BOLUS VIA INFUSION
15.0000 mg/kg | INTRAVENOUS | Status: AC
  Administered 2017-04-22: 976.5 mg via INTRAVENOUS
  Filled 2017-04-21: qty 977

## 2017-04-21 MED ORDER — DEXMEDETOMIDINE HCL IN NACL 400 MCG/100ML IV SOLN
0.1000 ug/kg/h | INTRAVENOUS | Status: AC
  Administered 2017-04-22: .5 ug/kg/h via INTRAVENOUS
  Filled 2017-04-21: qty 100

## 2017-04-21 MED ORDER — SODIUM CHLORIDE 0.9 % IV SOLN
INTRAVENOUS | Status: AC
  Administered 2017-04-22: .7 [IU]/h via INTRAVENOUS
  Filled 2017-04-21: qty 1

## 2017-04-21 MED ORDER — EPINEPHRINE PF 1 MG/ML IJ SOLN
0.0000 ug/min | INTRAVENOUS | Status: DC
  Filled 2017-04-21: qty 4

## 2017-04-21 MED ORDER — NITROGLYCERIN IN D5W 200-5 MCG/ML-% IV SOLN
2.0000 ug/min | INTRAVENOUS | Status: AC
  Administered 2017-04-22: 20 ug/min via INTRAVENOUS
  Filled 2017-04-21: qty 250

## 2017-04-21 MED ORDER — DOPAMINE-DEXTROSE 3.2-5 MG/ML-% IV SOLN
0.0000 ug/kg/min | INTRAVENOUS | Status: DC
  Filled 2017-04-21: qty 250

## 2017-04-21 MED ORDER — METOPROLOL TARTRATE 12.5 MG HALF TABLET: 12.5000 mg | ORAL_TABLET | Freq: Once | ORAL | Status: DC

## 2017-04-21 MED ORDER — TRANEXAMIC ACID 1000 MG/10ML IV SOLN
1.5000 mg/kg/h | INTRAVENOUS | Status: AC
  Administered 2017-04-22: 1.5 mg/kg/h via INTRAVENOUS
  Filled 2017-04-21: qty 25

## 2017-04-21 MED ORDER — VANCOMYCIN HCL 10 G IV SOLR
1250.0000 mg | INTRAVENOUS | Status: AC
  Administered 2017-04-22: 1250 mg via INTRAVENOUS
  Filled 2017-04-21: qty 1250

## 2017-04-21 MED ORDER — CEFUROXIME SODIUM 1.5 G IV SOLR
1.5000 g | INTRAVENOUS | Status: AC
  Administered 2017-04-22: 750 mg via INTRAVENOUS
  Administered 2017-04-22: 1500 mg via INTRAVENOUS
  Filled 2017-04-21: qty 1.5

## 2017-04-21 MED ORDER — TRANEXAMIC ACID (OHS) PUMP PRIME SOLUTION
2.0000 mg/kg | INTRAVENOUS | Status: DC
  Filled 2017-04-21: qty 1.3

## 2017-04-21 MED ORDER — MAGNESIUM SULFATE 50 % IJ SOLN
40.0000 meq | INTRAMUSCULAR | Status: DC
  Filled 2017-04-21: qty 10

## 2017-04-21 MED ORDER — PLASMA-LYTE 148 IV SOLN
INTRAVENOUS | Status: DC
  Filled 2017-04-21: qty 2.5

## 2017-04-21 MED ORDER — SODIUM CHLORIDE 0.9 % IV SOLN
30.0000 ug/min | INTRAVENOUS | Status: AC
  Administered 2017-04-22: 20 ug/min via INTRAVENOUS
  Filled 2017-04-21: qty 2

## 2017-04-21 NOTE — Anesthesia Preprocedure Evaluation (Addendum)
Anesthesia Evaluation  Patient identified by MRN, date of birth, ID band Patient awake    Reviewed: Allergy & Precautions, NPO status , Patient's Chart, lab work & pertinent test results  History of Anesthesia Complications Negative for: history of anesthetic complications  Airway Mallampati: II  TM Distance: >3 FB Neck ROM: Full    Dental  (+) Edentulous Upper, Dental Advisory Given   Pulmonary shortness of breath, former smoker,    Pulmonary exam normal        Cardiovascular + CAD, + Past MI, + Cardiac Stents and + Peripheral Vascular Disease  + Valvular Problems/Murmurs AS  Rhythm:Regular + Systolic murmurs  Impressions:  - Normal LV systolic function; milld diastolic dysfuncton; moderate   LVH; calcified aortic valve with severe AS (mean gradient 43   mmHg); mild AI.    Neuro/Psych negative neurological ROS  negative psych ROS   GI/Hepatic negative GI ROS, Neg liver ROS,   Endo/Other  negative endocrine ROS  Renal/GU negative Renal ROS     Musculoskeletal negative musculoskeletal ROS (+)   Abdominal   Peds  Hematology negative hematology ROS (+)   Anesthesia Other Findings Day of surgery medications reviewed with the patient.  Reproductive/Obstetrics                           Anesthesia Physical Anesthesia Plan  ASA: IV  Anesthesia Plan: General   Post-op Pain Management:    Induction: Intravenous  Airway Management Planned: Oral ETT  Additional Equipment: Arterial line, PA Cath, 3D TEE and Ultrasound Guidance Line Placement  Intra-op Plan:   Post-operative Plan: Post-operative intubation/ventilation  Informed Consent: I have reviewed the patients History and Physical, chart, labs and discussed the procedure including the risks, benefits and alternatives for the proposed anesthesia with the patient or authorized representative who has indicated his/her understanding  and acceptance.   Dental advisory given  Plan Discussed with: CRNA, Anesthesiologist and Surgeon  Anesthesia Plan Comments:        Anesthesia Quick Evaluation

## 2017-04-22 ENCOUNTER — Inpatient Hospital Stay (HOSPITAL_COMMUNITY): Payer: Medicare Other | Admitting: Certified Registered Nurse Anesthetist

## 2017-04-22 ENCOUNTER — Inpatient Hospital Stay (HOSPITAL_COMMUNITY): Payer: Medicare Other

## 2017-04-22 ENCOUNTER — Inpatient Hospital Stay (HOSPITAL_COMMUNITY)
Admission: RE | Admit: 2017-04-22 | Discharge: 2017-04-28 | DRG: 220 | Disposition: A | Discharge status: Home / Self Care | Payer: Medicare Other | Source: Ambulatory Visit | Attending: Surgery | Admitting: Surgery

## 2017-04-22 ENCOUNTER — Encounter (HOSPITAL_COMMUNITY): Payer: Self-pay | Admitting: Certified Registered Nurse Anesthetist

## 2017-04-22 ENCOUNTER — Encounter (HOSPITAL_COMMUNITY)
Admission: RE | Disposition: A | Discharge status: Home / Self Care | Payer: Self-pay | Source: Ambulatory Visit | Attending: Surgery

## 2017-04-22 DIAGNOSIS — Z955 Presence of coronary angioplasty implant and graft: Secondary | ICD-10-CM | POA: Diagnosis not present

## 2017-04-22 DIAGNOSIS — T801XXA Vascular complications following infusion, transfusion and therapeutic injection, initial encounter: Secondary | ICD-10-CM | POA: Diagnosis not present

## 2017-04-22 DIAGNOSIS — Z01818 Encounter for other preprocedural examination: Secondary | ICD-10-CM | POA: Diagnosis not present

## 2017-04-22 DIAGNOSIS — I493 Ventricular premature depolarization: Secondary | ICD-10-CM | POA: Diagnosis not present

## 2017-04-22 DIAGNOSIS — I700 Atherosclerosis of aorta: Secondary | ICD-10-CM | POA: Diagnosis present

## 2017-04-22 DIAGNOSIS — Q676 Pectus excavatum: Secondary | ICD-10-CM

## 2017-04-22 DIAGNOSIS — I2510 Atherosclerotic heart disease of native coronary artery without angina pectoris: Secondary | ICD-10-CM | POA: Diagnosis present

## 2017-04-22 DIAGNOSIS — Z0183 Encounter for blood typing: Secondary | ICD-10-CM

## 2017-04-22 DIAGNOSIS — J9811 Atelectasis: Secondary | ICD-10-CM | POA: Diagnosis not present

## 2017-04-22 DIAGNOSIS — R0602 Shortness of breath: Secondary | ICD-10-CM | POA: Diagnosis present

## 2017-04-22 DIAGNOSIS — I350 Nonrheumatic aortic (valve) stenosis: Principal | ICD-10-CM | POA: Diagnosis present

## 2017-04-22 DIAGNOSIS — D6959 Other secondary thrombocytopenia: Secondary | ICD-10-CM | POA: Diagnosis not present

## 2017-04-22 DIAGNOSIS — E7800 Pure hypercholesterolemia, unspecified: Secondary | ICD-10-CM | POA: Diagnosis present

## 2017-04-22 DIAGNOSIS — Z952 Presence of prosthetic heart valve: Secondary | ICD-10-CM | POA: Diagnosis not present

## 2017-04-22 DIAGNOSIS — E782 Mixed hyperlipidemia: Secondary | ICD-10-CM | POA: Diagnosis not present

## 2017-04-22 DIAGNOSIS — L089 Local infection of the skin and subcutaneous tissue, unspecified: Secondary | ICD-10-CM | POA: Diagnosis not present

## 2017-04-22 DIAGNOSIS — I739 Peripheral vascular disease, unspecified: Secondary | ICD-10-CM | POA: Diagnosis present

## 2017-04-22 DIAGNOSIS — Z91030 Bee allergy status: Secondary | ICD-10-CM

## 2017-04-22 DIAGNOSIS — Z87891 Personal history of nicotine dependence: Secondary | ICD-10-CM

## 2017-04-22 DIAGNOSIS — Z79899 Other long term (current) drug therapy: Secondary | ICD-10-CM

## 2017-04-22 DIAGNOSIS — I808 Phlebitis and thrombophlebitis of other sites: Secondary | ICD-10-CM | POA: Diagnosis not present

## 2017-04-22 DIAGNOSIS — R443 Hallucinations, unspecified: Secondary | ICD-10-CM | POA: Diagnosis not present

## 2017-04-22 DIAGNOSIS — I110 Hypertensive heart disease with heart failure: Secondary | ICD-10-CM | POA: Diagnosis present

## 2017-04-22 DIAGNOSIS — I083 Combined rheumatic disorders of mitral, aortic and tricuspid valves: Secondary | ICD-10-CM | POA: Diagnosis not present

## 2017-04-22 DIAGNOSIS — D62 Acute posthemorrhagic anemia: Secondary | ICD-10-CM | POA: Diagnosis not present

## 2017-04-22 DIAGNOSIS — Z953 Presence of xenogenic heart valve: Secondary | ICD-10-CM

## 2017-04-22 DIAGNOSIS — Z01812 Encounter for preprocedural laboratory examination: Secondary | ICD-10-CM | POA: Diagnosis not present

## 2017-04-22 DIAGNOSIS — I358 Other nonrheumatic aortic valve disorders: Secondary | ICD-10-CM | POA: Diagnosis not present

## 2017-04-22 DIAGNOSIS — T148XXA Other injury of unspecified body region, initial encounter: Secondary | ICD-10-CM

## 2017-04-22 DIAGNOSIS — Z01811 Encounter for preprocedural respiratory examination: Secondary | ICD-10-CM

## 2017-04-22 DIAGNOSIS — Z7982 Long term (current) use of aspirin: Secondary | ICD-10-CM

## 2017-04-22 DIAGNOSIS — T814XXA Infection following a procedure, initial encounter: Secondary | ICD-10-CM | POA: Diagnosis not present

## 2017-04-22 DIAGNOSIS — R918 Other nonspecific abnormal finding of lung field: Secondary | ICD-10-CM | POA: Diagnosis not present

## 2017-04-22 DIAGNOSIS — Z09 Encounter for follow-up examination after completed treatment for conditions other than malignant neoplasm: Secondary | ICD-10-CM

## 2017-04-22 DIAGNOSIS — E8770 Fluid overload, unspecified: Secondary | ICD-10-CM | POA: Diagnosis not present

## 2017-04-22 DIAGNOSIS — I252 Old myocardial infarction: Secondary | ICD-10-CM

## 2017-04-22 DIAGNOSIS — N400 Enlarged prostate without lower urinary tract symptoms: Secondary | ICD-10-CM | POA: Diagnosis present

## 2017-04-22 DIAGNOSIS — I5032 Chronic diastolic (congestive) heart failure: Secondary | ICD-10-CM | POA: Diagnosis present

## 2017-04-22 DIAGNOSIS — Z888 Allergy status to other drugs, medicaments and biological substances status: Secondary | ICD-10-CM

## 2017-04-22 HISTORY — DX: Presence of xenogenic heart valve: Z95.3

## 2017-04-22 HISTORY — DX: Other injury of unspecified body region, initial encounter: T14.8XXA

## 2017-04-22 HISTORY — PX: AORTIC VALVE REPLACEMENT: SHX41

## 2017-04-22 HISTORY — PX: TEE WITHOUT CARDIOVERSION: SHX5443

## 2017-04-22 HISTORY — DX: Local infection of the skin and subcutaneous tissue, unspecified: L08.9

## 2017-04-22 LAB — POCT I-STAT, CHEM 8
BUN: 15 mg/dL (ref 6–20)
BUN: 15 mg/dL (ref 6–20)
BUN: 15 mg/dL (ref 6–20)
BUN: 14 mg/dL (ref 6–20)
BUN: 13 mg/dL (ref 6–20)
BUN: 11 mg/dL (ref 6–20)
CALCIUM ION: 1.18 mmol/L (ref 1.15–1.40)
CALCIUM ION: 1.16 mmol/L (ref 1.15–1.40)
CALCIUM ION: 1.13 mmol/L — AB (ref 1.15–1.40)
CALCIUM ION: 1.07 mmol/L — AB (ref 1.15–1.40)
CHLORIDE: 109 mmol/L (ref 101–111)
CHLORIDE: 106 mmol/L (ref 101–111)
CHLORIDE: 106 mmol/L (ref 101–111)
CREATININE: 0.4 mg/dL — AB (ref 0.61–1.24)
CREATININE: 0.5 mg/dL — AB (ref 0.61–1.24)
Calcium, Ion: 1.08 mmol/L — ABNORMAL LOW (ref 1.15–1.40)
Calcium, Ion: 1.11 mmol/L — ABNORMAL LOW (ref 1.15–1.40)
Chloride: 108 mmol/L (ref 101–111)
Chloride: 106 mmol/L (ref 101–111)
Chloride: 105 mmol/L (ref 101–111)
Creatinine, Ser: 0.3 mg/dL — ABNORMAL LOW (ref 0.61–1.24)
Creatinine, Ser: 0.4 mg/dL — ABNORMAL LOW (ref 0.61–1.24)
Creatinine, Ser: 0.4 mg/dL — ABNORMAL LOW (ref 0.61–1.24)
Creatinine, Ser: 0.5 mg/dL — ABNORMAL LOW (ref 0.61–1.24)
GLUCOSE: 93 mg/dL (ref 65–99)
GLUCOSE: 105 mg/dL — AB (ref 65–99)
Glucose, Bld: 101 mg/dL — ABNORMAL HIGH (ref 65–99)
Glucose, Bld: 131 mg/dL — ABNORMAL HIGH (ref 65–99)
Glucose, Bld: 131 mg/dL — ABNORMAL HIGH (ref 65–99)
Glucose, Bld: 114 mg/dL — ABNORMAL HIGH (ref 65–99)
HCT: 34 % — ABNORMAL LOW (ref 39.0–52.0)
HCT: 29 % — ABNORMAL LOW (ref 39.0–52.0)
HCT: 32 % — ABNORMAL LOW (ref 39.0–52.0)
HCT: 30 % — ABNORMAL LOW (ref 39.0–52.0)
HEMATOCRIT: 29 % — AB (ref 39.0–52.0)
HEMATOCRIT: 31 % — AB (ref 39.0–52.0)
HEMOGLOBIN: 11.6 g/dL — AB (ref 13.0–17.0)
HEMOGLOBIN: 10.9 g/dL — AB (ref 13.0–17.0)
HEMOGLOBIN: 10.2 g/dL — AB (ref 13.0–17.0)
HEMOGLOBIN: 10.5 g/dL — AB (ref 13.0–17.0)
Hemoglobin: 9.9 g/dL — ABNORMAL LOW (ref 13.0–17.0)
Hemoglobin: 9.9 g/dL — ABNORMAL LOW (ref 13.0–17.0)
POTASSIUM: 5 mmol/L (ref 3.5–5.1)
Potassium: 3.8 mmol/L (ref 3.5–5.1)
Potassium: 3.6 mmol/L (ref 3.5–5.1)
Potassium: 6.4 mmol/L (ref 3.5–5.1)
Potassium: 4.2 mmol/L (ref 3.5–5.1)
Potassium: 4.1 mmol/L (ref 3.5–5.1)
SODIUM: 143 mmol/L (ref 135–145)
SODIUM: 140 mmol/L (ref 135–145)
SODIUM: 141 mmol/L (ref 135–145)
SODIUM: 140 mmol/L (ref 135–145)
Sodium: 141 mmol/L (ref 135–145)
Sodium: 143 mmol/L (ref 135–145)
TCO2: 27 mmol/L (ref 0–100)
TCO2: 27 mmol/L (ref 0–100)
TCO2: 29 mmol/L (ref 0–100)
TCO2: 26 mmol/L (ref 0–100)
TCO2: 27 mmol/L (ref 0–100)
TCO2: 23 mmol/L (ref 0–100)

## 2017-04-22 LAB — CBC
HCT: 32.8 % — ABNORMAL LOW (ref 39.0–52.0)
HEMATOCRIT: 33.5 % — AB (ref 39.0–52.0)
HEMOGLOBIN: 11.5 g/dL — AB (ref 13.0–17.0)
HEMOGLOBIN: 11.6 g/dL — AB (ref 13.0–17.0)
MCH: 31.5 pg (ref 26.0–34.0)
MCH: 31 pg (ref 26.0–34.0)
MCHC: 35.1 g/dL (ref 30.0–36.0)
MCHC: 34.6 g/dL (ref 30.0–36.0)
MCV: 89.9 fL (ref 78.0–100.0)
MCV: 89.6 fL (ref 78.0–100.0)
PLATELETS: 90 10*3/uL — AB (ref 150–400)
PLATELETS: 90 10*3/uL — AB (ref 150–400)
RBC: 3.65 MIL/uL — AB (ref 4.22–5.81)
RBC: 3.74 MIL/uL — ABNORMAL LOW (ref 4.22–5.81)
RDW: 12.7 % (ref 11.5–15.5)
RDW: 12.6 % (ref 11.5–15.5)
WBC: 6.9 10*3/uL (ref 4.0–10.5)
WBC: 7.9 10*3/uL (ref 4.0–10.5)

## 2017-04-22 LAB — POCT I-STAT 3, ART BLOOD GAS (G3+)
ACID-BASE DEFICIT: 1 mmol/L (ref 0.0–2.0)
ACID-BASE DEFICIT: 1 mmol/L (ref 0.0–2.0)
ACID-BASE DEFICIT: 4 mmol/L — AB (ref 0.0–2.0)
Acid-Base Excess: 2 mmol/L (ref 0.0–2.0)
Acid-Base Excess: 1 mmol/L (ref 0.0–2.0)
Bicarbonate: 27.2 mmol/L (ref 20.0–28.0)
Bicarbonate: 25.2 mmol/L (ref 20.0–28.0)
Bicarbonate: 24.1 mmol/L (ref 20.0–28.0)
Bicarbonate: 21.9 mmol/L (ref 20.0–28.0)
Bicarbonate: 21.3 mmol/L (ref 20.0–28.0)
O2 Saturation: 100 %
O2 Saturation: 100 %
O2 Saturation: 99 %
O2 Saturation: 99 %
O2 Saturation: 98 %
PCO2 ART: 43.4 mmHg (ref 32.0–48.0)
PCO2 ART: 36.1 mmHg (ref 32.0–48.0)
PH ART: 7.405 (ref 7.350–7.450)
PH ART: 7.452 — AB (ref 7.350–7.450)
PH ART: 7.383 (ref 7.350–7.450)
PH ART: 7.352 (ref 7.350–7.450)
PO2 ART: 375 mmHg — AB (ref 83.0–108.0)
PO2 ART: 133 mmHg — AB (ref 83.0–108.0)
PO2 ART: 148 mmHg — AB (ref 83.0–108.0)
PO2 ART: 120 mmHg — AB (ref 83.0–108.0)
TCO2: 29 mmol/L (ref 0–100)
TCO2: 26 mmol/L (ref 0–100)
TCO2: 25 mmol/L (ref 0–100)
TCO2: 23 mmol/L (ref 0–100)
TCO2: 22 mmol/L (ref 0–100)
pCO2 arterial: 40.5 mmHg (ref 32.0–48.0)
pCO2 arterial: 29.5 mmHg — ABNORMAL LOW (ref 32.0–48.0)
pCO2 arterial: 38.7 mmHg (ref 32.0–48.0)
pH, Arterial: 7.48 — ABNORMAL HIGH (ref 7.350–7.450)
pO2, Arterial: 468 mmHg — ABNORMAL HIGH (ref 83.0–108.0)

## 2017-04-22 LAB — MAGNESIUM: Magnesium: 2.6 mg/dL — ABNORMAL HIGH (ref 1.7–2.4)

## 2017-04-22 LAB — HEMOGLOBIN AND HEMATOCRIT, BLOOD
HCT: 32.2 % — ABNORMAL LOW (ref 39.0–52.0)
HEMOGLOBIN: 11.2 g/dL — AB (ref 13.0–17.0)

## 2017-04-22 LAB — GLUCOSE, CAPILLARY
GLUCOSE-CAPILLARY: 83 mg/dL (ref 65–99)
Glucose-Capillary: 93 mg/dL (ref 65–99)
Glucose-Capillary: 84 mg/dL (ref 65–99)
Glucose-Capillary: 111 mg/dL — ABNORMAL HIGH (ref 65–99)

## 2017-04-22 LAB — CREATININE, SERUM
Creatinine, Ser: 0.63 mg/dL (ref 0.61–1.24)
GFR calc Af Amer: >60 mL/min (ref >60)
GFR calc non Af Amer: >60 mL/min (ref >60)

## 2017-04-22 LAB — PLATELET COUNT: Platelets: 99 10*3/uL — ABNORMAL LOW (ref 150–400)

## 2017-04-22 LAB — APTT: APTT: 37 s — AB (ref 24–36)

## 2017-04-22 LAB — PROTIME-INR
INR: 1.47
PROTHROMBIN TIME: 18 s — AB (ref 11.4–15.2)

## 2017-04-22 SURGERY — REPLACEMENT, AORTIC VALVE, OPEN
Anesthesia: General | Site: Chest

## 2017-04-22 MED ORDER — PROTAMINE SULFATE 10 MG/ML IV SOLN
INTRAVENOUS | Status: AC
  Filled 2017-04-22: qty 25

## 2017-04-22 MED ORDER — 0.9 % SODIUM CHLORIDE (POUR BTL) OPTIME
TOPICAL | Status: DC | PRN
  Administered 2017-04-22: 5000 mL

## 2017-04-22 MED ORDER — GELATIN ABSORBABLE MT POWD
OROMUCOSAL | Status: DC | PRN
  Administered 2017-04-22: 09:00:00 via TOPICAL

## 2017-04-22 MED ORDER — ASPIRIN EC 325 MG PO TBEC
325.0000 mg | DELAYED_RELEASE_TABLET | Freq: Every day | ORAL | Status: DC
  Administered 2017-04-23 – 2017-04-28 (×6): 325 mg via ORAL
  Filled 2017-04-22 (×6): qty 1

## 2017-04-22 MED ORDER — LIDOCAINE 2% (20 MG/ML) 5 ML SYRINGE
INTRAMUSCULAR | Status: DC | PRN
  Administered 2017-04-22: 100 mg via INTRAVENOUS

## 2017-04-22 MED ORDER — LACTATED RINGERS IV SOLN: 500.0000 mL | Freq: Once | INTRAVENOUS | Status: DC | PRN

## 2017-04-22 MED ORDER — MIDAZOLAM HCL 10 MG/2ML IJ SOLN
INTRAMUSCULAR | Status: AC
  Filled 2017-04-22: qty 2

## 2017-04-22 MED ORDER — POTASSIUM CHLORIDE 10 MEQ/50ML IV SOLN
10.0000 meq | INTRAVENOUS | Status: AC
  Administered 2017-04-22 (×3): 10 meq via INTRAVENOUS

## 2017-04-22 MED ORDER — SUCCINYLCHOLINE CHLORIDE 200 MG/10ML IV SOSY
PREFILLED_SYRINGE | INTRAVENOUS | Status: AC
  Filled 2017-04-22: qty 10

## 2017-04-22 MED ORDER — METOPROLOL TARTRATE 5 MG/5ML IV SOLN: 2.5000 mg | INTRAVENOUS | Status: DC | PRN

## 2017-04-22 MED ORDER — ARTIFICIAL TEARS OPHTHALMIC OINT
TOPICAL_OINTMENT | OPHTHALMIC | Status: AC
  Filled 2017-04-22: qty 3.5

## 2017-04-22 MED ORDER — SODIUM CHLORIDE 0.9% FLUSH: 3.0000 mL | INTRAVENOUS | Status: DC | PRN

## 2017-04-22 MED ORDER — ORAL CARE MOUTH RINSE: 15.0000 mL | Freq: Two times a day (BID) | OROMUCOSAL | Status: DC

## 2017-04-22 MED ORDER — ONDANSETRON HCL 4 MG/2ML IJ SOLN: 4.0000 mg | Freq: Four times a day (QID) | INTRAMUSCULAR | Status: DC | PRN

## 2017-04-22 MED ORDER — PHENYLEPHRINE HCL 10 MG/ML IJ SOLN
INTRAVENOUS | Status: DC | PRN
  Administered 2017-04-22: 20 ug/min via INTRAVENOUS

## 2017-04-22 MED ORDER — SODIUM CHLORIDE 0.9 % IV SOLN
INTRAVENOUS | Status: DC
  Filled 2017-04-22: qty 1

## 2017-04-22 MED ORDER — HEPARIN SODIUM (PORCINE) 1000 UNIT/ML IJ SOLN
INTRAMUSCULAR | Status: DC | PRN
  Administered 2017-04-22: 20000 [IU] via INTRAVENOUS

## 2017-04-22 MED ORDER — LACTATED RINGERS IV SOLN: INTRAVENOUS | Status: DC

## 2017-04-22 MED ORDER — SODIUM CHLORIDE 0.9 % IV SOLN
INTRAVENOUS | Status: DC | PRN
  Administered 2017-04-22: 11:00:00 via INTRAVENOUS

## 2017-04-22 MED ORDER — MAGNESIUM SULFATE 4 GM/100ML IV SOLN
4.0000 g | Freq: Once | INTRAVENOUS | Status: AC
  Administered 2017-04-22: 4 g via INTRAVENOUS
  Filled 2017-04-22: qty 100

## 2017-04-22 MED ORDER — THROMBIN 20000 UNITS EX SOLR
OROMUCOSAL | Status: DC | PRN
  Administered 2017-04-22: 09:00:00 via TOPICAL

## 2017-04-22 MED ORDER — PROPOFOL 10 MG/ML IV BOLUS
INTRAVENOUS | Status: DC | PRN
  Administered 2017-04-22: 130 mg via INTRAVENOUS

## 2017-04-22 MED ORDER — ALBUMIN HUMAN 5 % IV SOLN
250.0000 mL | INTRAVENOUS | Status: AC | PRN
  Administered 2017-04-23 (×2): 250 mL via INTRAVENOUS
  Filled 2017-04-22: qty 250

## 2017-04-22 MED ORDER — ACETAMINOPHEN 160 MG/5ML PO SOLN: 1000.0000 mg | Freq: Four times a day (QID) | ORAL | Status: DC

## 2017-04-22 MED ORDER — SODIUM CHLORIDE 0.9 % IV SOLN: 250.0000 mL | INTRAVENOUS | Status: DC

## 2017-04-22 MED ORDER — ACETAMINOPHEN 500 MG PO TABS
1000.0000 mg | ORAL_TABLET | Freq: Four times a day (QID) | ORAL | Status: DC
  Administered 2017-04-23 – 2017-04-26 (×10): 1000 mg via ORAL
  Filled 2017-04-22 (×10): qty 2

## 2017-04-22 MED ORDER — ROCURONIUM BROMIDE 10 MG/ML (PF) SYRINGE
PREFILLED_SYRINGE | INTRAVENOUS | Status: AC
  Filled 2017-04-22: qty 5

## 2017-04-22 MED ORDER — LACTATED RINGERS IV SOLN
INTRAVENOUS | Status: DC | PRN
  Administered 2017-04-22: 07:00:00 via INTRAVENOUS

## 2017-04-22 MED ORDER — SODIUM CHLORIDE 0.9 % IV SOLN: Freq: Once | INTRAVENOUS | Status: DC

## 2017-04-22 MED ORDER — CHLORHEXIDINE GLUCONATE 0.12 % MT SOLN
15.0000 mL | OROMUCOSAL | Status: AC
  Administered 2017-04-22: 15 mL via OROMUCOSAL

## 2017-04-22 MED ORDER — METOPROLOL TARTRATE 25 MG/10 ML ORAL SUSPENSION: 12.5000 mg | Freq: Two times a day (BID) | ORAL | Status: DC

## 2017-04-22 MED ORDER — ACETAMINOPHEN 160 MG/5ML PO SOLN: 650.0000 mg | Freq: Once | ORAL | Status: AC

## 2017-04-22 MED ORDER — PANTOPRAZOLE SODIUM 40 MG PO TBEC
40.0000 mg | DELAYED_RELEASE_TABLET | Freq: Every day | ORAL | Status: DC
  Administered 2017-04-24 – 2017-04-28 (×5): 40 mg via ORAL
  Filled 2017-04-22 (×5): qty 1

## 2017-04-22 MED ORDER — EPHEDRINE 5 MG/ML INJ
INTRAVENOUS | Status: AC
  Filled 2017-04-22: qty 10

## 2017-04-22 MED ORDER — BISACODYL 10 MG RE SUPP: 10.0000 mg | Freq: Every day | RECTAL | Status: DC

## 2017-04-22 MED ORDER — TRAMADOL HCL 50 MG PO TABS
50.0000 mg | ORAL_TABLET | ORAL | Status: DC | PRN
  Administered 2017-04-27 – 2017-04-28 (×4): 100 mg via ORAL
  Filled 2017-04-22 (×4): qty 2

## 2017-04-22 MED ORDER — MIDAZOLAM HCL 5 MG/5ML IJ SOLN
INTRAMUSCULAR | Status: DC | PRN
  Administered 2017-04-22: 1 mg via INTRAVENOUS
  Administered 2017-04-22: 3 mg via INTRAVENOUS
  Administered 2017-04-22: 2 mg via INTRAVENOUS
  Administered 2017-04-22: 1 mg via INTRAVENOUS
  Administered 2017-04-22: 3 mg via INTRAVENOUS

## 2017-04-22 MED ORDER — NITROGLYCERIN 0.2 MG/ML ON CALL CATH LAB
INTRAVENOUS | Status: DC | PRN
  Administered 2017-04-22: 20 ug via INTRAVENOUS
  Administered 2017-04-22: 40 ug via INTRAVENOUS
  Administered 2017-04-22: 20 ug via INTRAVENOUS

## 2017-04-22 MED ORDER — CHLORHEXIDINE GLUCONATE 0.12 % MT SOLN
15.0000 mL | Freq: Two times a day (BID) | OROMUCOSAL | Status: DC
  Administered 2017-04-22 – 2017-04-23 (×2): 15 mL via OROMUCOSAL
  Filled 2017-04-22: qty 15

## 2017-04-22 MED ORDER — THROMBIN 20000 UNITS EX SOLR
CUTANEOUS | Status: AC
  Filled 2017-04-22: qty 20000

## 2017-04-22 MED ORDER — SODIUM CHLORIDE 0.9 % IV SOLN
INTRAVENOUS | Status: DC
  Administered 2017-04-22: 12:00:00 via INTRAVENOUS

## 2017-04-22 MED ORDER — INSULIN ASPART 100 UNIT/ML ~~LOC~~ SOLN
0.0000 [IU] | SUBCUTANEOUS | Status: DC
  Administered 2017-04-23: 2 [IU] via SUBCUTANEOUS

## 2017-04-22 MED ORDER — PROTAMINE SULFATE 10 MG/ML IV SOLN
INTRAVENOUS | Status: DC | PRN
  Administered 2017-04-22: 20 mg via INTRAVENOUS
  Administered 2017-04-22 (×3): 40 mg via INTRAVENOUS
  Administered 2017-04-22 (×3): 20 mg via INTRAVENOUS

## 2017-04-22 MED ORDER — THROMBIN 20000 UNITS EX SOLR
CUTANEOUS | Status: DC | PRN
  Administered 2017-04-22: 09:00:00 via TOPICAL

## 2017-04-22 MED ORDER — CHLORHEXIDINE GLUCONATE 4 % EX LIQD: 30.0000 mL | CUTANEOUS | Status: DC

## 2017-04-22 MED ORDER — METOPROLOL TARTRATE 12.5 MG HALF TABLET: 12.5000 mg | ORAL_TABLET | Freq: Two times a day (BID) | ORAL | Status: DC

## 2017-04-22 MED ORDER — SODIUM CHLORIDE 0.9% FLUSH
3.0000 mL | Freq: Two times a day (BID) | INTRAVENOUS | Status: DC
  Administered 2017-04-23 – 2017-04-24 (×4): 3 mL via INTRAVENOUS

## 2017-04-22 MED ORDER — MORPHINE SULFATE (PF) 4 MG/ML IV SOLN: 1.0000 mg | INTRAVENOUS | Status: DC | PRN

## 2017-04-22 MED ORDER — LIDOCAINE 2% (20 MG/ML) 5 ML SYRINGE
INTRAMUSCULAR | Status: AC
  Filled 2017-04-22: qty 5

## 2017-04-22 MED ORDER — MORPHINE SULFATE (PF) 4 MG/ML IV SOLN: 2.0000 mg | INTRAVENOUS | Status: DC | PRN

## 2017-04-22 MED ORDER — DOCUSATE SODIUM 100 MG PO CAPS
200.0000 mg | ORAL_CAPSULE | Freq: Every day | ORAL | Status: DC
  Administered 2017-04-23 – 2017-04-24 (×2): 200 mg via ORAL
  Filled 2017-04-22 (×3): qty 2

## 2017-04-22 MED ORDER — FENTANYL CITRATE (PF) 250 MCG/5ML IJ SOLN
INTRAMUSCULAR | Status: DC | PRN
  Administered 2017-04-22: 100 ug via INTRAVENOUS
  Administered 2017-04-22: 150 ug via INTRAVENOUS
  Administered 2017-04-22: 100 ug via INTRAVENOUS
  Administered 2017-04-22 (×2): 150 ug via INTRAVENOUS
  Administered 2017-04-22: 50 ug via INTRAVENOUS
  Administered 2017-04-22: 100 ug via INTRAVENOUS
  Administered 2017-04-22 (×2): 150 ug via INTRAVENOUS
  Administered 2017-04-22: 50 ug via INTRAVENOUS
  Administered 2017-04-22: 100 ug via INTRAVENOUS

## 2017-04-22 MED ORDER — ASPIRIN 81 MG PO CHEW: 324.0000 mg | CHEWABLE_TABLET | Freq: Every day | ORAL | Status: DC

## 2017-04-22 MED ORDER — CEFUROXIME SODIUM 1.5 G IV SOLR
1.5000 g | Freq: Two times a day (BID) | INTRAVENOUS | Status: AC
  Administered 2017-04-22 – 2017-04-24 (×4): 1.5 g via INTRAVENOUS
  Filled 2017-04-22 (×4): qty 1.5

## 2017-04-22 MED ORDER — PHENYLEPHRINE 40 MCG/ML (10ML) SYRINGE FOR IV PUSH (FOR BLOOD PRESSURE SUPPORT)
PREFILLED_SYRINGE | INTRAVENOUS | Status: AC
  Filled 2017-04-22: qty 10

## 2017-04-22 MED ORDER — SODIUM CHLORIDE 0.45 % IV SOLN
INTRAVENOUS | Status: DC | PRN
  Administered 2017-04-22: 12:00:00 via INTRAVENOUS

## 2017-04-22 MED ORDER — PHENYLEPHRINE HCL 10 MG/ML IJ SOLN
0.0000 ug/min | INTRAMUSCULAR | Status: DC
  Filled 2017-04-22: qty 2

## 2017-04-22 MED ORDER — BISACODYL 5 MG PO TBEC
10.0000 mg | DELAYED_RELEASE_TABLET | Freq: Every day | ORAL | Status: DC
  Administered 2017-04-23 – 2017-04-24 (×2): 10 mg via ORAL
  Filled 2017-04-22 (×3): qty 2

## 2017-04-22 MED ORDER — ROCURONIUM BROMIDE 10 MG/ML (PF) SYRINGE
PREFILLED_SYRINGE | INTRAVENOUS | Status: DC | PRN
  Administered 2017-04-22: 50 mg via INTRAVENOUS
  Administered 2017-04-22: 100 mg via INTRAVENOUS
  Administered 2017-04-22: 50 mg via INTRAVENOUS

## 2017-04-22 MED ORDER — FAMOTIDINE IN NACL 20-0.9 MG/50ML-% IV SOLN
20.0000 mg | Freq: Two times a day (BID) | INTRAVENOUS | Status: DC
  Administered 2017-04-22: 20 mg via INTRAVENOUS

## 2017-04-22 MED ORDER — OXYCODONE HCL 5 MG PO TABS
5.0000 mg | ORAL_TABLET | ORAL | Status: DC | PRN
  Administered 2017-04-22: 5 mg via ORAL
  Administered 2017-04-23: 10 mg via ORAL
  Filled 2017-04-22: qty 1
  Filled 2017-04-22: qty 2

## 2017-04-22 MED ORDER — VANCOMYCIN HCL IN DEXTROSE 1-5 GM/200ML-% IV SOLN
1000.0000 mg | Freq: Once | INTRAVENOUS | Status: AC
  Administered 2017-04-22: 1000 mg via INTRAVENOUS
  Filled 2017-04-22: qty 200

## 2017-04-22 MED ORDER — SODIUM CHLORIDE 0.9 % IV SOLN
0.0000 ug/kg/h | INTRAVENOUS | Status: DC
  Administered 2017-04-22: 0.7 ug/kg/h via INTRAVENOUS
  Filled 2017-04-22 (×2): qty 2

## 2017-04-22 MED ORDER — ACETAMINOPHEN 650 MG RE SUPP
650.0000 mg | Freq: Once | RECTAL | Status: AC
  Administered 2017-04-22: 650 mg via RECTAL

## 2017-04-22 MED ORDER — PROPOFOL 10 MG/ML IV BOLUS
INTRAVENOUS | Status: AC
  Filled 2017-04-22: qty 20

## 2017-04-22 MED ORDER — NITROGLYCERIN IN D5W 200-5 MCG/ML-% IV SOLN: 0.0000 ug/min | INTRAVENOUS | Status: DC

## 2017-04-22 MED ORDER — FENTANYL CITRATE (PF) 250 MCG/5ML IJ SOLN
INTRAMUSCULAR | Status: AC
  Filled 2017-04-22: qty 25

## 2017-04-22 MED ORDER — HEMOSTATIC AGENTS (NO CHARGE) OPTIME
TOPICAL | Status: DC | PRN
  Administered 2017-04-22: 1 via TOPICAL

## 2017-04-22 MED ORDER — INSULIN REGULAR BOLUS VIA INFUSION
0.0000 [IU] | Freq: Three times a day (TID) | INTRAVENOUS | Status: DC
  Filled 2017-04-22: qty 10

## 2017-04-22 MED ORDER — ARTIFICIAL TEARS OPHTHALMIC OINT
TOPICAL_OINTMENT | OPHTHALMIC | Status: DC | PRN
  Administered 2017-04-22: 1 via OPHTHALMIC

## 2017-04-22 MED ORDER — MIDAZOLAM HCL 2 MG/2ML IJ SOLN: 2.0000 mg | INTRAMUSCULAR | Status: DC | PRN

## 2017-04-22 MED ORDER — HEPARIN SODIUM (PORCINE) 1000 UNIT/ML IJ SOLN
INTRAMUSCULAR | Status: AC
  Filled 2017-04-22: qty 1

## 2017-04-22 MED ORDER — ALBUMIN HUMAN 5 % IV SOLN
INTRAVENOUS | Status: DC | PRN
  Administered 2017-04-22: 11:00:00 via INTRAVENOUS

## 2017-04-22 SURGICAL SUPPLY — 72 items
ADAPTER CARDIO PERF ANTE/RETRO (ADAPTER) ×4 IMPLANT
BAG DECANTER FOR FLEXI CONT (MISCELLANEOUS) ×4 IMPLANT
BLADE STERNUM SYSTEM 6 (BLADE) ×4 IMPLANT
BLADE SURG 15 STRL LF DISP TIS (BLADE) ×2 IMPLANT
BLADE SURG 15 STRL SS (BLADE) ×2
CANISTER SUCT 3000ML PPV (MISCELLANEOUS) ×4 IMPLANT
CANNULA GUNDRY RCSP 15FR (MISCELLANEOUS) ×4 IMPLANT
CATH HEART VENT LEFT (CATHETERS) ×2 IMPLANT
CATH ROBINSON RED A/P 18FR (CATHETERS) ×12 IMPLANT
CATH THORACIC 28FR (CATHETERS) ×4 IMPLANT
CATH THORACIC 36FR (CATHETERS) ×4 IMPLANT
CATH THORACIC 36FR RT ANG (CATHETERS) ×4 IMPLANT
CONT SPEC 4OZ CLIKSEAL STRL BL (MISCELLANEOUS) ×4 IMPLANT
COVER MAYO STAND STRL (DRAPES) ×4 IMPLANT
COVER SURGICAL LIGHT HANDLE (MISCELLANEOUS) IMPLANT
CRADLE DONUT ADULT HEAD (MISCELLANEOUS) ×4 IMPLANT
DRAPE SLUSH/WARMER DISC (DRAPES) ×4 IMPLANT
DRSG COVADERM 4X14 (GAUZE/BANDAGES/DRESSINGS) ×4 IMPLANT
ELECT CAUTERY BLADE 6.4 (BLADE) ×4 IMPLANT
ELECT REM PT RETURN 9FT ADLT (ELECTROSURGICAL) ×8
ELECTRODE REM PT RTRN 9FT ADLT (ELECTROSURGICAL) ×4 IMPLANT
FELT TEFLON 1X6 (MISCELLANEOUS) ×8 IMPLANT
GAUZE SPONGE 4X4 12PLY STRL (GAUZE/BANDAGES/DRESSINGS) ×4 IMPLANT
GLOVE BIO SURGEON STRL SZ 6 (GLOVE) IMPLANT
GLOVE BIO SURGEON STRL SZ 6.5 (GLOVE) ×18 IMPLANT
GLOVE BIO SURGEON STRL SZ7 (GLOVE) IMPLANT
GLOVE BIO SURGEON STRL SZ7.5 (GLOVE) IMPLANT
GLOVE BIO SURGEONS STRL SZ 6.5 (GLOVE) ×6
GLOVE BIOGEL PI IND STRL 6.5 (GLOVE) ×10 IMPLANT
GLOVE BIOGEL PI IND STRL 7.0 (GLOVE) ×6 IMPLANT
GLOVE BIOGEL PI INDICATOR 6.5 (GLOVE) ×10
GLOVE BIOGEL PI INDICATOR 7.0 (GLOVE) ×6
GLOVE EUDERMIC 7 POWDERFREE (GLOVE) ×12 IMPLANT
GOWN STRL REUS W/ TWL LRG LVL3 (GOWN DISPOSABLE) ×20 IMPLANT
GOWN STRL REUS W/ TWL XL LVL3 (GOWN DISPOSABLE) ×2 IMPLANT
GOWN STRL REUS W/TWL LRG LVL3 (GOWN DISPOSABLE) ×20
GOWN STRL REUS W/TWL XL LVL3 (GOWN DISPOSABLE) ×2
HEART VENT LT CURVED (MISCELLANEOUS) ×4 IMPLANT
HEMOSTAT POWDER SURGIFOAM 1G (HEMOSTASIS) ×12 IMPLANT
HEMOSTAT SURGICEL 2X14 (HEMOSTASIS) ×4 IMPLANT
KIT BASIN OR (CUSTOM PROCEDURE TRAY) ×4 IMPLANT
KIT CATH CPB BARTLE (MISCELLANEOUS) ×4 IMPLANT
KIT ROOM TURNOVER OR (KITS) ×4 IMPLANT
KIT SUCTION CATH 14FR (SUCTIONS) ×4 IMPLANT
LINE VENT (MISCELLANEOUS) ×4 IMPLANT
NS IRRIG 1000ML POUR BTL (IV SOLUTION) ×24 IMPLANT
PACK OPEN HEART (CUSTOM PROCEDURE TRAY) ×4 IMPLANT
PAD ARMBOARD 7.5X6 YLW CONV (MISCELLANEOUS) ×8 IMPLANT
SET CARDIOPLEGIA MPS 5001102 (MISCELLANEOUS) ×4 IMPLANT
SUT BONE WAX W31G (SUTURE) ×4 IMPLANT
SUT ETHIBON 2 0 V 52N 30 (SUTURE) ×8 IMPLANT
SUT ETHIBOND V-5 VALVE (SUTURE) ×4 IMPLANT
SUT PROLENE 3 0 SH DA (SUTURE) ×4 IMPLANT
SUT PROLENE 3 0 SH1 36 (SUTURE) ×4 IMPLANT
SUT PROLENE 4 0 RB 1 (SUTURE) ×10
SUT PROLENE 4-0 RB1 .5 CRCL 36 (SUTURE) ×10 IMPLANT
SUT STEEL 6MS V (SUTURE) ×8 IMPLANT
SUT VIC AB 1 CTX 36 (SUTURE) ×8
SUT VIC AB 1 CTX36XBRD ANBCTR (SUTURE) ×8 IMPLANT
SUTURE E-PAK OPEN HEART (SUTURE) ×4 IMPLANT
SYSTEM SAHARA CHEST DRAIN ATS (WOUND CARE) ×4 IMPLANT
TAPE CLOTH SURG 4X10 WHT LF (GAUZE/BANDAGES/DRESSINGS) ×4 IMPLANT
TAPE PAPER 2X10 WHT MICROPORE (GAUZE/BANDAGES/DRESSINGS) ×4 IMPLANT
TOWEL GREEN STERILE (TOWEL DISPOSABLE) ×4 IMPLANT
TOWEL GREEN STERILE FF (TOWEL DISPOSABLE) ×8 IMPLANT
TOWEL OR 17X24 6PK STRL BLUE (TOWEL DISPOSABLE) IMPLANT
TOWEL OR 17X26 10 PK STRL BLUE (TOWEL DISPOSABLE) IMPLANT
TRAY FOLEY SILVER 16FR TEMP (SET/KITS/TRAYS/PACK) ×4 IMPLANT
UNDERPAD 30X30 (UNDERPADS AND DIAPERS) ×4 IMPLANT
VALVE MAGNA EASE AORTIC 25MM (Prosthesis & Implant Heart) ×4 IMPLANT
VENT LEFT HEART 12002 (CATHETERS) ×4
WATER STERILE IRR 1000ML POUR (IV SOLUTION) ×8 IMPLANT

## 2017-04-22 NOTE — Transfer of Care (Signed)
Immediate Anesthesia Transfer of Care Note  Patient: Darin Simmons  Procedure(s) Performed: Procedure(s): AORTIC VALVE REPLACEMENT (AVR) using Magna Ease size 80mm (N/A) TRANSESOPHAGEAL ECHOCARDIOGRAM (TEE) (N/A)  Patient Location: ICU  Anesthesia Type:General  Level of Consciousness: sedated, unresponsive and Patient remains intubated per anesthesia plan  Airway & Oxygen Therapy: Patient remains intubated per anesthesia plan and Patient placed on Ventilator (see vital sign flow sheet for setting)  Post-op Assessment: Report given to RN and Post -op Vital signs reviewed and stable  Post vital signs: Reviewed and stable  Last Vitals:  Vitals:   04/22/17 0607  BP: (!) 143/72  Pulse: (!) 53  Resp: 18  Temp: 36.8 C    Last Pain: There were no vitals filed for this visit.    Patients Stated Pain Goal: 3 (88/75/79 [id number])  Complications: No apparent anesthesia complications

## 2017-04-22 NOTE — Anesthesia Procedure Notes (Signed)
Procedure Name: Intubation Date/Time: 04/22/2017 7:49 AM Performed by: Garrison Columbus T Pre-anesthesia Checklist: Patient identified, Emergency Drugs available, Suction available and Patient being monitored Patient Re-evaluated:Patient Re-evaluated prior to inductionOxygen Delivery Method: Circle System Utilized Preoxygenation: Pre-oxygenation with 100% oxygen Intubation Type: IV induction Ventilation: Mask ventilation without difficulty Laryngoscope Size: Miller and 2 Grade View: Grade I Tube type: Oral Tube size: 8.0 mm Number of attempts: 1 Airway Equipment and Method: Stylet and Oral airway Placement Confirmation: ETT inserted through vocal cords under direct vision,  positive ETCO2 and breath sounds checked- equal and bilateral Secured at: 23 cm Tube secured with: Tape Dental Injury: Teeth and Oropharynx as per pre-operative assessment

## 2017-04-22 NOTE — Progress Notes (Signed)
[facility] BRIEF SICU PROGRESS NOTE  Day of Surgery  S/P Procedure(s) (LRB): AORTIC VALVE REPLACEMENT (AVR) using Magna Ease size 56mm (N/A) TRANSESOPHAGEAL ECHOCARDIOGRAM (TEE) (N/A)   Extubated uneventfully AV paced w/ stable hemodynamics Breathing comfortably w/ O2 sats 99-100% Chest tube output low UOP > 100 mL/hr Labs okay  Plan: Continue routine early postop  Rexene Alberts, MD 04/22/2017 9:10 PM

## 2017-04-22 NOTE — Brief Op Note (Signed)
04/22/2017  10:39 AM  PATIENT:  Darin Simmons  76 y.o. male  PRE-OPERATIVE DIAGNOSIS:  SEVERE AS  POST-OPERATIVE DIAGNOSIS:  SEVERE AS  PROCEDURE:  Procedure(s): AORTIC VALVE REPLACEMENT (AVR) using Magna Ease size 68mm (N/A) TRANSESOPHAGEAL ECHOCARDIOGRAM (TEE) (N/A)  SURGEON:  Surgeon(s) and Role:    * Bartle, Fernande Boyden, MD - Primary  PHYSICIAN ASSISTANT: WAYNE GOLD PA-C  ANESTHESIA:   general  EBL:  Total I/O In: -  Out: 600 [Urine:600]  BLOOD ADMINISTERED:1 PLTS  DRAINS:ROUTINE  LOCAL MEDICATIONS USED:  NONE  SPECIMEN:  ROUTINE AORTIC VALVE LEAFLETS  DISPOSITION OF SPECIMEN:  PATHOLOGY  COUNTS:  YES  TOURNIQUET:  * No tourniquets in log *  DICTATION: .Dragon Dictation  PLAN OF CARE: Admit to inpatient   PATIENT DISPOSITION:  ICU - intubated and hemodynamically stable.   Delay start of Pharmacological VTE agent (>24hrs) due to surgical blood loss or risk of bleeding: yes  COMPLICATIONS: NO KNOWN

## 2017-04-22 NOTE — Progress Notes (Signed)
  Echocardiogram 2D Echocardiogram has been performed.  Darin Simmons 04/22/2017, 8:16 AM

## 2017-04-22 NOTE — H&P (Signed)
[address]       [place] [address]             [phone]       Cardiothoracic Surgery Admission History and Physical   Cardiothoracic Surgery Consultation  PCP is Darcus Austin, MD Referring Provider is Jettie Booze, MD      Chief Complaint  Patient presents with  . Aortic Stenosis    Surgical eval, ECHO 03/18/17    HPI:   The patient is a 76 year old gentleman with hypercholesterolemia, coronary artery disease s/p DES to the RCA, and aortic stenosis. His most recent echo on 03/18/2017 showed a trileaflet aortic valve with severely calcified leaflets and restricted mobility. There was severe stenosis with a mean gradient of 43 mm Hg and a peak of 80 mm Hg. The LVEF was 55-60%. The patient reports having some exertional shortness of breath and fatigue. He had an episode recently of headache and blurred vision and felt like he was going to pass out. It resolved after several minutes. He has not had that happen again. He has not had any chest pain.   He was on Plavix for a while after his stent but stopped taking it and now takes a systemic enzyme which he says acts as an anticoagulant. He is vegetarian and intermittently fasts for 12-14 hrs per day and sometimes more than 24 hrs at a time.        Past Medical History:  Diagnosis Date  . 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 Aortic stenosis   . BPH (benign prostatic hyperplasia)   . CAD (coronary artery disease) 2004  . Heart attack ([place]) 2004  . Heart murmur   . Hypercholesterolemia   . 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 PVD (peripheral vascular disease) ([place])          Past Surgical History:  Procedure Laterality Date  . DENTAL SURGERY     removed  . hair follicle removed Left   . heart stent    . HERNIA REPAIR Bilateral 08/05/77   BIH and umbilical hernia repair  . INGUINAL HERNIA REPAIR Bilateral 01/18/2013   Procedure: LAPAROSCOPIC  BILATERAL INGUINAL HERNIA REPAIR;  Surgeon: Odis Hollingshead, MD;  Location: [facility];  Service: General;  Laterality: Bilateral;  . INSERTION OF MESH Bilateral 01/18/2013   Procedure: INSERTION OF MESH;  Surgeon: Odis Hollingshead, MD;  Location: [facility];  Service: General;  Laterality: Bilateral;  AND UMBILICUS  . UMBILICAL HERNIA REPAIR N/A 01/18/2013   Procedure: HERNIA REPAIR UMBILICAL ADULT;  Surgeon: Odis Hollingshead, MD;  Location: [facility];  Service: General;  Laterality: N/A;         Family History  Problem Relation Age of Onset  . Heart disease Father     Social History        Social History  Substance Use Topics  . Smoking status: Former Smoker    Packs/day: 1.00    Years: 50.00    Types: Cigarettes    Quit date: 11/22/2008  . Smokeless tobacco: Never Used  . Alcohol use Yes      Comment: occasional          Current Outpatient Prescriptions  Medication Sig Dispense Refill  . aspirin EC 81 MG tablet Take 81 mg by mouth 2 (two) times daily.    . cholecalciferol (VITAMIN D) 400 UNITS TABS Take 400 Units by mouth daily.    Marland Kitchen  folic acid (FOLVITE) 854 MCG tablet Take 800 mcg by mouth daily.    Marland Kitchen GLUCOSAMINE SULFATE PO Take 2,000 mg by mouth daily.    . IODINE, KELP, PO Take by mouth.    . Magnesium 400 MG CAPS Take 400 mg by mouth daily.     . Melatonin 5 MG TABS Take 5 mg by mouth daily.    . nitroGLYCERIN (NITROSTAT) 0.4 MG SL tablet Place 0.4 mg under the tongue every 5 (five) minutes as needed for chest pain.     . potassium gluconate 595 MG TABS Take 595 mg by mouth every evening.    . pravastatin (PRAVACHOL) 20 MG tablet Take 20 mg by mouth daily.    . Selenium 200 MCG CAPS Take 200 mcg by mouth.    . vitamin C (ASCORBIC ACID) 500 MG tablet Take 500 mg by mouth 2 (two) times daily.     . vitamin E 100 UNIT capsule Take 1,000 Units by mouth once a week.    . zinc gluconate 50 MG tablet Take 50 mg by mouth daily.     No  current facility-administered medications for this visit.          Allergies  Allergen Reactions  . Diphenhydramine Hcl Other (See Comments)    Shaky legs  . Bee Venom   . Crestor [Rosuvastatin]     MUSCLE PAIN  . Statins     MUSCLE PAIN    Review of Systems  Constitutional: Positive for activity change and fatigue. Negative for appetite change, chills, fever and unexpected weight change.  HENT: Negative.        Saw his dentist 03/2017  Eyes: Negative.   Respiratory: Negative.   Cardiovascular: Negative for chest pain, palpitations and leg swelling.       Shortness of breath with moderate exertion  Cramps in calves with walking  Varicose veins.  Gastrointestinal: Negative.   Endocrine: Negative.   Genitourinary: Positive for frequency.       BPH  Musculoskeletal: Negative.   Skin: Negative.   Allergic/Immunologic: Negative.   Neurological: Negative.   Hematological: Negative.   Psychiatric/Behavioral: Negative.     BP 123/75   Pulse 60   Resp 20   Ht 5\' 10"  (1.778 m)   Wt 143 lb (64.9 kg)   SpO2 98% Comment: RA  BMI 20.52 kg/m  Physical Exam  Constitutional: He is oriented to person, place, and time. He appears well-developed and well-nourished. No distress.  HENT:  Head: Normocephalic and atraumatic.  Mouth/Throat: Oropharynx is clear and moist.  Teeth in fair condition  Eyes: Conjunctivae and EOM are normal. Pupils are equal, round, and reactive to light.  Neck: Normal range of motion. Neck supple. No JVD present. No thyromegaly present.  Cardiovascular: Normal rate and regular rhythm.   Murmur heard. III/VI systolic murmur RSB  Pulmonary/Chest: Effort normal and breath sounds normal. No respiratory distress.  Abdominal: Soft. Bowel sounds are normal. He exhibits no distension and no mass. There is no tenderness.  Musculoskeletal: Normal range of motion. He exhibits no edema.  Lymphadenopathy:    He has no cervical adenopathy.    Neurological: He is alert and oriented to person, place, and time. He has normal strength. No sensory deficit.  Skin: Skin is warm and dry.  Psychiatric: He has a normal mood and affect.     Diagnostic Tests:  [facility] [address], [place] [address] [phone]  ------------------------------------------------------------------- Transthoracic Echocardiography  Patient:  Aloysious, Vangieson MR #: [id number] Study Date: 03/18/2017 Gender: M Age: 56 Height: 177.8 cm Weight: 64.9 kg BSA: 1.78 m^2 Pt. Status: Room:  SONOGRAPHER [place], [place], Outpatient  cc:  ------------------------------------------------------------------- LV EF: 55% - 60%  ------------------------------------------------------------------- Indications: Aortic stenosis - I35.0.  ------------------------------------------------------------------- History: PMH: Murmur. Coronary artery disease. PMH: Myocardial infarction. Risk factors: Dyslipidemia.  ------------------------------------------------------------------- Study Conclusions  - Left ventricle: The cavity size was normal. Wall thickness was increased in a pattern of moderate LVH. Systolic function was normal. The estimated ejection fraction was in the range of 55% to 60%. Wall motion was normal; there were no regional wall motion abnormalities. Doppler parameters are consistent with abnormal left ventricular relaxation (grade 1 diastolic dysfunction). - Aortic valve: Valve mobility was restricted. There was severe stenosis. There was mild regurgitation. Valve area (VTI): 0.65 cm^2. Valve area (Vmax): 0.63 cm^2. Valve area (Vmean):  0.61 cm^2.  Impressions:  - Normal LV systolic function; milld diastolic dysfuncton; moderate LVH; calcified aortic valve with severe AS (mean gradient 43 mmHg); mild AI.  ------------------------------------------------------------------- Study data: Comparison was made to the study of 01/28/2012. Study status: Routine. Procedure: The patient reported no pain pre or post test. Transthoracic echocardiography. Image quality was adequate. Study completion: There were no complications. Transthoracic echocardiography. M-mode, complete 2D, spectral Doppler, and color Doppler. Birthdate: Patient birthdate: Aug 07, 1941. Age: Patient is 76 yr old. Sex: Gender: male. BMI: 20.5 kg/m^2. Blood pressure: 126/80 Patient status: Outpatient. Study date: Study date: 03/18/2017. Study time: 09:18 AM. Location: [facility]  -------------------------------------------------------------------  ------------------------------------------------------------------- Left ventricle: The cavity size was normal. Wall thickness was increased in a pattern of moderate LVH. Systolic function was normal. The estimated ejection fraction was in the range of 55% to 60%. Wall motion was normal; there were no regional wall motion abnormalities. Doppler parameters are consistent with abnormal left ventricular relaxation (grade 1 diastolic dysfunction).  ------------------------------------------------------------------- Aortic valve: Trileaflet; severely calcified leaflets. Valve mobility was restricted. Doppler: There was severe stenosis. There was mild regurgitation. VTI ratio of LVOT to aortic valve: 0.19. Valve area (VTI): 0.65 cm^2. Indexed valve area (VTI): 0.36 cm^2/m^2. Peak velocity ratio of LVOT to aortic valve: 0.18. Valve area (Vmax): 0.63 cm^2. Indexed valve area (Vmax): 0.35 cm^2/m^2. Mean velocity ratio of LVOT to aortic valve: 0.18. Valve area (Vmean):  0.61 cm^2. Indexed valve area (Vmean): 0.34 cm^2/m^2. Mean gradient (S): 43 mm Hg. Peak gradient (S): 80 mm Hg.  ------------------------------------------------------------------- Aorta: Aortic root: The aortic root was normal in size.  ------------------------------------------------------------------- Mitral valve: Structurally normal valve. Mobility was not restricted. Doppler: Transvalvular velocity was within the normal range. There was no evidence for stenosis. There was trivial regurgitation. Indexed valve area by pressure half-time: 0.92 cm^2/m^2.  ------------------------------------------------------------------- Left atrium: The atrium was normal in size.  ------------------------------------------------------------------- Right ventricle: The cavity size was normal. Systolic function was normal.  ------------------------------------------------------------------- Pulmonic valve: Doppler: Transvalvular velocity was within the normal range. There was no evidence for stenosis.  ------------------------------------------------------------------- Tricuspid valve: Structurally normal valve. Doppler: Transvalvular velocity was within the normal range. There was trivial regurgitation.  ------------------------------------------------------------------- Right atrium: The atrium was normal in size.  ------------------------------------------------------------------- Pericardium: There was no pericardial effusion.  ------------------------------------------------------------------- Systemic veins: Inferior vena cava: The vessel was normal in size.  ------------------------------------------------------------------- Measurements  Left ventricle Value Reference LV ID, ED, PLAX chordal 45 mm 43 - 52 LV ID, ES, PLAX chordal 36 mm 23 - 38 LV fx  shortening, PLAX chordal (L) 20 % >=29 LV PW thickness, ED 12 mm ---------- IVS/LV PW ratio, ED 1.08 <=1.3 Stroke volume, 2D 76 ml ---------- Stroke volume/bsa, 2D 43 ml/m^2 ---------- LV e&', lateral 6.64 cm/s ---------- LV E/e&', lateral 7.89 ---------- LV e&', medial 4.9 cm/s ---------- LV E/e&', medial 10.69 ---------- LV e&', average 5.77 cm/s ---------- LV E/e&', average 9.08 ----------  Ventricular septum Value Reference IVS thickness, ED 13 mm ----------  LVOT Value Reference LVOT ID, S 21 mm ---------- LVOT area 3.46 cm^2 ---------- LVOT peak velocity, S 81.3 cm/s ---------- LVOT mean velocity, S 53.6 cm/s ---------- LVOT VTI, S 22 cm ----------  Aortic valve Value Reference Aortic valve peak velocity, S 446 cm/s ---------- Aortic valve mean velocity, S 303 cm/s ---------- Aortic valve VTI, S 117 cm ---------- Aortic mean gradient, S 43 mm Hg ---------- Aortic peak gradient, S 80 mm Hg ---------- VTI ratio, LVOT/AV 0.19 ---------- Aortic valve area, VTI 0.65 cm^2 ---------- Aortic valve area/bsa, VTI 0.36 cm^2/m^2 ---------- Velocity ratio, peak,  LVOT/AV 0.18 ---------- Aortic valve area, peak velocity 0.63 cm^2 ---------- Aortic valve area/bsa, peak 0.35 cm^2/m^2 ---------- velocity Velocity ratio, mean, LVOT/AV 0.18 ---------- Aortic valve area, mean velocity 0.61 cm^2 ---------- Aortic valve area/bsa, mean 0.34 cm^2/m^2 ---------- velocity Aortic regurg peak velocity 276 cm/s ---------- Aortic regurg pressure half-time 695 ms ---------- Aortic regurg peak gradient 30 mm Hg ----------  Aorta Value Reference Aortic root ID, ED 31 mm ----------  Left atrium Value Reference LA ID, A-P, ES 29 mm ---------- LA ID/bsa, A-P 1.62 cm/m^2 <=2.2 LA volume, S 54 ml ---------- LA volume/bsa, S 30.3 ml/m^2 ---------- LA volume, ES, 1-p A4C 37.4 ml ---------- LA volume/bsa, ES, 1-p A4C 21 ml/m^2 ---------- LA volume, ES, 1-p A2C 63.3 ml ---------- LA volume/bsa, ES, 1-p A2C 35.5 ml/m^2 ----------  Mitral valve Value Reference Mitral E-wave peak velocity 52.4 cm/s ---------- Mitral A-wave peak velocity 83.9 cm/s ---------- Mitral deceleration time (H) 521 ms 150 - 230 Mitral pressure half-time 153 ms ---------- Mitral E/A ratio, peak 0.6 ---------- Mitral valve area/bsa, PHT, DP 0.92 cm^2/m^2 ----------  Right atrium Value Reference RA ID, S-I, ES,  A4C (H) 54 mm 34 - 49 RA area, ES, A4C 15.6 cm^2 8.3 - 19.5 RA volume, ES, A/L 37.3 ml ---------- RA volume/bsa, ES, A/L 20.9 ml/m^2 ----------  Right ventricle Value Reference RV s&', lateral, S 6.96 cm/s ----------  Legend: (L) and (H) mark values outside specified reference range.  ------------------------------------------------------------------- Prepared and Electronically Authenticated by  Kirk Ruths 2018-04-27T10:37:53   Neomia Dear  Cardiac catheterization  Order# [id number]  Reading physician: Jettie Booze, MD Ordering physician: Jettie Booze, MD Study date: 04/11/17  Physicians   Panel Physicians Referring Physician Case Authorizing Physician  Jettie Booze, MD (Primary)    Procedures   Right Heart Cath and Coronary Angiography  Conclusion     Patent RCA stents.  Mild to moderate left sided disease.  Aortic valve was not crossed.  Normal right heart pressures. PA sat 76%. CO 5.3 L/min; CI 2.9.   Continue with plans for aortic valve repair.   Indications   Aortic valve stenosis, etiology of cardiac valve disease unspecified [I35.0 (ICD-10-CM)]  Procedural Details/Technique   Technical Details The risks, benefits, and details of the procedure were explained to the patient. The patient verbalized understanding and wanted to proceed. Informed written consent was obtained.  PROCEDURE TECHNIQUE: After Xylocaine anesthesia, a 5 French sheath was placed in the right  brachial area in exchange for a peripheral IV. A 5 French balloontipped Swan-Ganz catheter was advanced to the pulmonary artery under fluoroscopic guidance, with a coronary wire to guide the catheter. Hemodynamic pressures were obtained. Oxygen saturations were obtained. After Xylocaine  anesthesia, a 38F sheath was placed in the right radial artery with a single anterior needle wall stick. Left coronary angiography was done using a Judkins L3.5 guide catheter. Right coronary angiography was done using a Judkins R4 guide catheter. Left heart cath was not done due to known severe aortic stenosis.     Contrast: 50   Estimated blood loss <50 mL.  During this procedure the patient was administered the following to achieve and maintain moderate conscious sedation: Versed 2 mg, Fentanyl 25 mcg, while the patient's heart rate, blood pressure, and oxygen saturation were continuously monitored. The period of conscious sedation was 34 minutes, of which I was present face-to-face 100% of this time.    Complications   Complications documented before study signed (04/11/2017 2:15 PM EDT)    No complications were associated with this study.  Documented by Jettie Booze, MD - 04/11/2017 2:06 PM EDT    Coronary Findings   Dominance: Right  Left Anterior Descending  Ost LAD to Prox LAD lesion, 25% stenosed.  Mid LAD to Dist LAD lesion, 25% stenosed.  Left Circumflex  Prox Cx to Mid Cx lesion, 50% stenosed.  Right Coronary Artery  Mid RCA lesion, 20% stenosed.  Mid RCA to Dist RCA lesion, 0% stenosed. Previously placed Mid RCA to Dist RCA drug eluting stent is widely patent.  Right Heart   Right Heart Pressures Normal right heart pressures.    Left Heart   Aortic Valve The aortic valve is calcified.    Coronary Diagrams   Diagnostic Diagram       Implants     No implant documentation for this case.  PACS Images   Show images for Cardiac catheterization   Link to Procedure Log   Procedure Log    Hemo Data    Most Recent Value  Fick Cardiac Output 5.37 L/min  Fick Cardiac Output Index 2.98 (L/min)/BSA  RA A Wave 6 mmHg  RA V Wave 4 mmHg  RA Mean 2 mmHg  RV Systolic Pressure 24 mmHg  RV Diastolic Pressure 0 mmHg  RV EDP 6 mmHg  PA Systolic Pressure  25 mmHg  PA Diastolic Pressure 7 mmHg  PA Mean 13 mmHg  PW A Wave 10 mmHg  PW V Wave 9 mmHg  PW Mean 6 mmHg  AO Systolic Pressure 751 mmHg  AO Diastolic Pressure 50 mmHg  AO Mean 69 mmHg  QP/QS 1  TPVR Index 4.36 HRUI  TSVR Index 23.13 HRUI  PVR SVR Ratio 0.1  TPVR/TSVR Ratio 0.19    CLINICAL DATA:  Aortic valve stenosis.  Preop.  Evaluate aorta.  EXAM: CT ANGIOGRAPHY CHEST WITH CONTRAST  TECHNIQUE: Multidetector CT imaging of the chest was performed using the standard protocol during bolus administration of intravenous contrast. Multiplanar CT image reconstructions and MIPs were obtained to evaluate the vascular anatomy.  CONTRAST:  75of Isovue 370  Creatinine was obtained on site at [facility] at [address]  Results: Creatinine 0.7 mg/dL.  COMPARISON:  Plain films of 01/18/2013.  Chest CT 02/12/2010.  FINDINGS: Cardiovascular: Mild great vessel atherosclerosis. Mild aortic atherosclerosis. No significant aortic dilatation. Aorta measures on the order of 2.7 cm at the sino-tubular junction and 3.4 cm in the mid ascending  segment. See coronal series. No dissection.  Mild cardiomegaly, accentuated by a pectus excavatum deformity. Multivessel coronary artery atherosclerosis. Aortic valve calcifications. No central pulmonary embolism, on this non-dedicated study.  Mediastinum/Nodes: No mediastinal or hilar adenopathy.  Lungs/Pleura: No pleural fluid. Secretions in the dependent trachea.  Bulla on the right lower lobe.  Upper Abdomen: High left hepatic lobe tiny lesion is likely a cyst. Normal imaged portions of the spleen, stomach, pancreas, adrenal glands. Too small to characterize lesions in both kidneys. Accessory upper pole right renal artery.  Musculoskeletal: Degenerate changes of both glenohumeral joints.  Review of the MIP images confirms the above findings.  IMPRESSION: 1. Aortic atherosclerosis, without aneurysm or  dissection. 2. Coronary artery atherosclerosis. 3. Aortic valve calcifications, consistent with the clinical history of aortic valve stenosis.   Electronically Signed   By: Abigail Miyamoto M.D.   On: 04/06/2017 09:41   Impression:  This 76 year old gentleman as stage D severe, symptomatic aortic stenosis with NYHA class II symptoms of exertional fatigue and shortness of breath consistent with chronic diastolic heart failure. In addition he recently had an episode of marked dizziness and blurred vision and near syncope. I have personally reviewed his echo and there is a trileaflet aortic valve with severely calcified leaflets and restricted mobility with severe stenosis and a mean gradient of 43 mm Hg. I agree that AVR is indicated in this patient. He is at low risk for open surgical AVR and is not a TAVR candidate. Cardiac cath shows patent stents in the RCA and mild non-obstructive disease in the LAD and LCX.  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  I would plan to use a bioprosthetic valve given his age. I discussed the operative procedure with the patient  including alternatives, benefits and risks; including but not limited to bleeding, blood transfusion, infection, stroke, myocardial infarction, heart block requiring a permanent pacemaker, organ dysfunction, and death.  Neomia Dear understands and agrees to proceed.    Plan:  AVR using a bioprosthetic valve   Gaye Pollack, MD [facility] [phone]

## 2017-04-22 NOTE — Interval H&P Note (Signed)
History and Physical Interval Note:  04/22/2017 5:53 AM  Darin Simmons  has presented today for surgery, with the diagnosis of SEVERE AS  The various methods of treatment have been discussed with the patient and family. After consideration of risks, benefits and other options for treatment, the patient has consented to  Procedure(s): AORTIC VALVE REPLACEMENT (AVR) (N/A) TRANSESOPHAGEAL ECHOCARDIOGRAM (TEE) (N/A) as a surgical intervention .  The patient's history has been reviewed, patient examined, no change in status, stable for surgery.  I have reviewed the patient's chart and labs.  Questions were answered to the patient's satisfaction.     Gaye Pollack

## 2017-04-22 NOTE — Anesthesia Procedure Notes (Signed)
Central Venous Catheter Insertion Performed by: Roderic Palau, anesthesiologist Start/End6/11/2016 6:35 AM, 04/22/2017 6:45 AM Patient location: Pre-op. Preanesthetic checklist: patient identified, IV checked, site marked, risks and benefits discussed, surgical consent, monitors and equipment checked, pre-op evaluation, timeout performed and anesthesia consent Position: Trendelenburg Lidocaine 1% used for infiltration and patient sedated Hand hygiene performed , maximum sterile barriers used  and Seldinger technique used Catheter size: 8.5 Fr Total catheter length 10. Central line and PA cath was placed.Sheath introducer Swan type:thermodilution PA Cath depth:50 Procedure performed using ultrasound guided technique. Ultrasound Notes:anatomy identified, needle tip was noted to be adjacent to the nerve/plexus identified, no ultrasound evidence of intravascular and/or intraneural injection and image(s) printed for medical record Attempts: 1 Following insertion, line sutured and dressing applied. Post procedure assessment: blood return through all ports, free fluid flow and no air  Patient tolerated the procedure well with no immediate complications.

## 2017-04-22 NOTE — Progress Notes (Signed)
Initial Istat Ec4 Lab not crossing over into the chart.    Labs were as follows  Na: 144 K: 3.6 Glu:84 Hct:29 Hgb:9.9  Potassium on the machine resulted as 3.6 and was acted upon per protocol with 30 meq of IV potassium.   Darrel Reach, RN

## 2017-04-22 NOTE — Procedures (Signed)
Extubation Procedure Note  Patient Details:   Name: DEMERE DOTZLER DOB: [birth year] MRN: [id number]   Airway Documentation:  Airway 8 mm (Active)  Secured at (cm) 23 cm 04/22/2017 12:00 PM  Measured From Lips 04/22/2017 12:00 PM  Secured Location Right 04/22/2017 12:00 PM  Secured By Pink Tape 04/22/2017 12:00 PM    Evaluation  O2 sats: stable throughout Complications: No apparent complications Patient did tolerate procedure well. Bilateral Breath Sounds: Clear, Diminished   Yes  Patient had a NIF of -25 and a VC of 1.3 pre extubation. Post extubation patient oriented to time and place. Patient with good effort had 1000 ml when using the Incentive Spirometer times four.  [place], [place] 04/22/2017, 4:15 PM

## 2017-04-22 NOTE — Op Note (Signed)
CARDIOVASCULAR SURGERY OPERATIVE NOTE  04/22/2017 Neomia Dear [id number]  Surgeon:  Gaye Pollack, MD  First Assistant: Jadene Pierini,  PA-C   Preoperative Diagnosis:  Severe aortic stenosis   Postoperative Diagnosis:  Same   Procedure:  1. Median Sternotomy 2. Extracorporeal circulation 3.   Aortic valve replacement using a 25 mm Edwards Magna-Ease pericardial valve.  Anesthesia:  General Endotracheal   Clinical History/Surgical Indication:  The patient is a 76 year old gentleman with hypercholesterolemia, coronary artery disease s/p DES to the RCA, and aortic stenosis. His most recent echo on 03/18/2017 showed a trileaflet aortic valve with severely calcified leaflets and restricted mobility. There was severe stenosis with a mean gradient of 43 mm Hg and a peak of 80 mm Hg. The LVEF was 55-60%. The patient reports having some exertional shortness of breath and fatigue. He had an episode recently of headache and blurred vision and felt like he was going to pass out. It resolved after several minutes. He has not had that happen again. He has not had any chest pain.   He has stage D severe, symptomatic aortic stenosis with NYHA class II symptoms of exertional fatigue and shortness of breath consistent with chronic diastolic heart failure. In addition he recently had an episode of marked dizziness and blurred vision and near syncope. I have personally reviewed his echo and there is a trileaflet aortic valve with severely calcified leaflets and restricted mobility with severe stenosis and a mean gradient of 43 mm Hg. I agree that AVR is indicated in this patient. He is at low risk for open surgical AVR and is not a TAVR candidate. Cardiac cath shows patent stents in the RCA and mild non-obstructive disease in the LAD and LCX.  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  I  would plan to use a bioprosthetic valve given his age. I discussed the operative procedure with the patient including alternatives, benefits and risks; including but not limited to bleeding, blood transfusion, infection, stroke, myocardial infarction, heart block requiring a permanent pacemaker, organ dysfunction, and death. Michai P Robinsunderstands and agrees to proceed.    Preparation:  The patient was seen in the preoperative holding area and the correct patient, correct operation were confirmed with the patient after reviewing the medical record and catheterization. The consent was signed by me. Preoperative antibiotics were given. A pulmonary arterial line and radial arterial line were placed by the anesthesia team. The patient was taken back to the operating room and positioned supine on the operating room table. After being placed under general endotracheal anesthesia by the anesthesia team a foley catheter was placed. The neck, chest, abdomen, and both legs were prepped with betadine soap and solution and draped in the usual sterile manner. A surgical time-out was taken and the correct patient and operative procedure were confirmed with the nursing and anesthesia staff.   Pre-bypass TEE:   Complete TEE assessment was performed by Dr. Tobias Alexander.    Conclusions   Result status: Final result   Left ventricle: Normal cavity size. Concentric hypertrophy of severe severity. LV systolic function is normal with an EF of 55-60%. There are no obvious wall motion abnormalities. No thrombus present. No mass present.  Aortic valve: The valve is probable trileaflet. Severe valve thickening present. Severe valve calcification present. Severe stenosis. Moderate regurgitation.  Mitral valve: Mild  leaflet thickening is present. Mild leaflet calcification is present. Mild mitral annular calcification. Mild regurgitation.         Post-bypass TEE:   Post-Op TEE AORTA Aorta unchanged from  pre-bypass.  LEFT VENTRICLE Left ventricle unchanged from pre-bypass.  RIGHT VENTRICLE Right ventricle unchanged from pre-bypass.  AORTIC VALVE A bioprosthetic valve was placed. Leaflet is freely mobile. No paravalvular leak and The transaortic gradient is normal post replacement.  MITRAL VALVE Mitral valve unchanged from pre-bypass.     Cardiopulmonary Bypass:  A median sternotomy was performed. The pericardium was opened in the midline. Right ventricular function appeared normal. The ascending aorta was of normal size and had no palpable plaque. There were no contraindications to aortic cannulation or cross-clamping. The patient was fully systemically heparinized and the ACT was maintained > 400 sec. The proximal aortic arch was cannulated with a 20 F aortic cannula for arterial inflow. Venous cannulation was performed via the right atrial appendage using a two-staged venous cannula. An antegrade cardioplegia/vent cannula was inserted into the mid-ascending aorta. A left ventricular vent was placed via the right superior pulmonary vein. A retrograde cardioplegia cannnula was placed into the coronary sinus via the right atrium. Aortic occlusion was performed with a single cross-clamp. Systemic cooling to 32 degrees Centigrade and topical cooling of the heart with iced saline were used. Hyperkalemic antegrade cold blood cardioplegia was used to induce diastolic arrest and then cold blood retrograde cardioplegia was given at about 20 minute intervals throughout the period of arrest to maintain myocardial temperature at or below 10 degrees centigrade. A temperature probe was inserted into the interventricular septum and an insulating pad was placed in the pericardium. Carbon dioxide was insufflated into the pericardium at 5L/min throughout the procedure to minimize intracardiac air.   Aortic Valve Replacement:   A transverse aortotomy was performed 1 cm above the take-off of the right coronary  artery. 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 The ostia of the coronary arteries were in normal position and were not obstructed. The native valve leaflets were excised and the annulus was decalcified with rongeurs. Care was taken to remove all particulate debris. The left ventricle was directly inspected for debris and then irrigated with ice saline solution. The annulus was sized and a size 25 mm  Edwards Magna-Ease pericardial valve was chosen. The model number was [id number] and the serial number was [id number]. While the valve was being prepared 2-0 Ethibond pledgeted horizontal mattress sutures were placed around the annulus with the pledgets in a sub-annular position. The sutures were placed through the sewing ring and the valve lowered into place. The sutures were tied sequentially. The valve seated nicely and the coronary ostia were not obstructed. The prosthetic valve leaflets moved normally and there was no sub-valvular obstruction. The aortotomy was closed using 4-0 Prolene suture in 2 layers with felt strips to reinforce the closure.  Completion:  The patient was rewarmed to 37 degrees Centigrade. De-airing maneuvers were performed and the head placed in trendelenburg position. The crossclamp was removed with a time of 88 minutes. There was spontaneous return of sinus rhythm. The aortotomy was checked for hemostasis. Two temporary epicardial pacing wires were placed on the right atrium and two on the right ventricle. The left ventricular vent and retrograde cardioplegia cannulas were removed. The patient was weaned from CPB without difficulty on no inotropes. CPB time was 113 minutes. Cardiac output was  5 LPM. Heparin was fully reversed with protamine and the aortic and venous cannulas removed. Hemostasis was achieved. Mediastinal and right pleural drainage tubes were placed. The sternum was  closed with  #6 stainless steel wires. The fascia was closed with continuous # 1 vicryl suture. The subcutaneous tissue was closed with 2-0 vicryl continuous suture. The skin was closed with 3-0 vicryl subcuticular suture. All sponge, needle, and instrument counts were reported correct at the end of the case. Dry sterile dressings were placed over the incisions and around the chest tubes which were connected to pleurevac suction. The patient was then transported to the surgical intensive care unit in critical but stable condition.

## 2017-04-22 NOTE — Anesthesia Postprocedure Evaluation (Signed)
Anesthesia Post Note  Patient: Darin Simmons  Procedure(s) Performed: Procedure(s) (LRB): AORTIC VALVE REPLACEMENT (AVR) using Magna Ease size 39mm (N/A) TRANSESOPHAGEAL ECHOCARDIOGRAM (TEE) (N/A)     Patient location during evaluation: SICU Anesthesia Type: General Level of consciousness: sedated Pain management: pain level controlled Vital Signs Assessment: post-procedure vital signs reviewed and stable Respiratory status: patient remains intubated per anesthesia plan Cardiovascular status: stable Anesthetic complications: no    Last Vitals:  Vitals:   04/22/17 1300 04/22/17 1315  BP:    Pulse: 80 88  Resp: 12 13  Temp: (!) 35.7 C (!) 35.7 C    Last Pain:  Vitals:   04/22/17 1300  TempSrc: Core (Comment)                 Darin Simmons DANIEL

## 2017-04-23 ENCOUNTER — Inpatient Hospital Stay (HOSPITAL_COMMUNITY): Payer: Medicare Other

## 2017-04-23 LAB — CBC
HCT: 33.1 % — ABNORMAL LOW (ref 39.0–52.0)
HEMATOCRIT: 33.5 % — AB (ref 39.0–52.0)
Hemoglobin: 11.4 g/dL — ABNORMAL LOW (ref 13.0–17.0)
Hemoglobin: 11.4 g/dL — ABNORMAL LOW (ref 13.0–17.0)
MCH: 31.1 pg (ref 26.0–34.0)
MCH: 30.9 pg (ref 26.0–34.0)
MCHC: 34.4 g/dL (ref 30.0–36.0)
MCHC: 34 g/dL (ref 30.0–36.0)
MCV: 90.2 fL (ref 78.0–100.0)
MCV: 90.8 fL (ref 78.0–100.0)
Platelets: 101 10*3/uL — ABNORMAL LOW (ref 150–400)
Platelets: 106 10*3/uL — ABNORMAL LOW (ref 150–400)
RBC: 3.67 MIL/uL — ABNORMAL LOW (ref 4.22–5.81)
RBC: 3.69 MIL/uL — ABNORMAL LOW (ref 4.22–5.81)
RDW: 12.6 % (ref 11.5–15.5)
RDW: 12.7 % (ref 11.5–15.5)
WBC: 8.8 10*3/uL (ref 4.0–10.5)
WBC: 9.6 10*3/uL (ref 4.0–10.5)

## 2017-04-23 LAB — BASIC METABOLIC PANEL
ANION GAP: 7 (ref 5–15)
BUN: 9 mg/dL (ref 6–20)
CALCIUM: 7.5 mg/dL — AB (ref 8.9–10.3)
CO2: 23 mmol/L (ref 22–32)
Chloride: 106 mmol/L (ref 101–111)
Creatinine, Ser: 0.54 mg/dL — ABNORMAL LOW (ref 0.61–1.24)
GFR calc non Af Amer: >60 mL/min (ref >60)
Glucose, Bld: 104 mg/dL — ABNORMAL HIGH (ref 65–99)
Potassium: 3.5 mmol/L (ref 3.5–5.1)
SODIUM: 136 mmol/L (ref 135–145)

## 2017-04-23 LAB — PREPARE PLATELET PHERESIS: Unit division: 0

## 2017-04-23 LAB — POCT I-STAT, CHEM 8
BUN: 12 mg/dL (ref 6–20)
CALCIUM ION: 1.18 mmol/L (ref 1.15–1.40)
CREATININE: 0.6 mg/dL — AB (ref 0.61–1.24)
Chloride: 100 mmol/L — ABNORMAL LOW (ref 101–111)
Glucose, Bld: 106 mg/dL — ABNORMAL HIGH (ref 65–99)
HEMATOCRIT: 31 % — AB (ref 39.0–52.0)
HEMOGLOBIN: 10.5 g/dL — AB (ref 13.0–17.0)
Potassium: 4 mmol/L (ref 3.5–5.1)
Sodium: 137 mmol/L (ref 135–145)
TCO2: 26 mmol/L (ref 0–100)

## 2017-04-23 LAB — BPAM PLATELET PHERESIS
Blood Product Expiration Date: 201806042359
ISSUE DATE / TIME: 201806011021
UNIT TYPE AND RH: 6200

## 2017-04-23 LAB — CREATININE, SERUM
CREATININE: 0.67 mg/dL (ref 0.61–1.24)
GFR calc Af Amer: >60 mL/min (ref >60)
GFR calc non Af Amer: >60 mL/min (ref >60)

## 2017-04-23 LAB — GLUCOSE, CAPILLARY
GLUCOSE-CAPILLARY: 107 mg/dL — AB (ref 65–99)
GLUCOSE-CAPILLARY: 110 mg/dL — AB (ref 65–99)
GLUCOSE-CAPILLARY: 113 mg/dL — AB (ref 65–99)
GLUCOSE-CAPILLARY: 120 mg/dL — AB (ref 65–99)
GLUCOSE-CAPILLARY: 96 mg/dL (ref 65–99)
GLUCOSE-CAPILLARY: 98 mg/dL (ref 65–99)
Glucose-Capillary: 147 mg/dL — ABNORMAL HIGH (ref 65–99)
Glucose-Capillary: 82 mg/dL (ref 65–99)

## 2017-04-23 LAB — MAGNESIUM
MAGNESIUM: 2 mg/dL (ref 1.7–2.4)
Magnesium: 2.1 mg/dL (ref 1.7–2.4)

## 2017-04-23 MED ORDER — POTASSIUM CHLORIDE 10 MEQ/50ML IV SOLN
10.0000 meq | INTRAVENOUS | Status: DC
  Administered 2017-04-23 (×3): 10 meq via INTRAVENOUS
  Filled 2017-04-23 (×2): qty 50

## 2017-04-23 MED ORDER — ENOXAPARIN SODIUM 30 MG/0.3ML ~~LOC~~ SOLN
30.0000 mg | Freq: Every day | SUBCUTANEOUS | Status: DC
  Administered 2017-04-24 – 2017-04-26 (×3): 30 mg via SUBCUTANEOUS
  Filled 2017-04-23 (×3): qty 0.3

## 2017-04-23 MED ORDER — POTASSIUM CHLORIDE 10 MEQ/50ML IV SOLN
INTRAVENOUS | Status: AC
  Filled 2017-04-23: qty 50

## 2017-04-23 NOTE — Plan of Care (Signed)
Problem: Activity: Goal: Risk for activity intolerance will decrease Outcome: Progressing Pt to be up oob in am  Problem: Bowel/Gastric: Goal: Gastrointestinal status for postoperative course will improve Outcome: Progressing Tolerating clears  Problem: Cardiac: Goal: Hemodynamic stability will improve Outcome: Progressing Within parameters on no gtts Goal: Ability to maintain an adequate cardiac output will improve Outcome: Progressing Within parameters

## 2017-04-23 NOTE — Progress Notes (Signed)
[address]       [place] [address]             [phone]        CARDIOTHORACIC SURGERY PROGRESS NOTE   R1 Day Post-Op Procedure(s) (LRB): AORTIC VALVE REPLACEMENT (AVR) using Magna Ease size 37mm (N/A) TRANSESOPHAGEAL ECHOCARDIOGRAM (TEE) (N/A)  Subjective: Feels well.  Mild soreness in chest.  No SOB  Objective: Vital signs: BP Readings from Last 1 Encounters:  04/23/17 114/69   Pulse Readings from Last 1 Encounters:  04/23/17 89   Resp Readings from Last 1 Encounters:  04/23/17 13   Temp Readings from Last 1 Encounters:  04/23/17 97.7 F (36.5 C)    Hemodynamics: PAP: (12-27)/(0-19) 21/10 CO:  [3.1 L/min-5.6 L/min] 4.2 L/min CI:  [1.7 L/min/m2-3.1 L/min/m2] 2.3 L/min/m2  Physical Exam:  Rhythm:   Sinus brady - AAI paced  Breath sounds: clear  Heart sounds:  RRR  Incisions:  Dressings dry, intact  Abdomen:  Soft, non-distended, non-tender  Extremities:  Warm, well-perfused  Chest tubes:  low volume thin serosanguinous output, no air leak    Intake/Output from previous day: 06/01 0701 - 06/02 0700 In: 5345 [P.O.:75; I.V.:3503; Blood:617; IV Piggyback:1150] Out: 5275 [RXVQM:0867; Emesis/NG output:50; Blood:950; Chest Tube:480] Intake/Output this shift: Total I/O In: 156.5 [I.V.:106.5; IV Piggyback:50] Out: 350 [Urine:350]  Lab Results:  CBC: Recent Labs  04/22/17 1810 04/22/17 1816 04/23/17 0500  WBC 7.9  --  8.8  HGB 11.6* 10.5* 11.4*  HCT 33.5* 31.0* 33.1*  PLT 90*  --  101*    BMET:  Recent Labs  04/20/17 1404  04/22/17 1816 04/23/17 0500  NA 138  < > 140 136  K 3.6  < > 4.1 3.5  CL 108  < > 105 106  CO2 21*  --   --  23  GLUCOSE 94  < > 114* 104*  BUN 9  < > 11 9  CREATININE 0.61  < > 0.50* 0.54*  CALCIUM 8.8*  --   --  7.5*  < > = values in this interval not displayed.   PT/INR:   Recent Labs  04/22/17 1215  LABPROT 18.0*  INR 1.47    CBG (last 3)   Recent Labs  04/23/17 0006  04/23/17 0415 04/23/17 0801  GLUCAP 110* 113* 98    ABG    Component Value Date/Time   PHART 7.352 04/22/2017 1723   PCO2ART 38.7 04/22/2017 1723   PO2ART 120.0 (H) 04/22/2017 1723   HCO3 21.3 04/22/2017 1723   TCO2 23 04/22/2017 1816   ACIDBASEDEF 4.0 (H) 04/22/2017 1723   O2SAT 98.0 04/22/2017 1723    CXR: PORTABLE CHEST 1 VIEW  COMPARISON:  04/22/2017 and prior radiograph  FINDINGS: An endotracheal tube and NG tube have been removed.  Aortic valve replacement, right IJ Swan-Ganz catheter with tip overlying the main pulmonary artery, mediastinal and right thoracostomy tubes again noted.  There is no evidence of pneumothorax.  No airspace disease or pleural effusion.  IMPRESSION: Endotracheal tube and NG tube removal without other change. No evidence of pneumothorax.   Electronically Signed   By: Margarette Canada M.D.   On: 04/23/2017 07:03   EKG: Sinus brady w/out acute ischemic changes    Assessment/Plan: S/P Procedure(s) (LRB): AORTIC VALVE REPLACEMENT (AVR) using Magna Ease size 19mm (N/A) TRANSESOPHAGEAL ECHOCARDIOGRAM (TEE) (N/A)  Doing well POD1 Mobilize D/C tubes and lines Pulm toilet Routine care  Rexene Alberts, MD 04/23/2017  9:07 AM

## 2017-04-24 ENCOUNTER — Inpatient Hospital Stay (HOSPITAL_COMMUNITY): Payer: Medicare Other

## 2017-04-24 LAB — CBC
HCT: 33.2 % — ABNORMAL LOW (ref 39.0–52.0)
Hemoglobin: 11.3 g/dL — ABNORMAL LOW (ref 13.0–17.0)
MCH: 31 pg (ref 26.0–34.0)
MCHC: 34 g/dL (ref 30.0–36.0)
MCV: 91.2 fL (ref 78.0–100.0)
Platelets: 84 10*3/uL — ABNORMAL LOW (ref 150–400)
RBC: 3.64 MIL/uL — AB (ref 4.22–5.81)
RDW: 12.7 % (ref 11.5–15.5)
WBC: 7.8 10*3/uL (ref 4.0–10.5)

## 2017-04-24 LAB — BASIC METABOLIC PANEL
Anion gap: 5 (ref 5–15)
BUN: 11 mg/dL (ref 6–20)
CALCIUM: 8 mg/dL — AB (ref 8.9–10.3)
CHLORIDE: 105 mmol/L (ref 101–111)
CO2: 25 mmol/L (ref 22–32)
CREATININE: 0.5 mg/dL — AB (ref 0.61–1.24)
Glucose, Bld: 102 mg/dL — ABNORMAL HIGH (ref 65–99)
Potassium: 3.7 mmol/L (ref 3.5–5.1)
SODIUM: 135 mmol/L (ref 135–145)

## 2017-04-24 LAB — GLUCOSE, CAPILLARY
GLUCOSE-CAPILLARY: 116 mg/dL — AB (ref 65–99)
GLUCOSE-CAPILLARY: 84 mg/dL (ref 65–99)

## 2017-04-24 MED ORDER — FUROSEMIDE 40 MG PO TABS
40.0000 mg | ORAL_TABLET | Freq: Every day | ORAL | Status: AC
  Administered 2017-04-24 – 2017-04-25 (×2): 40 mg via ORAL
  Filled 2017-04-24 (×2): qty 1

## 2017-04-24 MED ORDER — SODIUM CHLORIDE 0.9% FLUSH
3.0000 mL | Freq: Two times a day (BID) | INTRAVENOUS | Status: DC
  Administered 2017-04-24 – 2017-04-26 (×3): 3 mL via INTRAVENOUS

## 2017-04-24 MED ORDER — MOVING RIGHT ALONG BOOK
Freq: Once | Status: AC
  Administered 2017-04-24: 10:00:00
  Filled 2017-04-24: qty 1

## 2017-04-24 MED ORDER — POTASSIUM CHLORIDE CRYS ER 20 MEQ PO TBCR
20.0000 meq | EXTENDED_RELEASE_TABLET | Freq: Every day | ORAL | Status: AC
  Administered 2017-04-24 – 2017-04-25 (×2): 20 meq via ORAL
  Filled 2017-04-24 (×2): qty 1

## 2017-04-24 MED ORDER — SODIUM CHLORIDE 0.9 % IV SOLN
250.0000 mL | INTRAVENOUS | Status: DC | PRN
Start: 2017-04-24 — End: 2017-04-27

## 2017-04-24 MED ORDER — SODIUM CHLORIDE 0.9% FLUSH: 3.0000 mL | INTRAVENOUS | Status: DC | PRN

## 2017-04-24 NOTE — Progress Notes (Signed)
Patient admitted to 2W05 A&O x4, VSS. Tele applied and CCMD notified. Patient oriented to room and call light placed within reach. Denies any needs at this time.

## 2017-04-24 NOTE — Progress Notes (Signed)
[address]       ,[place] [address]             [phone]        CARDIOTHORACIC SURGERY PROGRESS NOTE   R2 Days Post-Op Procedure(s) (LRB): AORTIC VALVE REPLACEMENT (AVR) using Magna Ease size 95mm (N/A) TRANSESOPHAGEAL ECHOCARDIOGRAM (TEE) (N/A)  Subjective: Doing well.  Minimal pain.  Looks good  Objective: Vital signs: BP Readings from Last 1 Encounters:  04/24/17 120/64   Pulse Readings from Last 1 Encounters:  04/24/17 75   Resp Readings from Last 1 Encounters:  04/24/17 17   Temp Readings from Last 1 Encounters:  04/24/17 98.3 F (36.8 C) (Oral)    Hemodynamics: PAP: (29)/(15) 29/15  Physical Exam:  Rhythm:   sinus  Breath sounds: clear  Heart sounds:  RRR  Incisions:  Dressings dry, intact  Abdomen:  Soft, non-distended, non-tender  Extremities:  Warm, well-perfused    Intake/Output from previous day: 06/02 0701 - 06/03 0700 In: 1457.5 [P.O.:720; I.V.:587.5; IV Piggyback:150] Out: 2111 [Urine:2110; Stool:1] Intake/Output this shift: No intake/output data recorded.  Lab Results:  CBC: Recent Labs  04/23/17 1708 04/23/17 1716 04/24/17 0630  WBC 9.6  --  7.8  HGB 11.4* 10.5* 11.3*  HCT 33.5* 31.0* 33.2*  PLT 106*  --  84*    BMET:  Recent Labs  04/23/17 0500  04/23/17 1716 04/24/17 0630  NA 136  --  137 135  K 3.5  --  4.0 3.7  CL 106  --  100* 105  CO2 23  --   --  25  GLUCOSE 104*  --  106* 102*  BUN 9  --  12 11  CREATININE 0.54*  < > 0.60* 0.50*  CALCIUM 7.5*  --   --  8.0*  < > = values in this interval not displayed.   PT/INR:   Recent Labs  04/22/17 1215  LABPROT 18.0*  INR 1.47    CBG (last 3)   Recent Labs  04/23/17 2016 04/23/17 2349 04/24/17 0342  GLUCAP 147* 82 116*    ABG    Component Value Date/Time   PHART 7.352 04/22/2017 1723   PCO2ART 38.7 04/22/2017 1723   PO2ART 120.0 (H) 04/22/2017 1723   HCO3 21.3 04/22/2017 1723   TCO2 26 04/23/2017 1716   ACIDBASEDEF 4.0  (H) 04/22/2017 1723   O2SAT 98.0 04/22/2017 1723    CXR: PORTABLE CHEST 1 VIEW  COMPARISON:  04/23/2017 and prior radiographs  FINDINGS: Cardiomegaly and aortic valve replacement noted.  Swan-Ganz catheter and mediastinal and right thoracostomy tubes have been removed.  A right IJ central venous catheter sheath remains.  Left basilar atelectasis and subsegmental atelectasis within the right mid lung are noted.  There is no evidence of pneumothorax.  IMPRESSION: Support apparatus removal as described with mild right mid lung and left basilar atelectasis.   Electronically Signed   By: Margarette Canada M.D.   On: 04/24/2017 07:56  Assessment/Plan: S/P Procedure(s) (LRB): AORTIC VALVE REPLACEMENT (AVR) using Magna Ease size 39mm (N/A) TRANSESOPHAGEAL ECHOCARDIOGRAM (TEE) (N/A)  Doing well POD2 Maintaining NSR w/ stable BP Breathing comfortably w/ O2 sats 92-97% on RA Expected post op acute blood loss anemia, very mild Expected post op volume excess, mild, weight reportedly only 2 lbs > preop Post op thrombocytopenia, mild, platelet count 84k this morning   Mobilize  D/C foley and central line  Continue routine postop  Transfer 2W   Valentina Gu  Roxy Manns, MD 04/24/2017 9:09 AM

## 2017-04-25 ENCOUNTER — Inpatient Hospital Stay (HOSPITAL_COMMUNITY): Payer: Medicare Other

## 2017-04-25 ENCOUNTER — Encounter (HOSPITAL_COMMUNITY): Payer: Self-pay | Admitting: Surgery

## 2017-04-25 LAB — BASIC METABOLIC PANEL
Anion gap: 6 (ref 5–15)
BUN: 11 mg/dL (ref 6–20)
CHLORIDE: 107 mmol/L (ref 101–111)
CO2: 26 mmol/L (ref 22–32)
CREATININE: 0.63 mg/dL (ref 0.61–1.24)
Calcium: 8.2 mg/dL — ABNORMAL LOW (ref 8.9–10.3)
GFR calc Af Amer: >60 mL/min (ref >60)
GFR calc non Af Amer: >60 mL/min (ref >60)
Glucose, Bld: 93 mg/dL (ref 65–99)
Potassium: 3.7 mmol/L (ref 3.5–5.1)
Sodium: 139 mmol/L (ref 135–145)

## 2017-04-25 LAB — POCT I-STAT 4, (NA,K, GLUC, HGB,HCT)
GLUCOSE: 84 mg/dL (ref 65–99)
HEMATOCRIT: 29 % — AB (ref 39.0–52.0)
HEMOGLOBIN: 9.9 g/dL — AB (ref 13.0–17.0)
Potassium: 3.6 mmol/L (ref 3.5–5.1)
Sodium: 144 mmol/L (ref 135–145)

## 2017-04-25 LAB — CBC
HCT: 32.1 % — ABNORMAL LOW (ref 39.0–52.0)
HEMOGLOBIN: 10.7 g/dL — AB (ref 13.0–17.0)
MCH: 30.7 pg (ref 26.0–34.0)
MCHC: 33.3 g/dL (ref 30.0–36.0)
MCV: 92 fL (ref 78.0–100.0)
Platelets: 84 10*3/uL — ABNORMAL LOW (ref 150–400)
RBC: 3.49 MIL/uL — ABNORMAL LOW (ref 4.22–5.81)
RDW: 13.2 % (ref 11.5–15.5)
WBC: 8 10*3/uL (ref 4.0–10.5)

## 2017-04-25 LAB — GLUCOSE, CAPILLARY: Glucose-Capillary: 84 mg/dL (ref 65–99)

## 2017-04-25 MED ORDER — POTASSIUM CHLORIDE CRYS ER 20 MEQ PO TBCR
30.0000 meq | EXTENDED_RELEASE_TABLET | Freq: Once | ORAL | Status: AC
  Administered 2017-04-25: 30 meq via ORAL
  Filled 2017-04-25: qty 1

## 2017-04-25 MED ORDER — ALPRAZOLAM 0.25 MG PO TABS
0.2500 mg | ORAL_TABLET | Freq: Two times a day (BID) | ORAL | Status: DC | PRN
  Administered 2017-04-26 – 2017-04-27 (×2): 0.25 mg via ORAL
  Filled 2017-04-25 (×2): qty 1

## 2017-04-25 MED FILL — Lidocaine HCl IV Inj 20 MG/ML: INTRAVENOUS | Qty: 10 | Status: AC

## 2017-04-25 MED FILL — Mannitol IV Soln 20%: INTRAVENOUS | Qty: 500 | Status: AC

## 2017-04-25 MED FILL — Heparin Sodium (Porcine) Inj 1000 Unit/ML: INTRAMUSCULAR | Qty: 30 | Status: AC

## 2017-04-25 MED FILL — Sodium Chloride IV Soln 0.9%: INTRAVENOUS | Qty: 2000 | Status: AC

## 2017-04-25 MED FILL — Electrolyte-R (PH 7.4) Solution: INTRAVENOUS | Qty: 3000 | Status: AC

## 2017-04-25 MED FILL — Sodium Bicarbonate IV Soln 8.4%: INTRAVENOUS | Qty: 50 | Status: AC

## 2017-04-25 NOTE — Discharge Summary (Signed)
Physician Discharge Summary       [address]       [place] [address]             [phone]    Patient ID: Darin Simmons MRN: [id number] DOB/AGE: 01-06-1941 76 y.o.  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 Tobacco abuse 7. Mild ABL anemia 8. Thrombocytopenia 9. Phlebitis right forearm  Procedure (s):  1. Median Sternotomy 2. Extracorporeal circulation 3.   Aortic valve replacement using a 25 mm Edwards Magna-Ease peri cardial valve by Dr Cyndia Bent on 04/22/2017.  History of Presenting Illness: The patient is a 76 year old gentleman with hypercholesterolemia, coronary artery disease s/p DES to the RCA, and aortic stenosis. His most recent echo on 03/18/2017 showed a trileaflet aortic valve with severely calcified leaflets and restricted mobility. There was severe stenosis with a mean gradient of 43 mm Hg and a peak of 80 mm Hg. The LVEF was 55-60%. The patient reports having some exertional shortness of breath and fatigue. He had an episode recently of headache and blurred vision and felt like he was going to pass out. It resolved after several minutes. He has not had that happen again. He has not had any chest pain.   He was on Plavix for a while after his stent but stopped taking it and now takes a systemic enzyme which he says acts as an anticoagulant. He is vegetarian and intermittently fasts for 12-14 hrs per day and sometimes more than 24 hrs at a time.   This 76 year old gentleman as stage D severe, symptomatic aortic stenosis with NYHA class II symptoms of exertional fatigue and shortness of breath consistent with chronic diastolic heart failure. In addition, he recently had an episode of marked  dizziness and blurred vision and near syncope. Dr. Cyndia Bent personally reviewed his echo and there is a trileaflet aortic valve with severely calcified leaflets and restricted mobility with severe stenosis and a mean gradient of 43 mm Hg. He agreed that AVR is indicated in this patient. He is at low risk for open surgical AVR and is not a TAVR candidate. Cardiac cath shows patent stents in the RCA and mild non-obstructive disease in the LAD and LCX.  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  Dr. Cyndia Bent planned to use a bioprosthetic valve given his age. He discussed the operative procedure with the patient including alternatives, benefits and risks. Patient agreed to proceed with surgery. Pre operative carotid duplex US showed no significant internal carotid artery stenosis bilaterally. Patient underwent an AVR on 04/22/2017.  Brief Hospital Course:  The patient was extubated the aternoon of surgery without difficulty. He remained afebrile and hemodynamically stable. He was initially AAI paced. Gordy Councilman, a line, and chest tubes, were removed early in the post operative course.Foley and central line were removed on 06/03. Lopressor was not started as his blood pressure was labile, although bradycardia did resolve. He was volume over loaded and diuresed. He had ABL anemia. He did not require a post op transfusion. Last H and H was stable at 9.8 and 29.2. He also had thrombocytopenia. He was on Lovenox initially post op  but this was later stopped. He is only on ecasa 325 mg daily. His last platelet count was increased to 75,000. He was weaned off the insulin drip.  The patient's HGA1C pre op was 4.9 so accu checks and SS were stopped on transfer from the ICU to the floor. The patient was felt surgically stable for transfer from the ICU to PCTU for further convalescence on 04/24/2017. He continues  to progress with cardiac rehab. He was ambulating on room air. He has been tolerating a diet and has had a bowel movement. Epicardial pacing wires were removed on 04/25/2017. He appeared to have phlebitis of right forearm from IV. The area had erythema and firmness. He was put on Keflex on 06/05 and this will be continued after discharge for a total of one week. Chest tube sutures will be removed in the office after discharge. The patient is felt surgically stable for discharge today.   Latest Vital Signs: Blood pressure 117/67, pulse 72, temperature 99.8 F (37.7 C), temperature source Oral, resp. rate 18, height 5\' 10"  (1.778 m), weight 66.5 kg (146 lb 11.2 oz), SpO2 97 %.  Physical Exam: General appearance: alert and cooperative Heart: regular rate and rhythm, S1, S2 normal, no murmur, click, rub or gallop Lungs: clear to auscultation bilaterally Extremities: no edema. Right antecubital phlebitis improving. Wound: incision ok  Discharge Condition: Stable and discharged to home.  Recent laboratory studies:  Lab Results  Component Value Date   WBC 8.2 04/28/2017   HGB 9.8 (L) 04/28/2017   HCT 29.2 (L) 04/28/2017   MCV 92.7 04/28/2017   PLT 75 (L) 04/28/2017   Lab Results  Component Value Date   NA 139 04/25/2017   K 3.7 04/25/2017   CL 107 04/25/2017   CO2 26 04/25/2017   CREATININE 0.63 04/25/2017   GLUCOSE 93 04/25/2017   Diagnostic Studies:  ------------------------------------------------------------------- Transthoracic Echocardiography  Patient:    Darin Simmons, Darin Simmons MR #:       [id number] Study Date: 04/27/2017 Gender:     M Age:        41 Height:     177.8 cm Weight:     65.5 kg BSA:        1.79 m^2 Pt. Status: Room:       2W05C   ADMITTING  Gilford Raid, MD  ATTENDING  Gilford Raid, MD  Livia Snellen   Gilford Raid, MD  cc:  ------------------------------------------------------------------- LV EF:  55%  ------------------------------------------------------------------- Study Conclusions  - Left ventricle: The cavity size was normal. Wall thickness was   increased in a pattern of mild LVH. Indeterminant diastolic   function. The estimated ejection fraction was 55%. Wall motion   was normal; there were no regional wall motion abnormalities. - Aortic valve: Bioprosthetic aortic valve. There was trivial   regurgitation. No significant bioprosthetic valve stenosis. Mean   gradient (S): 13 mm Hg. Valve area (VTI): 1.89 cm^2. - Right ventricle: The cavity size was normal. Systolic function   was mildly reduced. - Right atrium: The atrium was mildly dilated. - Pulmonary arteries: No complete TR doppler jet so unable to   estimate PA systolic pressure. - Systemic veins: IVC not visualized.  Impressions:  - Normal LV size with mild LV hypertrophy. EF 55%. Normal RV size   with mildly decreased systolic function. Bioprosthetic aortic   valve with mean gradient 13 and trivial AI.  ------------------------------------------------------------------- Study data:   Study status:  Routine.  Procedure:  Transthoracic echocardiography. Image quality was adequate.  Transthoracic echocardiography.  M-mode, complete 2D, spectral Doppler, and color Doppler.  Birthdate:  Patient birthdate: 01/31/1941.  Age:  Patient is 76 yr old.  Sex:  Gender: male. BMI: 20.7 kg/m^2.  Patient status:  Inpatient.  Study date:  Study date: 04/27/2017. Study time: 04:32 PM.  -------------------------------------------------------------------  ------------------------------------------------------------------- Left ventricle:  The cavity size was normal. Wall thickness was increased in a pattern of mild LVH. Indeterminant diastolic function. The estimated ejection fraction was 55%. Wall motion was normal; there were no regional wall motion  abnormalities.  ------------------------------------------------------------------- Aortic valve:  Bioprosthetic aortic valve.  Doppler:  There was trivial regurgitation. No significant bioprosthetic valve stenosis.    VTI ratio of LVOT to aortic valve: 0.6. Valve area (VTI): 1.89 cm^2. Indexed valve area (VTI): 1.05 cm^2/m^2. Peak velocity ratio of LVOT to aortic valve: 0.6. Valve area (Vmax): 1.89 cm^2. Indexed valve area (Vmax): 1.05 cm^2/m^2. Mean velocity ratio of LVOT to aortic valve: 0.58. Valve area (Vmean): 1.82 cm^2. Indexed valve area (Vmean): 1.02 cm^2/m^2.    Mean gradient (S): 13 mm Hg. Peak gradient (S): 26 mm Hg.  ------------------------------------------------------------------- Aorta:  Aortic root: The aortic root was normal in size. Ascending aorta: The ascending aorta was normal in size.  ------------------------------------------------------------------- Mitral valve:   Normal thickness leaflets .  Doppler:   There was no evidence for stenosis.      Valve area by pressure half-time: 2.75 cm^2. Indexed valve area by pressure half-time: 1.53 cm^2/m^2.    Peak gradient (D): 4 mm Hg.  ------------------------------------------------------------------- Left atrium:  The atrium was normal in size.  ------------------------------------------------------------------- Right ventricle:  The cavity size was normal. Systolic function was mildly reduced.  ------------------------------------------------------------------- Pulmonic valve:    Structurally normal valve.   Cusp separation was normal.  Doppler:  Transvalvular velocity was within the normal range. There was no regurgitation.  ------------------------------------------------------------------- Tricuspid valve:   Doppler:  There was no significant regurgitation.  ------------------------------------------------------------------- Pulmonary artery:   No complete TR doppler jet so unable to estimate PA  systolic pressure.  ------------------------------------------------------------------- Right atrium:  The atrium was mildly dilated.  ------------------------------------------------------------------- Pericardium:  There was no pericardial effusion.  ------------------------------------------------------------------- Systemic veins:  IVC not visualized.  ------------------------------------------------------------------- Measurements   Left ventricle                            Value          Reference  LV ID, ED, PLAX chordal                   48.7  mm       43 - 52  LV ID, ES, PLAX chordal                   34.4  mm       23 - 38  LV fx shortening, PLAX chordal            29    %        >=29  LV PW thickness, ED                       12.6  mm       ---------  IVS/LV PW ratio, ED                       1.01           <=1.3  Stroke  volume, 2D                         78    ml       ---------  Stroke volume/bsa, 2D                     43    ml/m^2   ---------  LV e&', lateral                            9.03  cm/s     ---------  LV E/e&', lateral                          10.74          ---------  LV e&', medial                             8.27  cm/s     ---------  LV E/e&', medial                           11.73          ---------  LV e&', average                            8.65  cm/s     ---------  LV E/e&', average                          11.21          ---------    Ventricular septum                        Value          Reference  IVS thickness, ED                         12.7  mm       ---------    LVOT                                      Value          Reference  LVOT ID, S                                20    mm       ---------  LVOT area                                 3.14  cm^2     ---------  LVOT peak velocity, S                     153   cm/s     ---------  LVOT mean velocity, S                     95.7  cm/s     ---------  LVOT VTI, S  24.8  cm       ---------  LVOT peak gradient, S                     9     mm Hg    ---------    Aortic valve                              Value          Reference  Aortic valve peak velocity, S             254   cm/s     ---------  Aortic valve mean velocity, S             165   cm/s     ---------  Aortic valve VTI, S                       41.3  cm       ---------  Aortic mean gradient, S                   13    mm Hg    ---------  Aortic peak gradient, S                   26    mm Hg    ---------  VTI ratio, LVOT/AV                        0.6            ---------  Aortic valve area, VTI                    1.89  cm^2     ---------  Aortic valve area/bsa, VTI                1.05  cm^2/m^2 ---------  Velocity ratio, peak, LVOT/AV             0.6            ---------  Aortic valve area, peak velocity          1.89  cm^2     ---------  Aortic valve area/bsa, peak               1.05  cm^2/m^2 ---------  velocity  Velocity ratio, mean, LVOT/AV             0.58           ---------  Aortic valve area, mean velocity          1.82  cm^2     ---------  Aortic valve area/bsa, mean               1.02  cm^2/m^2 ---------  velocity    Aorta                                     Value          Reference  Aortic root ID, ED                        25    mm       ---------    Left atrium  Value          Reference  LA ID, A-P, ES                            41    mm       ---------  LA ID/bsa, A-P                    (H)     2.29  cm/m^2   <=2.2  LA volume, S                              49.3  ml       ---------  LA volume/bsa, S                          27.5  ml/m^2   ---------  LA volume, ES, 1-p A4C                    33.2  ml       ---------  LA volume/bsa, ES, 1-p A4C                18.5  ml/m^2   ---------  LA volume, ES, 1-p A2C                    72.6  ml       ---------  LA volume/bsa, ES, 1-p A2C                40.5  ml/m^2   ---------    Mitral valve                               Value          Reference  Mitral E-wave peak velocity               97    cm/s     ---------  Mitral A-wave peak velocity               83.4  cm/s     ---------  Mitral deceleration time          (H)     275   ms       150 - 230  Mitral pressure half-time                 80    ms       ---------  Mitral peak gradient, D                   4     mm Hg    ---------  Mitral E/A ratio, peak                    1.2            ---------  Mitral valve area, PHT, DP                2.75  cm^2     ---------  Mitral valve area/bsa, PHT, DP            1.53  cm^2/m^2 ---------    Right ventricle  Value          Reference  RV ID, minor axis, ED, A4C base           38.2  mm       ---------  TAPSE                                     9.22  mm       ---------  RV s&', lateral, S                         11.5  cm/s     ---------  Legend: (L)  and  (H)  mark values outside specified reference range.  ------------------------------------------------------------------- Prepared and Electronically Authenticated by  Loralie Champagne, M.D. 2018-06-06T23:55:40  Dg Chest 2 View  Result Date: 04/25/2017 CLINICAL DATA:  Atelectasis.  Recent aortic valve replacement EXAM: CHEST  2 VIEW COMPARISON:  April 24, 2017 FINDINGS: There are small pleural effusions bilaterally with mild bibasilar atelectasis. There is also slight atelectasis in the right mid lung. There is no airspace consolidation. Separate pacemaker wires are attached to the right heart. Heart is borderline prominent with pulmonary vascularity within normal limits. There is aortic atherosclerosis. Patient is status post aortic valve replacement. No adenopathy. There is degenerative change in the mid thoracic spine. IMPRESSION: Areas of mild atelectasis at the right mid lung base regions bilaterally. Small pleural effusions bilaterally. No consolidation or appreciable edema. Heart borderline prominent. Aortic atherosclerosis.  Temporary pacemaker wires attached to right heart. Electronically Signed   By: Lowella Grip III M.D.   On: 04/25/2017 07:17   Ct Angio Chest Aorta W &/or Wo Contrast  Result Date: 04/06/2017 CLINICAL DATA:  Aortic valve stenosis.  Preop.  Evaluate aorta. EXAM: CT ANGIOGRAPHY CHEST WITH CONTRAST TECHNIQUE: Multidetector CT imaging of the chest was performed using the standard protocol during bolus administration of intravenous contrast. Multiplanar CT image reconstructions and MIPs were obtained to evaluate the vascular anatomy. CONTRAST:  75of Isovue 370 Creatinine was obtained on site at [facility] at [address] Results: Creatinine 0.7 mg/dL. COMPARISON:  Plain films of 01/18/2013.  Chest CT 02/12/2010. FINDINGS: Cardiovascular: Mild great vessel atherosclerosis. Mild aortic atherosclerosis. No significant aortic dilatation. Aorta measures on the order of 2.7 cm at the sino-tubular junction and 3.4 cm in the mid ascending segment. See coronal series. No dissection. Mild cardiomegaly, accentuated by a pectus excavatum deformity. Multivessel coronary artery atherosclerosis. Aortic valve calcifications. No central pulmonary embolism, on this non-dedicated study. Mediastinum/Nodes: No mediastinal or hilar adenopathy. Lungs/Pleura: No pleural fluid. Secretions in the dependent trachea. [place]a on the right lower lobe. Upper Abdomen: High left hepatic lobe tiny lesion is likely a cyst. Normal imaged portions of the spleen, stomach, pancreas, adrenal glands. Too small to characterize lesions in both kidneys. Accessory upper pole right renal artery. Musculoskeletal: Degenerate changes of both glenohumeral joints. Review of the MIP images confirms the above findings. IMPRESSION: 1. Aortic atherosclerosis, without aneurysm or dissection. 2. Coronary artery atherosclerosis. 3. Aortic valve calcifications, consistent with the clinical history of aortic valve stenosis. Electronically Signed   By: Abigail Miyamoto M.D.   On: 04/06/2017 09:41   Discharge Instructions    Amb Referral to Cardiac Rehabilitation    Complete by:  As directed    Diagnosis:  Valve Replacement   Valve:  Aortic  Discharge Medications: Allergies as of 04/28/2017      Reactions   Bee Venom Shortness Of Breath, Swelling   Crestor [rosuvastatin] Other (See Comments)   MYALGIAS   Diphenhydramine Hcl Other (See Comments)   Shaky legs   Statins Other (See Comments)   MYALGIAS MUSCLE PAIN      Medication List    STOP taking these medications   nitroGLYCERIN 0.4 MG SL tablet Commonly known as:  NITROSTAT     TAKE these medications   ARTIFICIAL TEARS OP Place 1 drop into both eyes at bedtime.   ASHWAGANDHA PO Take 800 mg by mouth daily.   aspirin EC 325 MG tablet Take 1 tablet (325 mg total) by mouth daily. What changed:  medication strength  how much to take   B COMPLEX 100 PO Take 100 mg by mouth daily.   cephALEXin 500 MG capsule Commonly known as:  KEFLEX Take 1 capsule (500 mg total) by mouth every 6 (six) hours.   CHLORELLA-SPIRULINA COMPLEX Tabs Take 1 tablet by mouth daily.   cholecalciferol 400 units Tabs tablet Commonly known as:  VITAMIN D Take 400 Units by mouth daily.   Co Q 10 100 MG Caps Take 100 mg by mouth daily.   Magnesium 250 MG Tabs Take 1 tablet (250 mg total) by mouth daily. What changed:  when to take this   Melatonin 3 MG Tabs Take 3 mg by mouth at bedtime.   Potassium 99 MG Tabs Take 99 mg by mouth daily.   pravastatin 20 MG tablet Commonly known as:  PRAVACHOL Take 20 mg by mouth at bedtime.   Saw Palmetto 450 MG Caps Take 900 mg by mouth 2 (two) times a week.   Selenium 200 MCG Caps Take 200 mcg by mouth.   traMADol 50 MG tablet Commonly known as:  ULTRAM Take 50 mg by mouth every 4-6 hours PRN moderate pain.   Turmeric Curcumin 500 MG Caps Take 500 mg by mouth daily.   vitamin C 500 MG tablet Commonly known as:  ASCORBIC ACID Take 500  mg by mouth daily.   vitamin E 400 UNIT capsule Take 400 Units by mouth daily.   zinc gluconate 50 MG tablet Take 50 mg by mouth daily.      The patient has been discharged on:   1.Beta Blocker:  Yes [   ]                              No   [ x  ]                              If No, reason:Bradycardia, labile BP  2.Ace Inhibitor/ARB: Yes [   ]                                     No  [  x  ]                                     If No, reason:Labile BP  3.Statin:   Yes [ x  ]                  No  [   ]  If No, reason:  4.Ecasa:  Yes  [  x ]                  No   [   ]                  If No, reason:  Follow Up Appointments: Follow-up Information    Gaye Pollack, MD Follow up on 05/23/2017.   Specialty:  Cardiothoracic Surgery Why:  PA/LAT CXR to be taken (at [facility] which is in the same building as Dr. Vivi Martens office) on 05/23/2017 at 12:30 pm;Appointment time is at 1:00 pm Contact information: [place] [address] [phone]        Nurse Follow up on 05/05/2017.   Why:  Appointment is with nurse only and is to have chest tube sutures removed. Appointment time is at 10:00 am Contact information: [address] [place] [address]       Charlie Pitter, PA-C. Go on 05/10/2017.   Specialties:  Cardiology, Radiology Why:  Appointment time is at 2:30 pm Contact information: [address] [place] [address] [phone]           Signed: Lars Pinks MPA-C 04/28/2017, 8:23 AM

## 2017-04-25 NOTE — Progress Notes (Addendum)
      [address]       [place] [address]             [phone]        3 Days Post-Op Procedure(s) (LRB): AORTIC VALVE REPLACEMENT (AVR) using Magna Ease size 67mm (N/A) TRANSESOPHAGEAL ECHOCARDIOGRAM (TEE) (N/A)  Subjective: Patient eating breakfast this am. He had loose stools after stool softeners the other day and his stool was dark. No bowel movement since then.  Objective: Vital signs in last 24 hours: Temp:  [97.2 F (36.2 C)-98 F (36.7 C)] 97.2 F (36.2 C) (06/04 0300) Pulse Rate:  [65-73] 70 (06/04 0300) Cardiac Rhythm: Normal sinus rhythm (06/04 0700) Resp:  [12-22] 18 (06/04 0300) BP: (102-125)/(50-61) 104/52 (06/04 0300) SpO2:  [92 %-100 %] 97 % (06/04 0300) Weight:  [66.3 kg (146 lb 3.2 oz)] 66.3 kg (146 lb 3.2 oz) (06/04 0300)  Pre op weight 65.1 kg Current Weight  04/25/17 66.3 kg (146 lb 3.2 oz)      Intake/Output from previous day: 06/03 0701 - 06/04 0700 In: -  Out: 654 [Urine:635]   Physical Exam:  Cardiovascular: RRR, no murmur Pulmonary: Clear to auscultation bilaterally Abdomen: Soft, non tender, bowel sounds present. Extremities: Trace bilateral lower extremity edema. Wounds: Dressing removed and sternal wound is clean and dry.  No erythema or signs of infection.  Lab Results: CBC: Recent Labs  04/24/17 0630 04/25/17 0223  WBC 7.8 8.0  HGB 11.3* 10.7*  HCT 33.2* 32.1*  PLT 84* 84*   BMET:  Recent Labs  04/24/17 0630 04/25/17 0223  NA 135 139  K 3.7 3.7  CL 105 107  CO2 25 26  GLUCOSE 102* 93  BUN 11 11  CREATININE 0.50* 0.63  CALCIUM 8.0* 8.2*    PT/INR:  Lab Results  Component Value Date   INR 1.47 04/22/2017   INR 1.08 04/20/2017   INR 1.0 04/06/2017   ABG:  INR: Will add last result for INR, ABG once components are confirmed Will add last 4 CBG results once components are confirmed  Assessment/Plan:  1. CV - SR/PVCs in the 70's. SBP in the low 100's. Not on Lopressor but able to  consider when BP improves. 2.  Pulmonary - On room air. CXR this am shows small bilateral pleural effusions and atelectasis, no pneumothorax. Encourage incentive spirometer. 3. Volume Overload - On Lasix 40 mg daily 4.  Acute blood loss anemia - H and H slightly decreased to 10.7 and 32.1 5. Supplement potassium 6. Thrombocytopenia-platelets stable at 84,000 7. Remove EPW 8. Will ask nurse to remove peripheral right antecubital IV as some erythema around it. 9. Possibly discharge in am  ZIMMERMAN,DONIELLE MPA-C 04/25/2017,7:47 AM   Chart reviewed, patient examined, agree with above. He feels well and is ambulating well. Bowels working. 2250 on IS. He should be able to go home tomorrow if no changes.

## 2017-04-25 NOTE — Discharge Instructions (Signed)
Aortic Valve Replacement, Care After °Refer to this sheet in the next few weeks. These instructions provide you with information about caring for yourself after your procedure. Your health care provider may also give you more specific instructions. Your treatment has been planned according to current medical practices, but problems sometimes occur. Call your health care provider if you have any problems or questions after your procedure. °What can I expect after the procedure? °After the procedure, it is common to have: °· Pain around your incision area. °· A small amount of blood or clear fluid coming from your incision. ° °Follow these instructions at home: °Eating and drinking ° °· Follow instructions from your health care provider about eating or drinking restrictions. °? Limit alcohol intake to no more than 1 drink per day for nonpregnant women and 2 drinks per day for men. One drink equals 12 oz of beer, 5 oz of wine, or 1½ oz of hard liquor. °? Limit how much caffeine you drink. Caffeine can affect your heart's rate and rhythm. °· Drink enough fluid to keep your urine clear or pale yellow. °· Eat a heart-healthy diet. This should include plenty of fresh fruits and vegetables. If you eat meat, it should be lean cuts. Avoid foods that are: °? High in salt, saturated fat, or sugar. °? Canned or highly processed. °? Fried. °Activity °· Return to your normal activities as told by your health care provider. Ask your health care provider what activities are safe for you. °· Exercise regularly once you have recovered, as told by your health care provider. °· Avoid sitting for more than 2 hours at a time without moving. Get up and move around at least once every 1-2 hours. This helps to prevent blood clots in the legs. °· Do not lift anything that is heavier than 10 lb (4.5 kg) until your health care provider approves. °· Avoid pushing or pulling things with your arms until your health care provider approves. This  includes pulling on handrails to help you climb stairs. °Incision care ° °· Follow instructions from your health care provider about how to take care of your incision. Make sure you: °? Wash your hands with soap and water before you change your bandage (dressing). If soap and water are not available, use hand sanitizer. °? Change your dressing as told by your health care provider. °? Leave stitches (sutures), skin glue, or adhesive strips in place. These skin closures may need to stay in place for 2 weeks or longer. If adhesive strip edges start to loosen and curl up, you may trim the loose edges. Do not remove adhesive strips completely unless your health care provider tells you to do that. °· Check your incision area every day for signs of infection. Check for: °? More redness, swelling, or pain. °? More fluid or blood. °? Warmth. °? Pus or a bad smell. °Medicines °· Take over-the-counter and prescription medicines only as told by your health care provider. °· If you were prescribed an antibiotic medicine, take it as told by your health care provider. Do not stop taking the antibiotic even if you start to feel better. °Travel °· Avoid airplane travel for as long as told by your health care provider. °· When you travel, bring a list of your medicines and a record of your medical history with you. Carry your medicines with you. °Driving °· Ask your health care provider when it is safe for you to drive. Do not drive until your health   care provider approves. °· Do not drive or operate heavy machinery while taking prescription pain medicine. °Lifestyle ° °· Do not use any tobacco products, such as cigarettes, chewing tobacco, or e-cigarettes. If you need help quitting, ask your health care provider. °· Resume sexual activity as told by your health care provider. Do not use medicines for erectile dysfunction unless your health care provider approves, if this applies. °· Work with your health care provider to keep your  blood pressure and cholesterol under control, and to manage any other heart conditions that you have. °· Maintain a healthy weight. °General instructions °· Do not take baths, swim, or use a hot tub until your health care provider approves. °· Do not strain to have a bowel movement. °· Avoid crossing your legs while sitting down. °· Check your temperature every day for a fever. A fever may be a sign of infection. °· If you are a woman and you plan to become pregnant, talk with your health care provider before you become pregnant. °· Wear compression stockings if your health care provider instructs you to do this. These stockings help to prevent blood clots and reduce swelling in your legs. °· Tell all health care providers who care for you that you have an artificial (prosthetic) aortic valve. If you have or have had heart disease or endocarditis, tell all health care providers about these conditions as well. °· Keep all follow-up visits as told by your health care provider. This is important. °Contact a health care provider if: °· You develop a skin rash. °· You experience sudden, unexplained changes in your weight. °· You have more redness, swelling, or pain around your incision. °· You have more fluid or blood coming from your incision. °· Your incision feels warm to the touch. °· You have pus or a bad smell coming from your incision. °· You have a fever. °Get help right away if: °· You develop chest pain that is different from the pain coming from your incision. °· You develop shortness of breath or difficulty breathing. °· You start to feel light-headed. °These symptoms may represent a serious problem that is an emergency. Do not wait to see if the symptoms will go away. Get medical help right away. Call your local emergency services (911 in the [place] Do not drive yourself to the hospital. °This information is not intended to replace advice given to you by your health care provider. Make sure you discuss any  questions you have with your health care provider. °Document Released: 05/27/2005 Document Revised: 04/15/2016 Document Reviewed: 10/12/2015 °Elsevier Interactive Patient Education © 2017 Elsevier Inc. ° °

## 2017-04-25 NOTE — Progress Notes (Signed)
CARDIAC REHAB PHASE I   PRE:  Rate/Rhythm: 72 SR    BP: sitting 110/58    SaO2: 98 RA  MODE:  Ambulation: 850 ft   POST:  Rate/Rhythm: 87 SR with PVCs    BP: sitting 116/64     SaO2: 97 RA  Pt doing very well. Stood independently and walked with only supervision. No c/o and talked the entire walk. Gave pt d/c video to watch. Encouraged more walking and IS. Doing 1750 mL. Has d/c plan set up with friends. [place], ACSM 04/25/2017 8:57 AM

## 2017-04-25 NOTE — Progress Notes (Signed)
Epicardial pacing wires removed per order. Wire ends intact. Sites clean and dry. Pt experiencing shivering before wire removal and continued after removal. Pt's HR elevated from shivering and BP difficult to obtain. PA notified. This RN was instructed to keep an eye on the pt. Will continue to monitor.  Grant Fontana BSN, RN

## 2017-04-25 NOTE — Consult Note (Signed)
[facility] CM Primary Care Navigator  04/25/2017  KASSEM KIBBE 23-Mar-1941 [id number]   Met with patient and friend Vickii Chafe) at the bedside to identify possible discharge needs. Patientreports having recent episode of headache and blurred vision and he felt like he was going to pass out which had led to this admission/ surgery. Patient endorses Dr. Darcus Austin with [place] [facility] as hisprimary care provider.   Patient shared using [facility] obtain medications without any problem.   Patient verbalized that hemanageshismedications at home.  Patient mentioned that he was driving prior to admission/ surgery and hopes to do the same when he recovers. He reports that friends Santiago Glad and Vickii Chafe) or his neighbor will provide transportation tohisdoctors'appointments after discharge.   Patient lives alone. He mentioned that her friend Chief Operating Officer) has arranged for a friend Santiago Glad) to stay at his house for several weeks to serve as his primary caregiver after discharge as he recovers.His neighbor also helps by picking up groceries for him.  Anticipated discharge plan ishome with friend's assistance per patient.  Patient voiced understanding to call and follow-up with primary care providerwhen he returns home,for a post dischargevisitwithin a week or sooner if needed.Patient letter (with PCP's contact number) was provided as a reminder.  Patient denies any health management needs or concerns at this time.  [facility] care management information provided for future needs that may arise.   For questions, please contact:  Dannielle Huh, BSN, RN- [facility] Primary Care Navigator  Telephone: [phone] [place]

## 2017-04-25 NOTE — Assessment & Plan Note (Signed)
Magna Ease size 25 mm

## 2017-04-26 MED ORDER — CEPHALEXIN 500 MG PO CAPS: 500.0000 mg | ORAL_CAPSULE | Freq: Three times a day (TID) | ORAL | Status: DC

## 2017-04-26 MED ORDER — CEPHALEXIN 500 MG PO CAPS
500.0000 mg | ORAL_CAPSULE | Freq: Four times a day (QID) | ORAL | Status: DC
  Administered 2017-04-26 – 2017-04-28 (×9): 500 mg via ORAL
  Filled 2017-04-26 (×9): qty 1

## 2017-04-26 NOTE — Progress Notes (Signed)
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 Had 3 episodes of chills without fever. Had some hallucinations yesterday. Tired this am because he did not sleep well.  Objective: Vital signs in last 24 hours: Temp:  [97.9 F (36.6 C)-99.2 F (37.3 C)] 97.9 F (36.6 C) (06/05 0513) Pulse Rate:  [65-128] 65 (06/05 0513) Cardiac Rhythm: Normal sinus rhythm (06/05 0700) Resp:  [17-18] 18 (06/05 0513) BP: (90-139)/(48-91) 101/49 (06/05 0654) SpO2:  [97 %-99 %] 98 % (06/05 0513) Weight:  [64.8 kg (142 lb 14.4 oz)] 64.8 kg (142 lb 14.4 oz) (06/05 0513)  Hemodynamic parameters for last 24 hours:    Intake/Output from previous day: 06/04 0701 - 06/05 0700 In: 240 [P.O.:240] Out: -  Intake/Output this shift: No intake/output data recorded.  General appearance: alert and cooperative Neurologic: intact Heart: regular rate and rhythm, S1, S2 normal, no murmur, click, rub or gallop Lungs: clear to auscultation bilaterally Abdomen: soft, non-tender; bowel sounds normal; no masses,  no organomegaly Extremities: no edema. There is phlebitis in the right antecubital fossa with firm cord and mild erythema where IV removed yesterday. Wound: incisions healing well.  Lab Results:  Recent Labs  04/24/17 0630 04/25/17 0223  WBC 7.8 8.0  HGB 11.3* 10.7*  HCT 33.2* 32.1*  PLT 84* 84*   BMET:  Recent Labs  04/24/17 0630 04/25/17 0223  NA 135 139  K 3.7 3.7  CL 105 107  CO2 25 26  GLUCOSE 102* 93  BUN 11 11  CREATININE 0.50* 0.63  CALCIUM 8.0* 8.2*    PT/INR: No results for input(s): LABPROT, INR in the last 72 hours. ABG    Component Value Date/Time   PHART 7.352 04/22/2017 1723   HCO3 21.3 04/22/2017 1723   TCO2 26 04/23/2017 1716   ACIDBASEDEF 4.0 (H) 04/22/2017 1723   O2SAT 98.0 04/22/2017 1723   CBG (last 3)   Recent Labs  04/23/17 2349 04/24/17 0342 04/24/17 0850  GLUCAP 82 116* 84    Assessment/Plan: S/P Procedure(s) (LRB): AORTIC VALVE REPLACEMENT (AVR) using Magna Ease size 60mm (N/A) TRANSESOPHAGEAL ECHOCARDIOGRAM (TEE) (N/A)  He is hemodynamically stable in sinus rhythm. BP is low normal so not on beta blocker.  Did not feel well yesterday afternoon and last night but better this am. Had some chills but no fever. Not clear if this was due to room being cold ( which he thinks) or possibly from right arm phlebitis. Will put on Keflex this am for phlebitis. Observe today. Will check CBC in am.  Continue mobilization, IS.  Observe today and plan home tomorrow if no changes.   LOS: 4 days    Gaye Pollack 04/26/2017

## 2017-04-26 NOTE — Progress Notes (Signed)
CARDIAC REHAB PHASE I   PRE:  Rate/Rhythm: 64 SR    BP: sitting 80/40    SaO2: 98 RA  MODE:  Ambulation: 800 ft   POST:  Rate/Rhythm: 75 SR    BP: sitting 100/50     SaO2: 98 RA  Pt has been walking regularly, no c/o. Feels well. BP low in bed but only slightly lightheaded when he got up. Talked entire walk. Discussed CRPII and will send referral to [facility]. Pt has done program before.  [place], ACSM 04/26/2017 2:00 PM

## 2017-04-27 ENCOUNTER — Inpatient Hospital Stay (HOSPITAL_COMMUNITY): Payer: Medicare Other

## 2017-04-27 LAB — CBC
HCT: 28.6 % — ABNORMAL LOW (ref 39.0–52.0)
HEMOGLOBIN: 9.6 g/dL — AB (ref 13.0–17.0)
MCH: 30.7 pg (ref 26.0–34.0)
MCHC: 33.6 g/dL (ref 30.0–36.0)
MCV: 91.4 fL (ref 78.0–100.0)
PLATELETS: 64 10*3/uL — AB (ref 150–400)
RBC: 3.13 MIL/uL — ABNORMAL LOW (ref 4.22–5.81)
RDW: 13.1 % (ref 11.5–15.5)
WBC: 7.9 10*3/uL (ref 4.0–10.5)

## 2017-04-27 LAB — ECHOCARDIOGRAM COMPLETE
Height: 70 in
WEIGHTICAEL: 2310.4 [oz_av]

## 2017-04-27 MED ORDER — MAGNESIUM 250 MG PO TABS: 250.0000 mg | ORAL_TABLET | Freq: Every day | ORAL | 0 refills | Status: DC

## 2017-04-27 MED ORDER — CEPHALEXIN 500 MG PO CAPS: 500.0000 mg | ORAL_CAPSULE | Freq: Four times a day (QID) | ORAL | 0 refills | Status: DC

## 2017-04-27 MED ORDER — ASPIRIN EC 325 MG PO TBEC: 325.0000 mg | DELAYED_RELEASE_TABLET | Freq: Every day | ORAL | Status: DC

## 2017-04-27 MED ORDER — TRAMADOL HCL 50 MG PO TABS: ORAL_TABLET | ORAL | 0 refills | Status: DC

## 2017-04-27 MED FILL — Magnesium Sulfate Inj 50%: INTRAMUSCULAR | Qty: 10 | Status: AC

## 2017-04-27 MED FILL — Heparin Sodium (Porcine) Inj 1000 Unit/ML: INTRAMUSCULAR | Qty: 2500 | Status: AC

## 2017-04-27 MED FILL — Potassium Chloride Inj 2 mEq/ML: INTRAVENOUS | Qty: 40 | Status: AC

## 2017-04-27 MED FILL — Heparin Sodium (Porcine) Inj 1000 Unit/ML: INTRAMUSCULAR | Qty: 30 | Status: AC

## 2017-04-27 NOTE — Care Management Important Message (Signed)
Important Message  Patient Details  Name: Darin Simmons MRN: [id number] Date of Birth: [birth year]   Medicare Important Message Given:  Yes    [name] May 04/27/2017, 9:29 AM

## 2017-04-27 NOTE — Progress Notes (Signed)
CARDIAC REHAB PHASE I   PRE:  Rate/Rhythm: 37 SR  BP:  Supine:   Sitting: 129/61  Standing:    SaO2: 99%RA  MODE:  Ambulation: 600 ft   POST:  Rate/Rhythm: 83  BP:  Supine:   Sitting: 170/70  Standing:    SaO2: 93-96%RA 1345-1405 Pt walked 600 ft on RA and could have gone farther but he started to get chilled and stated had to get back to room quickly. Encouraged pt to not walk so fast. Blanket to shoulders as soon as he sat. BP elevated. Pt felt better with blanket. Set up lunch in front of him. Referring to [place] CRP 2.   Graylon Good, RN BSN  04/27/2017 2:03 PM

## 2017-04-27 NOTE — Progress Notes (Addendum)
      [address]       [place] [address]             [phone]        5 Days Post-Op Procedure(s) (LRB): AORTIC VALVE REPLACEMENT (AVR) using Magna Ease size 51mm (N/A) TRANSESOPHAGEAL ECHOCARDIOGRAM (TEE) (N/A)  Subjective: Patient states he had increased pain across sternum yesterday. He is not sleeping well.  Objective: Vital signs in last 24 hours: Temp:  [98.1 F (36.7 C)-98.5 F (36.9 C)] 98.5 F (36.9 C) (06/06 0536) Pulse Rate:  [67-71] 67 (06/06 0536) Cardiac Rhythm: Normal sinus rhythm (06/06 0700) Resp:  [18] 18 (06/06 0536) BP: (99-108)/(50-56) 107/56 (06/06 0536) SpO2:  [96 %-99 %] 96 % (06/06 0536) Weight:  [65.5 kg (144 lb 6.4 oz)] 65.5 kg (144 lb 6.4 oz) (06/06 0536)  Pre op weight 65.1 kg Current Weight  04/27/17 65.5 kg (144 lb 6.4 oz)      Intake/Output from previous day: 06/05 0701 - 06/06 0700 In: 960 [P.O.:960] Out: -    Physical Exam:  Cardiovascular: RRR, no murmur Pulmonary: Clear to auscultation bilaterally Abdomen: Soft, non tender, bowel sounds present. Extremities: No lower extremity edema. Right forearm without erythema this am. There is an area of firmness over vein. Wounds: Sternal wound is clean and dry.  No erythema or signs of infection.  Lab Results: CBC:  Recent Labs  04/25/17 0223 04/27/17 0212  WBC 8.0 7.9  HGB 10.7* 9.6*  HCT 32.1* 28.6*  PLT 84* 64*   BMET:   Recent Labs  04/25/17 0223  NA 139  K 3.7  CL 107  CO2 26  GLUCOSE 93  BUN 11  CREATININE 0.63  CALCIUM 8.2*    PT/INR:  Lab Results  Component Value Date   INR 1.47 04/22/2017   INR 1.08 04/20/2017   INR 1.0 04/06/2017   ABG:  INR: Will add last result for INR, ABG once components are confirmed Will add last 4 CBG results once components are confirmed  Assessment/Plan:  1. CV - SR in the 60's. SBP in the low 100's. Not on Lopressor but able to consider when BP improves after discharge. 2.  Pulmonary - On room  air. Encourage incentive spirometer. 3.  Acute blood loss anemia - H and H decreased to 9.6 and 28.6 4. Thrombocytopenia-platelets decreased to 64,000. He has not been on Lovenox, only ecasa 5. Phlebitis right forearm-started on Keflex yesterday. WBC remains normal at 7900 and no fever. 6. Will restart Pravastatin as taken pre op at discharge 7. Will discuss disposition with Dr. Jon Billings MPA-C 04/27/2017,7:46 AM   Chart reviewed, patient examined, agree with above. He feels better this am. Says he slept better last night. Some chest wall pain.  His platelets have dropped to 64K and this is most likely related to surgery. They were only 125K preop and 90K immediately postop. He has been on Lovenox postop and will stop that. He is on Keflex but platelets were already low before that was started yesterday. Hgb has trending down over the past few days. Will check an echo today to be sure he does not have a significant pericardial effusion. Will repeat CBC in am and probably send home tomorrow.

## 2017-04-28 LAB — CBC
HCT: 29.2 % — ABNORMAL LOW (ref 39.0–52.0)
HEMOGLOBIN: 9.8 g/dL — AB (ref 13.0–17.0)
MCH: 31.1 pg (ref 26.0–34.0)
MCHC: 33.6 g/dL (ref 30.0–36.0)
MCV: 92.7 fL (ref 78.0–100.0)
Platelets: 75 10*3/uL — ABNORMAL LOW (ref 150–400)
RBC: 3.15 MIL/uL — ABNORMAL LOW (ref 4.22–5.81)
RDW: 13 % (ref 11.5–15.5)
WBC: 8.2 10*3/uL (ref 4.0–10.5)

## 2017-04-28 MED ORDER — CEPHALEXIN 500 MG PO CAPS: 500.0000 mg | ORAL_CAPSULE | Freq: Four times a day (QID) | ORAL | 0 refills | Status: DC

## 2017-04-28 MED ORDER — TRAMADOL HCL 50 MG PO TABS: ORAL_TABLET | ORAL | 0 refills | Status: DC

## 2017-04-28 NOTE — Progress Notes (Signed)
Pt received discharge instructions and all questions were answered. Telemetry box removed for discharge and CCMD notified. Pt is waiting for his ride to arrive.   Grant Fontana BSN, RN

## 2017-04-28 NOTE — Progress Notes (Addendum)
6 Days Post-Op Procedure(s) (LRB): AORTIC VALVE REPLACEMENT (AVR) using Magna Ease size 3mm (N/A) TRANSESOPHAGEAL ECHOCARDIOGRAM (TEE) (N/A) Subjective: No complaints, slept well  Objective: Vital signs in last 24 hours: Temp:  [98.7 F (37.1 C)-99.8 F (37.7 C)] 99.8 F (37.7 C) (06/07 0506) Pulse Rate:  [72-82] 72 (06/07 0506) Cardiac Rhythm: Normal sinus rhythm (06/06 1952) Resp:  [18] 18 (06/07 0506) BP: (115-129)/(56-67) 117/67 (06/07 0506) SpO2:  [96 %-99 %] 97 % (06/07 0506) Weight:  [66.5 kg (146 lb 11.2 oz)] 66.5 kg (146 lb 11.2 oz) (06/07 0506)  Hemodynamic parameters for last 24 hours:    Intake/Output from previous day: 06/06 0701 - 06/07 0700 In: 360 [P.O.:360] Out: -  Intake/Output this shift: No intake/output data recorded.  General appearance: alert and cooperative Heart: regular rate and rhythm, S1, S2 normal, no murmur, click, rub or gallop Lungs: clear to auscultation bilaterally Extremities: no edema. Right antecubital phlebitis improving. Wound: incision ok  Lab Results:  Recent Labs  04/27/17 0212 04/28/17 0200  WBC 7.9 8.2  HGB 9.6* 9.8*  HCT 28.6* 29.2*  PLT 64* 75*   BMET: No results for input(s): NA, K, CL, CO2, GLUCOSE, BUN, CREATININE, CALCIUM in the last 72 hours.  PT/INR: No results for input(s): LABPROT, INR in the last 72 hours. ABG    Component Value Date/Time   PHART 7.352 04/22/2017 1723   HCO3 21.3 04/22/2017 1723   TCO2 26 04/23/2017 1716   ACIDBASEDEF 4.0 (H) 04/22/2017 1723   O2SAT 98.0 04/22/2017 1723   CBG (last 3)  No results for input(s): GLUCAP in the last 72 hours.  Assessment/Plan: S/P Procedure(s) (LRB): AORTIC VALVE REPLACEMENT (AVR) using Magna Ease size 36mm (N/A) TRANSESOPHAGEAL ECHOCARDIOGRAM (TEE) (N/A)  He is hemodynamically stable in sinus rhythm.  I reviewed 2D echo and his aortic valve prosthesis looks good with low gradient. LV function good with EF 55%, no pericardial effusion.  Hgb rising  a little and plts increased.   I think he can go home today. Would treat for phlebitis for one week with Keflex.    LOS: 6 days    Gaye Pollack 04/28/2017

## 2017-04-28 NOTE — Progress Notes (Signed)
CARDIAC REHAB PHASE I   Pt ambulating independently now, no complaints. Cardiac surgery discharge education completed. Reviewed IS, sternal precautions, activity progression, exercise guidelines, heart healthy diet, daily weights, and phase 2 cardiac rehab. Pt verbalized understanding. Phase 2 cardiac rehab referral sent to [facility] per pt request. Pt up ad lib in room, for discharge today.   [phone] Lenna Sciara, RN, BSN 04/28/2017 9:49 AM

## 2017-04-28 NOTE — Care Management Note (Signed)
Case Management Note Marvetta Gibbons RN, BSN Unit 2W-Case Manager [phone]  Patient Details  Name: Darin Simmons MRN: [id number] Date of Birth: [birth year]  Subjective/Objective:   Pt admitted s/p AVR                 Action/Plan: PTA pt lived at home with spouse- plan to d/c home with spouse- no CM needs noted for discharge.   Expected Discharge Date:  04/28/17               Expected Discharge Plan:  Home/Self Care  In-House Referral:     Discharge planning Services  CM Consult  Post Acute Care Choice:  NA Choice offered to:  NA  DME Arranged:    DME Agency:     HH Arranged:    HH Agency:     Status of Service:  Completed, signed off  If discussed at [place] of Stay Meetings, dates discussed:  04/28/17  Discharge Disposition: home/self care  Additional Comments:  Dawayne Patricia, RN 04/28/2017, 10:03 AM

## 2017-04-28 NOTE — Progress Notes (Signed)
Pt discharged home. Pt left with all of his belongings. Pt left the unit via wheelchair and was accompanied by a Wilfrid Lund.  Grant Fontana BSN, RN

## 2017-04-30 ENCOUNTER — Telehealth: Payer: Self-pay | Admitting: Physician Assistant

## 2017-04-30 NOTE — Telephone Encounter (Signed)
    Called about wound redness. No fevers or chills. No purulent drainage. Feeling quite well. Having memory issues and doesn't know if it looks worse than when he was discharged. I provided reassurance. He will call [facility] office if he continues to have concerns on Monday.  Angelena Form PA-C  [facility]

## 2017-05-04 ENCOUNTER — Telehealth: Payer: Self-pay | Admitting: Interventional Cardiology

## 2017-05-04 ENCOUNTER — Telehealth (HOSPITAL_COMMUNITY): Payer: Self-pay

## 2017-05-04 NOTE — Telephone Encounter (Signed)
New message   Pt verbalized that he accidentally called the paramedics and that they changed the dressing  His bp was good, all vitals was good    1. Has your device fired? yes  2. Is you device beeping? no  3. Are you experiencing draining or swelling at device site? no 4. Are you calling to see if we received your device transmission? no  5. Have you passed out? No  Pt is feeling better,no issues today but he wants rn to call and for Dr.Varanasi to be aware

## 2017-05-04 NOTE — Telephone Encounter (Signed)
Patient insurance are active and benefits verified. Patient has Medicare A/B and [place] A/B - no co-payment, deductible $183.00/$183.00 has been met, 20% co-insurance, no pre-authorization and no limit on visit. Passport/reference [phone]. [employer] medicare supplement - no co-payment, no deductible, no out of pocket, no co-insurance, no pre-authorization and no limit on visit. Follows medicare guidelines. Passport/reference [phone].  Patient will be contacted and scheduled after their follow up appointment with the cardiologist on 05/10/17 and surgeon on 05/23/17, upon review by [facility] RN navigator.

## 2017-05-04 NOTE — Telephone Encounter (Signed)
Patient does not have a device.  Patient calling about drainage from his surgical incision. He states that he was having clear drainage from his surgical incision that he noticed on his shirt. He states that he has a guaze in place and has had to change it once. Patient states that he has an appointment at [facility] tomorrow for the removal of his chest tube sutures. Advised for the patient to call their office and let them know about the drainage and that they will want to see his incision at his appointment tomorrow. Patient verbalized understanding and is in agreement with this plan.

## 2017-05-05 ENCOUNTER — Inpatient Hospital Stay (HOSPITAL_COMMUNITY): Payer: Medicare Other

## 2017-05-05 ENCOUNTER — Other Ambulatory Visit: Payer: Self-pay | Admitting: Surgery

## 2017-05-05 ENCOUNTER — Telehealth: Payer: Self-pay

## 2017-05-05 ENCOUNTER — Inpatient Hospital Stay (HOSPITAL_COMMUNITY)
Admission: AD | Admit: 2017-05-05 | Discharge: 2017-05-15 | DRG: 857 | Disposition: A | Discharge status: Home / Self Care | Payer: Medicare Other | Source: Ambulatory Visit | Attending: Surgery | Admitting: Surgery

## 2017-05-05 ENCOUNTER — Encounter: Payer: Self-pay | Admitting: Cardiothoracic Surgery

## 2017-05-05 ENCOUNTER — Encounter (HOSPITAL_COMMUNITY): Payer: Self-pay | Admitting: General Practice

## 2017-05-05 ENCOUNTER — Ambulatory Visit (INDEPENDENT_AMBULATORY_CARE_PROVIDER_SITE_OTHER): Payer: Self-pay | Admitting: Cardiothoracic Surgery

## 2017-05-05 VITALS — BP 130/68 | Temp 87.7°F | Resp 16 | Ht 70.0 in | Wt 144.0 lb

## 2017-05-05 DIAGNOSIS — Z955 Presence of coronary angioplasty implant and graft: Secondary | ICD-10-CM

## 2017-05-05 DIAGNOSIS — Z888 Allergy status to other drugs, medicaments and biological substances status: Secondary | ICD-10-CM

## 2017-05-05 DIAGNOSIS — Z91030 Bee allergy status: Secondary | ICD-10-CM | POA: Diagnosis not present

## 2017-05-05 DIAGNOSIS — T814XXD Infection following a procedure, subsequent encounter: Secondary | ICD-10-CM | POA: Diagnosis not present

## 2017-05-05 DIAGNOSIS — I739 Peripheral vascular disease, unspecified: Secondary | ICD-10-CM | POA: Diagnosis present

## 2017-05-05 DIAGNOSIS — Z952 Presence of prosthetic heart valve: Secondary | ICD-10-CM | POA: Diagnosis not present

## 2017-05-05 DIAGNOSIS — Z87891 Personal history of nicotine dependence: Secondary | ICD-10-CM | POA: Diagnosis not present

## 2017-05-05 DIAGNOSIS — E7800 Pure hypercholesterolemia, unspecified: Secondary | ICD-10-CM | POA: Diagnosis present

## 2017-05-05 DIAGNOSIS — I9789 Other postprocedural complications and disorders of the circulatory system, not elsewhere classified: Secondary | ICD-10-CM | POA: Diagnosis not present

## 2017-05-05 DIAGNOSIS — Z7982 Long term (current) use of aspirin: Secondary | ICD-10-CM

## 2017-05-05 DIAGNOSIS — Y838 Other surgical procedures as the cause of abnormal reaction of the patient, or of later complication, without mention of misadventure at the time of the procedure: Secondary | ICD-10-CM | POA: Diagnosis not present

## 2017-05-05 DIAGNOSIS — T8131XA Disruption of external operation (surgical) wound, not elsewhere classified, initial encounter: Secondary | ICD-10-CM | POA: Diagnosis present

## 2017-05-05 DIAGNOSIS — I472 Ventricular tachycardia: Secondary | ICD-10-CM | POA: Diagnosis present

## 2017-05-05 DIAGNOSIS — L089 Local infection of the skin and subcutaneous tissue, unspecified: Secondary | ICD-10-CM | POA: Diagnosis not present

## 2017-05-05 DIAGNOSIS — Z79899 Other long term (current) drug therapy: Secondary | ICD-10-CM | POA: Diagnosis not present

## 2017-05-05 DIAGNOSIS — IMO0001 Reserved for inherently not codable concepts without codable children: Secondary | ICD-10-CM

## 2017-05-05 DIAGNOSIS — R7881 Bacteremia: Secondary | ICD-10-CM | POA: Diagnosis present

## 2017-05-05 DIAGNOSIS — Z953 Presence of xenogenic heart valve: Secondary | ICD-10-CM | POA: Diagnosis not present

## 2017-05-05 DIAGNOSIS — L0390 Cellulitis, unspecified: Secondary | ICD-10-CM

## 2017-05-05 DIAGNOSIS — T814XXA Infection following a procedure, initial encounter: Principal | ICD-10-CM | POA: Diagnosis present

## 2017-05-05 DIAGNOSIS — T148XXA Other injury of unspecified body region, initial encounter: Secondary | ICD-10-CM

## 2017-05-05 DIAGNOSIS — M1990 Unspecified osteoarthritis, unspecified site: Secondary | ICD-10-CM | POA: Diagnosis not present

## 2017-05-05 DIAGNOSIS — N400 Enlarged prostate without lower urinary tract symptoms: Secondary | ICD-10-CM | POA: Diagnosis not present

## 2017-05-05 DIAGNOSIS — I2510 Atherosclerotic heart disease of native coronary artery without angina pectoris: Secondary | ICD-10-CM | POA: Diagnosis present

## 2017-05-05 DIAGNOSIS — I517 Cardiomegaly: Secondary | ICD-10-CM | POA: Diagnosis not present

## 2017-05-05 DIAGNOSIS — L0889 Other specified local infections of the skin and subcutaneous tissue: Secondary | ICD-10-CM | POA: Diagnosis not present

## 2017-05-05 DIAGNOSIS — M868X8 Other osteomyelitis, other site: Secondary | ICD-10-CM | POA: Diagnosis not present

## 2017-05-05 DIAGNOSIS — A498 Other bacterial infections of unspecified site: Secondary | ICD-10-CM

## 2017-05-05 DIAGNOSIS — M869 Osteomyelitis, unspecified: Secondary | ICD-10-CM

## 2017-05-05 DIAGNOSIS — I350 Nonrheumatic aortic (valve) stenosis: Secondary | ICD-10-CM

## 2017-05-05 DIAGNOSIS — T8149XA Infection following a procedure, other surgical site, initial encounter: Secondary | ICD-10-CM

## 2017-05-05 DIAGNOSIS — S21109A Unspecified open wound of unspecified front wall of thorax without penetration into thoracic cavity, initial encounter: Secondary | ICD-10-CM | POA: Diagnosis not present

## 2017-05-05 DIAGNOSIS — I495 Sick sinus syndrome: Secondary | ICD-10-CM | POA: Diagnosis not present

## 2017-05-05 DIAGNOSIS — J90 Pleural effusion, not elsewhere classified: Secondary | ICD-10-CM | POA: Diagnosis not present

## 2017-05-05 DIAGNOSIS — B9689 Other specified bacterial agents as the cause of diseases classified elsewhere: Secondary | ICD-10-CM | POA: Diagnosis not present

## 2017-05-05 HISTORY — DX: Local infection of the skin and subcutaneous tissue, unspecified: L08.9

## 2017-05-05 HISTORY — DX: Other injury of unspecified body region, initial encounter: T14.8XXA

## 2017-05-05 LAB — BASIC METABOLIC PANEL
Anion gap: 12 (ref 5–15)
BUN: 11 mg/dL (ref 6–20)
CO2: 21 mmol/L — ABNORMAL LOW (ref 22–32)
CREATININE: 0.65 mg/dL (ref 0.61–1.24)
Calcium: 8.7 mg/dL — ABNORMAL LOW (ref 8.9–10.3)
Chloride: 104 mmol/L (ref 101–111)
GFR calc Af Amer: >60 mL/min (ref >60)
GLUCOSE: 88 mg/dL (ref 65–99)
POTASSIUM: 4.4 mmol/L (ref 3.5–5.1)
SODIUM: 137 mmol/L (ref 135–145)

## 2017-05-05 LAB — CBC
HCT: 30.6 % — ABNORMAL LOW (ref 39.0–52.0)
Hemoglobin: 10.1 g/dL — ABNORMAL LOW (ref 13.0–17.0)
MCH: 30.1 pg (ref 26.0–34.0)
MCHC: 33 g/dL (ref 30.0–36.0)
MCV: 91.1 fL (ref 78.0–100.0)
PLATELETS: 324 10*3/uL (ref 150–400)
RBC: 3.36 MIL/uL — ABNORMAL LOW (ref 4.22–5.81)
RDW: 12.7 % (ref 11.5–15.5)
WBC: 9 10*3/uL (ref 4.0–10.5)

## 2017-05-05 MED ORDER — ENOXAPARIN SODIUM 40 MG/0.4ML ~~LOC~~ SOLN
40.0000 mg | SUBCUTANEOUS | Status: DC
  Administered 2017-05-05 – 2017-05-14 (×9): 40 mg via SUBCUTANEOUS
  Filled 2017-05-05 (×8): qty 0.4

## 2017-05-05 MED ORDER — PIPERACILLIN-TAZOBACTAM 3.375 G IVPB
3.3750 g | Freq: Three times a day (TID) | INTRAVENOUS | Status: DC
  Administered 2017-05-05 – 2017-05-06 (×4): 3.375 g via INTRAVENOUS
  Filled 2017-05-05 (×5): qty 50

## 2017-05-05 MED ORDER — MELATONIN 3 MG PO TABS
3.0000 mg | ORAL_TABLET | Freq: Every day | ORAL | Status: DC
  Administered 2017-05-05 – 2017-05-14 (×10): 3 mg via ORAL
  Filled 2017-05-05 (×12): qty 1

## 2017-05-05 MED ORDER — TRAMADOL HCL 50 MG PO TABS
50.0000 mg | ORAL_TABLET | Freq: Four times a day (QID) | ORAL | Status: DC | PRN
  Administered 2017-05-05 – 2017-05-13 (×15): 50 mg via ORAL
  Filled 2017-05-05 (×15): qty 1

## 2017-05-05 MED ORDER — SENNOSIDES-DOCUSATE SODIUM 8.6-50 MG PO TABS
1.0000 | ORAL_TABLET | Freq: Every evening | ORAL | Status: DC | PRN
Start: 2017-05-05 — End: 2017-05-06
  Administered 2017-05-05: 1 via ORAL
  Filled 2017-05-05: qty 1

## 2017-05-05 MED ORDER — VANCOMYCIN HCL IN DEXTROSE 1-5 GM/200ML-% IV SOLN
1000.0000 mg | Freq: Once | INTRAVENOUS | Status: AC
  Administered 2017-05-05: 1000 mg via INTRAVENOUS
  Filled 2017-05-05: qty 200

## 2017-05-05 MED ORDER — SODIUM CHLORIDE 0.9% FLUSH
3.0000 mL | Freq: Two times a day (BID) | INTRAVENOUS | Status: DC
  Administered 2017-05-07 – 2017-05-15 (×9): 3 mL via INTRAVENOUS

## 2017-05-05 MED ORDER — ACETAMINOPHEN 650 MG RE SUPP: 650.0000 mg | Freq: Four times a day (QID) | RECTAL | Status: DC | PRN

## 2017-05-05 MED ORDER — SODIUM CHLORIDE 0.9 % IV SOLN: 250.0000 mL | INTRAVENOUS | Status: DC | PRN

## 2017-05-05 MED ORDER — ASPIRIN EC 325 MG PO TBEC
325.0000 mg | DELAYED_RELEASE_TABLET | Freq: Every day | ORAL | Status: DC
  Administered 2017-05-06 – 2017-05-15 (×8): 325 mg via ORAL
  Filled 2017-05-05 (×9): qty 1

## 2017-05-05 MED ORDER — SODIUM CHLORIDE 0.9% FLUSH
3.0000 mL | Freq: Two times a day (BID) | INTRAVENOUS | Status: DC
  Administered 2017-05-05 – 2017-05-15 (×9): 3 mL via INTRAVENOUS

## 2017-05-05 MED ORDER — ACETAMINOPHEN 325 MG PO TABS
650.0000 mg | ORAL_TABLET | Freq: Four times a day (QID) | ORAL | Status: DC | PRN
  Filled 2017-05-05: qty 2

## 2017-05-05 MED ORDER — VANCOMYCIN HCL IN DEXTROSE 750-5 MG/150ML-% IV SOLN
750.0000 mg | Freq: Two times a day (BID) | INTRAVENOUS | Status: DC
  Administered 2017-05-06 – 2017-05-09 (×7): 750 mg via INTRAVENOUS
  Filled 2017-05-05 (×8): qty 150

## 2017-05-05 MED ORDER — SODIUM CHLORIDE 0.9% FLUSH: 3.0000 mL | INTRAVENOUS | Status: DC | PRN

## 2017-05-05 NOTE — H&P (Signed)
Darin Simmons [address]       [place] [address]             [phone]        Darin Simmons [facility] Medical Record #[id number] Date of Birth: [birth year]  Referring: No ref. provider found Primary Care: Darin Austin, MD  Chief Complaint:  Sternal Wound infection  History of Present Illness:     Darin Simmons is a 76 year old patient with a past medical history of coronary artery disease s/p DES to RCA and hypercholesterolemia who recently underwent an AVR on 04/22/2017 with Dr. Cyndia Simmons. He was sent home on 6/7 and had an unremarkable post-op course. He returned to the office today for a chest tube suture removal and was noticed to have some increased drainage from his sternal incision. The drainage started a day ago but was mostly clear, however this morning it turned yellow/green. He denies fevers, chills, and night sweats. He denies pain at the incision site. He was discharged on Keflex and took a full course (7 days). He is admitted today for workup of his sternal infection.    Current Activity/ Functional Status: Patient is independent with mobility/ambulation, transfers, ADL's, IADL's.   Zubrod Score: At the time of surgery this patient's most appropriate activity status/level should be described as: [x]     0    Normal activity, no symptoms []     1    Restricted in physical strenuous activity but ambulatory, able to do out light work []     2    Ambulatory and capable of self care, unable to do work activities, up and about                 more than 50%  Of the time                            []     3    Only limited self care, in bed greater than 50% of waking hours []     4    Completely disabled, no self care, confined to bed or chair []     5    Moribund  Past Medical History:  Diagnosis Date  . 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 Aortic stenosis   . Arthritis   . BPH (benign prostatic hyperplasia)   . CAD (coronary artery disease) 2004    . Dyspnea   . Heart attack ([facility]) 2004  . Heart murmur   . Hypercholesterolemia   . 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 PVD (peripheral vascular disease) ([facility])     Past Surgical History:  Procedure Laterality Date  . AORTIC VALVE REPLACEMENT N/A 04/22/2017   Procedure: AORTIC VALVE REPLACEMENT (AVR) using Magna Ease size 55mm;  Surgeon: Gaye Pollack, MD;  Location: [facility];  Service: Open Heart Surgery;  Laterality: N/A;  . DENTAL SURGERY     removed  . hair follicle removed Left   . heart stent    . HERNIA REPAIR Bilateral 0/62/69   BIH and umbilical hernia repair  . INGUINAL HERNIA REPAIR Bilateral 01/18/2013   Procedure: LAPAROSCOPIC BILATERAL INGUINAL HERNIA REPAIR;  Surgeon: Odis Hollingshead, MD;  Location: [facility];  Service: General;  Laterality: Bilateral;  . INSERTION OF MESH Bilateral 01/18/2013   Procedure: INSERTION OF MESH;  Surgeon: Odis Hollingshead, MD;  Location: [facility];  Service: General;  Laterality: Bilateral;  AND UMBILICUS  . TEE WITHOUT CARDIOVERSION N/A 04/22/2017   Procedure: TRANSESOPHAGEAL ECHOCARDIOGRAM (TEE);  Surgeon: Gaye Pollack, MD;  Location: [facility];  Service: Open Heart Surgery;  Laterality: N/A;  . UMBILICAL HERNIA REPAIR N/A 01/18/2013   Procedure: HERNIA REPAIR UMBILICAL ADULT;  Surgeon: Odis Hollingshead, MD;  Location: [facility];  Service: General;  Laterality: N/A;    History  Smoking Status  . Former Smoker  . Packs/day: 1.00  . Years: 50.00  . Types: Cigarettes  . Quit date: 11/22/2008  Smokeless Tobacco  . Never Used    History  Alcohol Use No    Social History   Social History  . Marital status: Divorced    Spouse name: N/A  . Number of children: N/A  . Years of education: N/A   Occupational History  . Not on file.   Social History Main Topics  . Smoking status: Former Smoker    Packs/day: 1.00    Years: 50.00    Types: Cigarettes    Quit date: 11/22/2008  . Smokeless tobacco: Never Used  . Alcohol use No  . Drug use: No  . Sexual  activity: Not on file   Other Topics Concern  . Not on file   Social History Narrative  . No narrative on file    Allergies  Allergen Reactions  . Bee Venom Shortness Of Breath and Swelling  . Crestor [Rosuvastatin] Other (See Comments)    MYALGIAS  . Diphenhydramine Hcl Other (See Comments)    Shaky legs  . Statins Other (See Comments)    MYALGIAS MUSCLE PAIN    Current Facility-Administered Medications  Medication Dose Route Frequency Provider Last Rate Last Dose  . 0.9 %  sodium chloride infusion  250 mL Intravenous PRN Conte, Tessa N, PA-C      . acetaminophen (TYLENOL) tablet 650 mg  650 mg Oral Q6H PRN Harriet Pho, Tessa N, PA-C       Or  . acetaminophen (TYLENOL) suppository 650 mg  650 mg Rectal Q6H PRN Harriet Pho, Tessa N, PA-C      . aspirin EC tablet 325 mg  325 mg Oral Daily Conte, Tessa N, PA-C      . enoxaparin (LOVENOX) injection 40 mg  40 mg Subcutaneous Q24H Conte, Tessa N, [place]      . Melatonin TABS 3 mg  3 mg Oral QHS Conte, Tessa N, PA-C      . senna-docusate (Senokot-S) tablet 1 tablet  1 tablet Oral QHS PRN Harriet Pho, Tessa N, PA-C      . sodium chloride flush (NS) 0.9 % injection 3 mL  3 mL Intravenous Q12H Conte, Tessa N, PA-C      . sodium chloride flush (NS) 0.9 % injection 3 mL  3 mL Intravenous Q12H Conte, Tessa N, PA-C      . sodium chloride flush (NS) 0.9 % injection 3 mL  3 mL Intravenous PRN Harriet Pho, Tessa N, PA-C        Prescriptions Prior to Admission  Medication Sig Dispense Refill Last Dose  . aspirin EC 325 MG tablet Take 1 tablet (325 mg total) by mouth daily.   05/05/2017 at Unknown time  . Hypromellose (ARTIFICIAL TEARS OP) Place 1 drop into both eyes as needed (for dry eyes).    Past Week at Unknown time  . pravastatin (PRAVACHOL) 20 MG tablet Take 20 mg by mouth at bedtime.    04/21/2017 at Unknown time  . traMADol (ULTRAM) 50  MG tablet Take 50 mg by mouth every 4-6 hours PRN moderate pain. (Patient taking differently: Take 50 mg by mouth every 4 (four)  hours as needed for moderate pain. ) 30 tablet 0 05/05/2017 at Unknown time  . cephALEXin (KEFLEX) 500 MG capsule Take 1 capsule (500 mg total) by mouth every 6 (six) hours. (Patient not taking: Reported on 05/05/2017) 19 capsule 0 Completed Course at Unknown time  . Magnesium 250 MG TABS Take 1 tablet (250 mg total) by mouth daily. (Patient not taking: Reported on 05/05/2017)  0 Not Taking at Unknown time  . Melatonin 3 MG TABS Take 3 mg by mouth at bedtime.   Taking    Family History  Problem Relation Age of Onset  . Heart disease Father      Review of Systems:  Pertinent items are noted in HPI.     Cardiac Review of Systems: Y or N  Chest Pain [  N  ]  Resting SOB [ N  ] Exertional SOB  [ N ]  Orthopnea [  ]   Pedal Edema [ Y  ]    Palpitations [  ] Syncope  [  ]   Presyncope [   ]  General Review of Systems: [Y] = yes [  ]=no Constitional: recent weight change [ N ]; anorexia [  ]; fatigue [  ]; nausea [  ]; night sweats [ N ]; fever [ N ]; or chills [ N ]                                                               Dental: poor dentition[ Y ]; Last Dentist visit:   Eye : blurred vision [  ]; diplopia [   ]; vision changes [ N ];  Amaurosis fugax[  ]; Resp: cough [ N ];  wheezing[  ];  hemoptysis[  ]; shortness of breath[N  ]; paroxysmal nocturnal dyspnea[ N ]; dyspnea on exertion[  N]; or orthopnea[  ];  GI:  gallstones[  ], vomiting[ N ];  dysphagia[  ]; melena[  ];  hematochezia [  ]; heartburn[  ];   Hx of  Colonoscopy[  ]; GU: kidney stones [  ]; hematuria[  ];   dysuria [  ];  nocturia[  ];  history of obstruction [N  ]; urinary frequency [Y ]             Skin: rash, swelling[  ];, hair loss[  ];  peripheral edema[ Y ];  or itching[ N ]; Musculosketetal: myalgias[ N ];  joint swelling[  ];  joint erythema[  ];  joint pain[  ];  back pain[  ];  Heme/Lymph: bruising[  ];  bleeding[  ];  anemia[  ];  Neuro: TIA[  ];  headaches[N  ];  stroke[  ];  vertigo[  ];  seizures[  ];    paresthesias[  ];  difficulty walking[  ];  Psych:depression[N  ]; Jetty Peeks  ];  Endocrine: diabetes[ N ];  thyroid dysfunction[ N ];  Immunizations: Flu [  ]; Pneumococcal[  ];  Other:  Physical Exam: BP (!) 125/57 (BP Location: Left Arm)   Pulse 66   Temp 98.1 F (36.7 C) (Oral)   Resp 16   Wt  65.3 kg (144 lb)   SpO2 100%   BMI 20.66 kg/m    General appearance: alert, cooperative and no distress Resp: clear to auscultation bilaterally Cardio: regular rate and rhythm, S1, S2 normal, no murmur, click, rub or gallop GI: soft, non-tender; bowel sounds normal; no masses,  no organomegaly Extremities: 1+ pitting pedal edema bilaterally  Wound: small amount of drainage from the middle of the sternal incision. Appears clear at this time. The incision was painted with betadine and dressed a few minutes before my interview.   Diagnostic Studies & Laboratory data: None to review.       Recent Lab Findings: Lab Results  Component Value Date   WBC 8.2 04/28/2017   HGB 9.8 (L) 04/28/2017   HCT 29.2 (L) 04/28/2017   PLT 75 (L) 04/28/2017   GLUCOSE 93 04/25/2017   CHOL 178 04/06/2017   TRIG 115 04/06/2017   HDL 47 04/06/2017   LDLCALC 108 (H) 04/06/2017   ALT 20 04/20/2017   AST 31 04/20/2017   NA 139 04/25/2017   K 3.7 04/25/2017   CL 107 04/25/2017   CREATININE 0.63 04/25/2017   BUN 11 04/25/2017   CO2 26 04/25/2017   INR 1.47 04/22/2017   HGBA1C 4.9 04/20/2017      Assessment / Plan:   We have admitted Mr. Fitzhenry for evaluation and treatment of a possible sternal infection. CXR, blood cultures, and labs pending. Empiric IV antibiotics have been started.    I  spent 30 minutes counseling the patient face to face and 50% or more the  time was spent in counseling and coordination of care. The total time spent in the appointment was 60 minutes.    Darin Rough, PA-C 05/05/2017 12:23 PM    Chart reviewed, patient examined, agree with above. He had an uneventful AVR  surgery but did have some serous drainage from his incision a few days postop. The drainage stopped and he was sent home on Keflex. Returned today for suture removal reporting a couple days of drainage from the upper part of the incisions. No fever or chills. He has cellulitis of the upper portion of the incision and the place that was draining when he was in the hospital is draining again. It was reportedly yellow to green in the office today when he saw Dr. Servando Snare. Now there is just some serosanguinous drainage. The sternum feels stable and I don't think that this tracks down to sternum. He is afebrile and WBC normal. GS from drainage shows abundant PMN's but no organisms.  I think this is a superficial sternal wound infection. Will start broad spectrum antibiotics pending culture results.

## 2017-05-05 NOTE — Addendum Note (Signed)
Addended by: Lanelle Bal B on: 05/05/2017 10:27 AM   Modules accepted: Level of Service

## 2017-05-05 NOTE — Telephone Encounter (Signed)
Darin Simmons with [facility] called with STAT Gram Stain results: (Abundant WBC, negative for organisms))

## 2017-05-05 NOTE — Progress Notes (Addendum)
Pharmacy Antibiotic Note  SANDIP POWER is a 76 y.o. male admitted on 05/05/2017 with wound infection.  Pharmacy has been consulted for vancomycin and zosyn dosing.  Plan: Vancomycin 750mg  IV every 12 hours.  Goal trough 15-20 mcg/mL. Zosyn 3.375g IV q8h (4 hour infusion).  Monitor culture data, renal function and clinical course VT at SS prn  Weight: 144 lb (65.3 kg)  Temp (24hrs), Avg:92.9 F (33.8 C), Min:87.7 F (30.9 C), Max:98.1 F (36.7 C)   Recent Labs Lab 05/05/17 1244  WBC 9.0  CREATININE 0.65    Estimated Creatinine Clearance: 76 mL/min (by C-G formula based on SCr of 0.65 mg/dL).    Allergies  Allergen Reactions  . Bee Venom Shortness Of Breath and Swelling  . Crestor [Rosuvastatin] Other (See Comments)    MYALGIAS  . Diphenhydramine Hcl Other (See Comments)    Shaky legs  . Statins Other (See Comments)    MYALGIAS MUSCLE PAIN     Andrey Cota. Diona Foley, PharmD, BCPS Clinical Pharmacist [phone] 05/05/2017 12:52 PM

## 2017-05-05 NOTE — Progress Notes (Signed)
[address]       [place] [address]             [facility] Record #[id number] Date of Birth: 08/28/1941  Referring: Jettie Booze, MD Primary Care: Darcus Austin, MD  Chief Complaint:   POST OP FOLLOW UP Procedure:  1. Median Sternotomy 2. Extracorporeal circulation 3.   Aortic valve replacement using a 25 mm Edwards Magna-Ease pericardial valve.  History of Present Illness:     Patient comes to the office today for chest tube suture removal. He notes that he had been discussing with cardiology office drainage from incision and redness. He denies any fever or chills. Overall he feels that he has been gaining strength.     Past Medical History:  Diagnosis Date  . 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 Aortic stenosis   . Arthritis   . BPH (benign prostatic hyperplasia)   . CAD (coronary artery disease) 2004  . Dyspnea   . Heart attack ([facility]) 2004  . Heart murmur   . Hypercholesterolemia   . 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 PVD (peripheral vascular disease) ([facility])      History  Smoking Status  . Former Smoker  . Packs/day: 1.00  . Years: 50.00  . Types: Cigarettes  . Quit date: 11/22/2008  Smokeless Tobacco  . Never Used    History  Alcohol Use No     Allergies  Allergen Reactions  . Bee Venom Shortness Of Breath and Swelling  . Crestor [Rosuvastatin] Other (See Comments)    MYALGIAS  . Diphenhydramine Hcl Other (See Comments)    Shaky legs  . Statins Other (See Comments)    MYALGIAS MUSCLE PAIN    Current Outpatient Prescriptions  Medication Sig Dispense Refill  . ASHWAGANDHA PO Take 800 mg by mouth daily.    Marland Kitchen aspirin EC 325 MG tablet Take 1 tablet (325 mg total) by mouth daily.    . B Complex Vitamins (B COMPLEX 100 PO) Take 100 mg by mouth daily.    . cephALEXin (KEFLEX) 500 MG capsule Take 1 capsule (500 mg total) by mouth every 6 (six) hours. 19 capsule 0  .  cholecalciferol (VITAMIN D) 400 UNITS TABS Take 400 Units by mouth daily.    . Coenzyme Q10 (CO Q 10) 100 MG CAPS Take 100 mg by mouth daily.    . Hypromellose (ARTIFICIAL TEARS OP) Place 1 drop into both eyes at bedtime.    . Magnesium 250 MG TABS Take 1 tablet (250 mg total) by mouth daily.  0  . Melatonin 3 MG TABS Take 3 mg by mouth at bedtime.    . Nutritional Supplements (CHLORELLA-SPIRULINA COMPLEX) TABS Take 1 tablet by mouth daily.    . Potassium 99 MG TABS Take 99 mg by mouth daily.    . pravastatin (PRAVACHOL) 20 MG tablet Take 20 mg by mouth at bedtime.     . Saw Palmetto 450 MG CAPS Take 900 mg by mouth 2 (two) times a week.    . Selenium 200 MCG CAPS Take 200 mcg by mouth.    . traMADol (ULTRAM) 50 MG tablet Take 50 mg by mouth every 4-6 hours PRN moderate pain. 30 tablet 0  . Turmeric Curcumin 500 MG CAPS Take 500 mg by mouth daily.    . vitamin C (ASCORBIC ACID) 500 MG tablet Take  500 mg by mouth daily.     . vitamin E 400 UNIT capsule Take 400 Units by mouth daily.    Marland Kitchen zinc gluconate 50 MG tablet Take 50 mg by mouth daily.     No current facility-administered medications for this visit.        Physical Exam: BP 130/68 (BP Location: Right Arm, Patient Position: Sitting, Cuff Size: Normal)   Temp (!) 87.7 F (30.9 C) (Oral)   Resp 16   Ht 5\' 10"  (1.778 m)   Wt 144 lb (65.3 kg)   SpO2 95% Comment: ON RA  BMI 20.66 kg/m   General appearance: alert and cooperative Neurologic: intact Heart: regular rate and rhythm, S1, S2 normal, no murmur, click, rub or gallop Lungs: diminished breath sounds bibasilar Abdomen: soft, non-tender; bowel sounds normal; no masses,  no organomegaly Extremities: extremities normal, atraumatic, no cyanosis or edema and Homans sign is negative, no sign of DVT Wound: Sternum is without movement but has diffuse cellulitis along the incision and small amount of serous drainage.     Diagnostic Studies & Laboratory data:     Recent  Radiology Findings:   No results found.    Recent Lab Findings: Lab Results  Component Value Date   WBC 8.2 04/28/2017   HGB 9.8 (L) 04/28/2017   HCT 29.2 (L) 04/28/2017   PLT 75 (L) 04/28/2017   GLUCOSE 93 04/25/2017   CHOL 178 04/06/2017   TRIG 115 04/06/2017   HDL 47 04/06/2017   LDLCALC 108 (H) 04/06/2017   ALT 20 04/20/2017   AST 31 04/20/2017   NA 139 04/25/2017   K 3.7 04/25/2017   CL 107 04/25/2017   CREATININE 0.63 04/25/2017   BUN 11 04/25/2017   CO2 26 04/25/2017   INR 1.47 04/22/2017   HGBA1C 4.9 04/20/2017      Assessment / Plan:      Sternal wound infection, with the appearance of the wound, new implantation of aortic valve, we'll have the patient admitted, blood cultures done, and started on IV antibiotics.   Grace Isaac MD      [address] [place] [address] Office [phone]   Beeper [phone]  05/05/2017 10:24 AM

## 2017-05-06 DIAGNOSIS — L089 Local infection of the skin and subcutaneous tissue, unspecified: Secondary | ICD-10-CM

## 2017-05-06 LAB — HEMOGLOBIN A1C
HEMOGLOBIN A1C: 5.1 % (ref 4.8–5.6)
MEAN PLASMA GLUCOSE: 100 mg/dL

## 2017-05-06 LAB — BLOOD CULTURE ID PANEL (REFLEXED)
Acinetobacter baumannii: NOT DETECTED
CANDIDA KRUSEI: NOT DETECTED
CANDIDA PARAPSILOSIS: NOT DETECTED
Candida albicans: NOT DETECTED
Candida glabrata: NOT DETECTED
Candida tropicalis: NOT DETECTED
ENTEROCOCCUS SPECIES: NOT DETECTED
ESCHERICHIA COLI: NOT DETECTED
Enterobacter cloacae complex: NOT DETECTED
Enterobacteriaceae species: NOT DETECTED
HAEMOPHILUS INFLUENZAE: NOT DETECTED
KLEBSIELLA OXYTOCA: NOT DETECTED
Klebsiella pneumoniae: NOT DETECTED
Listeria monocytogenes: NOT DETECTED
Methicillin resistance: DETECTED — AB
Neisseria meningitidis: NOT DETECTED
PROTEUS SPECIES: NOT DETECTED
Pseudomonas aeruginosa: NOT DETECTED
SERRATIA MARCESCENS: NOT DETECTED
STAPHYLOCOCCUS SPECIES: DETECTED — AB
Staphylococcus aureus (BCID): NOT DETECTED
Streptococcus agalactiae: NOT DETECTED
Streptococcus pneumoniae: NOT DETECTED
Streptococcus pyogenes: NOT DETECTED
Streptococcus species: NOT DETECTED

## 2017-05-06 MED ORDER — PIPERACILLIN-TAZOBACTAM 3.375 G IVPB
3.3750 g | Freq: Three times a day (TID) | INTRAVENOUS | Status: DC
  Filled 2017-05-06 (×2): qty 50

## 2017-05-06 MED ORDER — DOCUSATE SODIUM 100 MG PO CAPS
100.0000 mg | ORAL_CAPSULE | Freq: Every day | ORAL | Status: DC
  Administered 2017-05-06 – 2017-05-14 (×9): 100 mg via ORAL
  Filled 2017-05-06 (×9): qty 1

## 2017-05-06 MED ORDER — SENNOSIDES-DOCUSATE SODIUM 8.6-50 MG PO TABS
1.0000 | ORAL_TABLET | Freq: Every day | ORAL | Status: DC
  Administered 2017-05-06 – 2017-05-07 (×2): 1 via ORAL
  Filled 2017-05-06 (×2): qty 1

## 2017-05-06 MED ORDER — DEXTROSE 5 % IV SOLN
1.0000 g | INTRAVENOUS | Status: DC
  Administered 2017-05-06 – 2017-05-09 (×4): 1 g via INTRAVENOUS
  Filled 2017-05-06 (×4): qty 10

## 2017-05-06 MED ORDER — PIPERACILLIN-TAZOBACTAM 3.375 G IVPB
3.3750 g | Freq: Three times a day (TID) | INTRAVENOUS | Status: DC
Start: 2017-05-06 — End: 2017-05-06
  Filled 2017-05-06: qty 50

## 2017-05-06 NOTE — Consult Note (Signed)
[facility]         Reason for Consult: Sternal wound infection, +blood cx    Referring Physician: Dr. Cyndia Bent    HPI: Darin Simmons is a 76 y.o. 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   Patient reports noticing clear drainage from his sternal surgical wound about 2 days ago. He initially thought it was just perspiration but then realized his wound was draining. He denies any purulent drainage. He reports redness around the wound but feels this was present ever since his surgery. He is unable to determine if the redness has been worsening. He denies any swelling or tenderness over the area. He denies any fevers or chills at home. He states he was worried and called EMS 2 days ago. EMS evaluated him and recommended that he follow up with his doctor.   He was evaluated by CT surgery in the office on 6/14 for a scheduled chest tube suture removal. It was felt that the patient had developed cellulitis along the incision and noted to have a small amount of serous drainage. Patient was then admitted for IV antibiotics. Blood cx were drawn with 1 out of 2 growing methicillin resistant coagulase negative staph. Wound cx without growth. He was placed on Vanc/Zosyn.  Past Medical History:  Diagnosis Date  . 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 Aortic stenosis   . Arthritis   . BPH (benign prostatic hyperplasia)   . CAD (coronary artery disease) 2004  . Dyspnea   . Heart attack ([place]) 2004  . Heart murmur   . Hypercholesterolemia   . 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 PVD (peripheral vascular disease) ([place])   . Wound infection 04/2017    Allergies:  Allergies  Allergen Reactions  . Bee Venom Shortness Of Breath and Swelling  . Crestor [Rosuvastatin] Other (See Comments)    MYALGIAS  . Diphenhydramine Hcl Other (See Comments)    Shaky  legs  . Statins Other (See Comments)    MYALGIAS MUSCLE PAIN    Current antibiotics:   MEDICATIONS: . aspirin EC  325 mg Oral Daily  . docusate sodium  100 mg Oral QHS  . enoxaparin (LOVENOX) injection  40 mg Subcutaneous Q24H  . Melatonin  3 mg Oral QHS  . senna-docusate  1 tablet Oral Q1500  . sodium chloride flush  3 mL Intravenous Q12H  . sodium chloride flush  3 mL Intravenous Q12H    Social History  Substance Use Topics  . Smoking status: Former Smoker    Packs/day: 1.00    Years: 50.00    Types: Cigarettes    Quit date: 11/22/2008  . Smokeless tobacco: Never Used  . Alcohol use No    Family History  Problem Relation Age of Onset  . Heart disease Father     Review of Systems -  All negative except per HPI   OBJECTIVE: Temp:  [97.7 F (36.5 C)] 97.7 F (36.5 C) (06/15 0443) Pulse Rate:  [64] 64 (06/15 0443) Resp:  [18] 18 (06/15 0443) BP: (128)/(57) 128/57 (06/15 0443) SpO2:  [100 %] 100 % (06/15 0443)  General: Well-nourished elderly man sitting up in bed, NAD HEENT: [place]/AT, EOMI, sclera anicteric, mucus membranes moist  CV: RRR, no m/g/r Pulm: CTA bilaterally, breaths non-labored Abd: BS+, soft, non-tender Ext: warm,  1+ peripheral edema bilaterally Neuro: alert and oriented x 3. Strength intact. Skin: Surgical incision site on sternum is covered with a clean bandage. Removal of the bandage reveals serosanguinous drainage, no purulence. There is mild erythema surrounding the incision site. No tenderness to palpation. There is some crusting of the wound, particularly at the top.  LABS: Results for orders placed or performed during the hospital encounter of 05/05/17 (from the past 48 hour(s))  CBC     Status: Abnormal   Collection Time: 05/05/17 12:44 PM  Result Value Ref Range   WBC 9.0 4.0 - 10.5 K/uL   RBC 3.36 (L) 4.22 - 5.81 MIL/uL   Hemoglobin 10.1 (L) 13.0 - 17.0 g/dL   HCT 30.6 (L) 39.0 - 52.0 %   MCV 91.1 78.0 - 100.0 fL   MCH 30.1 26.0 -  34.0 pg   MCHC 33.0 30.0 - 36.0 g/dL   RDW 12.7 11.5 - 15.5 %   Platelets 324 150 - 400 K/uL  Basic metabolic panel     Status: Abnormal   Collection Time: 05/05/17 12:44 PM  Result Value Ref Range   Sodium 137 135 - 145 mmol/L   Potassium 4.4 3.5 - 5.1 mmol/L   Chloride 104 101 - 111 mmol/L   CO2 21 (L) 22 - 32 mmol/L   Glucose, Bld 88 65 - 99 mg/dL   BUN 11 6 - 20 mg/dL   Creatinine, Ser 0.65 0.61 - 1.24 mg/dL   Calcium 8.7 (L) 8.9 - 10.3 mg/dL   GFR calc non Af Amer >60 >60 mL/min   GFR calc Af Amer >60 >60 mL/min    Comment: (NOTE) The eGFR has been calculated using the CKD EPI equation. This calculation has not been validated in all clinical situations. eGFR's persistently <60 mL/min signify possible Chronic Kidney Disease.    Anion gap 12 5 - 15  Hemoglobin A1c     Status: None   Collection Time: 05/05/17 12:44 PM  Result Value Ref Range   Hgb A1c MFr Bld 5.1 4.8 - 5.6 %    Comment: (NOTE)         Pre-diabetes: 5.7 - 6.4         Diabetes: >6.4         Glycemic control for adults with diabetes: <7.0    Mean Plasma Glucose 100 mg/dL    Comment: (NOTE) Performed At: [facility] [address] [place], [place] [id number] Lindon Romp MD ZG:[id number]   Culture, blood (Routine X 2) w Reflex to ID Panel     Status: None (Preliminary result)   Collection Time: 05/05/17 12:44 PM  Result Value Ref Range   Specimen Description BLOOD LEFT HAND    Special Requests IN PEDIATRIC BOTTLE Blood Culture adequate volume    Culture NO GROWTH < 24 HOURS    Report Status PENDING   Culture, blood (Routine X 2) w Reflex to ID Panel     Status: None (Preliminary result)   Collection Time: 05/05/17 12:44 PM  Result Value Ref Range   Specimen Description BLOOD RIGHT HAND    Special Requests IN PEDIATRIC BOTTLE Blood Culture adequate volume    Culture  Setup Time      GRAM POSITIVE COCCI IN CLUSTERS IN PEDIATRIC BOTTLE CRITICAL RESULT CALLED TO, READ BACK BY AND VERIFIED  WITH: E MARTIN,PHARMD AT 1103 05/06/17 BY L BENFIELD    Culture GRAM POSITIVE COCCI    Report Status PENDING   Blood Culture ID  Panel (Reflexed)     Status: Abnormal   Collection Time: 05/05/17 12:44 PM  Result Value Ref Range   Enterococcus species NOT DETECTED NOT DETECTED   Listeria monocytogenes NOT DETECTED NOT DETECTED   Staphylococcus species DETECTED (A) NOT DETECTED    Comment: Methicillin (oxacillin) resistant coagulase negative staphylococcus. Possible blood culture contaminant (unless isolated from more than one blood culture draw or clinical case suggests pathogenicity). No antibiotic treatment is indicated for blood  culture contaminants. CRITICAL RESULT CALLED TO, READ BACK BY AND VERIFIED WITH: E MARTIN,PHARMD AT 1103 05/06/17 BY L BENFIELD    Staphylococcus aureus NOT DETECTED NOT DETECTED   Methicillin resistance DETECTED (A) NOT DETECTED    Comment: CRITICAL RESULT CALLED TO, READ BACK BY AND VERIFIED WITH: E MARTIN,PHARMD AT 1103 05/06/17 BY L BENFIELD    Streptococcus species NOT DETECTED NOT DETECTED   Streptococcus agalactiae NOT DETECTED NOT DETECTED   Streptococcus pneumoniae NOT DETECTED NOT DETECTED   Streptococcus pyogenes NOT DETECTED NOT DETECTED   Acinetobacter baumannii NOT DETECTED NOT DETECTED   Enterobacteriaceae species NOT DETECTED NOT DETECTED   Enterobacter cloacae complex NOT DETECTED NOT DETECTED   Escherichia coli NOT DETECTED NOT DETECTED   Klebsiella oxytoca NOT DETECTED NOT DETECTED   Klebsiella pneumoniae NOT DETECTED NOT DETECTED   Proteus species NOT DETECTED NOT DETECTED   Serratia marcescens NOT DETECTED NOT DETECTED   Haemophilus influenzae NOT DETECTED NOT DETECTED   Neisseria meningitidis NOT DETECTED NOT DETECTED   Pseudomonas aeruginosa NOT DETECTED NOT DETECTED   Candida albicans NOT DETECTED NOT DETECTED   Candida glabrata NOT DETECTED NOT DETECTED   Candida krusei NOT DETECTED NOT DETECTED   Candida parapsilosis NOT DETECTED  NOT DETECTED   Candida tropicalis NOT DETECTED NOT DETECTED    MICRO: 6/14 Blood cx (1 out of 2) >> methicillin resistant coagulase negative staph  IMAGING: Dg Chest Port 1 View  Result Date: 05/05/2017 CLINICAL DATA:  Recent CABG.  Wound cellulitis. EXAM: PORTABLE CHEST 1 VIEW COMPARISON:  04/25/2017. FINDINGS: Cardiomegaly.  Median sternotomy.  CABG and aortic valve surgery. No active infiltrates or failure. No effusion or pneumothorax. No acute osseous findings. IMPRESSION: Improved aeration.  Cardiomegaly. Electronically Signed   By: Staci Righter M.D.   On: 05/05/2017 13:43    Assessment/Plan:    Sternal Wound Infection: Patient likely has a cellulitis but given his recent valve replacement would like to be extra cautious and treat for more of a wound infection. Advise to continue Vancomycin and add on Ceftriaxone for gram negative coverage.   Positive blood cx: Blood cx from 6/14 (1 out of 2) with methicillin resistant coagulase negative staph. This most likely represents a contaminant. Will repeat blood cx to ensure this organism does not grow again.    Thank you for this interesting consult! Attending note to follow.   Albin Felling, MD, MPH Internal Medicine Resident, PGY-3 Pager: [phone]

## 2017-05-06 NOTE — Progress Notes (Signed)
  Subjective:  No complaints  Objective: Vital signs in last 24 hours: Temp:  [97.7 F (36.5 C)] 97.7 F (36.5 C) (06/15 0443) Pulse Rate:  [64] 64 (06/15 0443) Cardiac Rhythm: Normal sinus rhythm (06/15 0700) Resp:  [18] 18 (06/15 0443) BP: (128)/(57) 128/57 (06/15 0443) SpO2:  [100 %] 100 % (06/15 0443)  Hemodynamic parameters for last 24 hours:    Intake/Output from previous day: 06/14 0701 - 06/15 0700 In: 490 [P.O.:240; IV Piggyback:250] Out: -  Intake/Output this shift: No intake/output data recorded.  General appearance: alert and cooperative Heart: regular rate and rhythm, S1, S2 normal, no murmur, click, rub or gallop Lungs: clear to auscultation bilaterally Wound: chest incision has one small opening that is draining serous fluid that looks like fat necrosis. Minimal if any erythema.  Lab Results:  Recent Labs  05/05/17 1244  WBC 9.0  HGB 10.1*  HCT 30.6*  PLT 324   BMET:  Recent Labs  05/05/17 1244  NA 137  K 4.4  CL 104  CO2 21*  GLUCOSE 88  BUN 11  CREATININE 0.65  CALCIUM 8.7*    PT/INR: No results for input(s): LABPROT, INR in the last 72 hours. ABG    Component Value Date/Time   PHART 7.352 04/22/2017 1723   HCO3 21.3 04/22/2017 1723   TCO2 26 04/23/2017 1716   ACIDBASEDEF 4.0 (H) 04/22/2017 1723   O2SAT 98.0 04/22/2017 1723   CBG (last 3)  No results for input(s): GLUCAP in the last 72 hours.  Assessment/Plan:  Superficial sternal wound infection AVR with bioprosthetic valve. Gram stain of wound culture showed abundant WBC but no organisms and culture pending. The patient has a positive blood culture for Meth-resistant coag neg Staph which I think is probably a contaminant. He remains afebrile with no leukocytosis and feels fine. Will continue dressing changes to wound as long as it is draining and IV vanc/Zosyn pending final cultures. I have consulted ID and Dr. Linus Salmons will see him to help with decision on antibiotic treatment. I  don't think the wound needs to be opened up at this time.   LOS: 1 day    Gaye Pollack 05/06/2017

## 2017-05-06 NOTE — Progress Notes (Signed)
  PHARMACY - PHYSICIAN COMMUNICATION CRITICAL VALUE ALERT - BLOOD CULTURE IDENTIFICATION (BCID)  Results for orders placed or performed during the hospital encounter of 05/05/17  Blood Culture ID Panel (Reflexed) (Collected: 05/05/2017 12:44 PM)  Result Value Ref Range   Enterococcus species NOT DETECTED NOT DETECTED   Listeria monocytogenes NOT DETECTED NOT DETECTED   Staphylococcus species DETECTED (A) NOT DETECTED   Staphylococcus aureus NOT DETECTED NOT DETECTED   Methicillin resistance DETECTED (A) NOT DETECTED   Streptococcus species NOT DETECTED NOT DETECTED   Streptococcus agalactiae NOT DETECTED NOT DETECTED   Streptococcus pneumoniae NOT DETECTED NOT DETECTED   Streptococcus pyogenes NOT DETECTED NOT DETECTED   Acinetobacter baumannii NOT DETECTED NOT DETECTED   Enterobacteriaceae species NOT DETECTED NOT DETECTED   Enterobacter cloacae complex NOT DETECTED NOT DETECTED   Escherichia coli NOT DETECTED NOT DETECTED   Klebsiella oxytoca NOT DETECTED NOT DETECTED   Klebsiella pneumoniae NOT DETECTED NOT DETECTED   Proteus species NOT DETECTED NOT DETECTED   Serratia marcescens NOT DETECTED NOT DETECTED   Haemophilus influenzae NOT DETECTED NOT DETECTED   Neisseria meningitidis NOT DETECTED NOT DETECTED   Pseudomonas aeruginosa NOT DETECTED NOT DETECTED   Candida albicans NOT DETECTED NOT DETECTED   Candida glabrata NOT DETECTED NOT DETECTED   Candida krusei NOT DETECTED NOT DETECTED   Candida parapsilosis NOT DETECTED NOT DETECTED   Candida tropicalis NOT DETECTED NOT DETECTED    Name of physician (or Provider) Contacted: Ellwood Handler, PA  Changes to prescribed antibiotics required: None - on Vancomycin + Zosyn for empiric coverage of sternal wound infection  Lawson Radar 05/06/2017  11:07 AM

## 2017-05-07 LAB — WOUND CULTURE
GRAM STAIN: NONE SEEN
Gram Stain: 336
Gram Stain: NONE SEEN

## 2017-05-07 NOTE — Progress Notes (Addendum)
      [address]       [place] [address]             [phone]      Subjective:  No complaints.  Feels pretty good.  Ambulating independently around hallway  Objective: Vital signs in last 24 hours: Temp:  [97.8 F (36.6 C)-98.3 F (36.8 C)] 97.8 F (36.6 C) (06/16 0446) Pulse Rate:  [60-65] 60 (06/16 0446) Cardiac Rhythm: Sinus bradycardia (06/16 0700) Resp:  [18] 18 (06/16 0446) BP: (111-113)/(53-55) 111/55 (06/16 0446) SpO2:  [97 %-100 %] 100 % (06/16 0446)  Intake/Output from previous day: 06/15 0701 - 06/16 0700 In: 130 [I.V.:80; IV Piggyback:50] Out: -   General appearance: alert, cooperative and no distress Heart: regular rate and rhythm Lungs: clear to auscultation bilaterally Abdomen: soft, non-tender; bowel sounds normal; no masses,  no organomegaly Extremities: extremities normal, atraumatic, no cyanosis or edema Wound: + purulence from mid sternotomy, no erythema present  Lab Results:  Recent Labs  05/05/17 1244  WBC 9.0  HGB 10.1*  HCT 30.6*  PLT 324   BMET:  Recent Labs  05/05/17 1244  NA 137  K 4.4  CL 104  CO2 21*  GLUCOSE 88  BUN 11  CREATININE 0.65  CALCIUM 8.7*    PT/INR: No results for input(s): LABPROT, INR in the last 72 hours. ABG    Component Value Date/Time   PHART 7.352 04/22/2017 1723   HCO3 21.3 04/22/2017 1723   TCO2 26 04/23/2017 1716   ACIDBASEDEF 4.0 (H) 04/22/2017 1723   O2SAT 98.0 04/22/2017 1723   CBG (last 3)  No results for input(s): GLUCAP in the last 72 hours.  Assessment/Plan:  1. CV- hemodynamically stable 2. ID- appreciate ID input, continue IV ABX for now... Repeat blood cultures pending... Patient remains afebrile 3. Dispo- continue current care   LOS: 2 days    BARRETT, ERIN 05/07/2017 Patient seen and examined, agree with above Continue vanco and ceftriaxone for now  [place]. Roxan Hockey, MD [facility] [phone]

## 2017-05-07 NOTE — Progress Notes (Signed)
    [facility]   Reason for visit: Follow up on wound infection   Interval History: blood cultures sent, ngtd, on vancomycin day 2, ceftriaxone  Physical Exam: Constitutional:  Vitals:   05/06/17 2100 05/07/17 0446  BP: (!) 113/53 (!) 111/55  Pulse: 65 60  Resp: 18 18  Temp: 98.3 F (36.8 C) 97.8 F (36.6 C)  not in room  Impression: stable wound  Plan: 1.  Repeat cultures negative.  I will continue vancomycin for now but if no growth in the next 1-2 days will stop.  On ceftriaxone for wound.  Can use Keflex or Augmentin at discharge.

## 2017-05-08 LAB — CULTURE, BLOOD (ROUTINE X 2): SPECIAL REQUESTS: ADEQUATE

## 2017-05-08 LAB — VANCOMYCIN, TROUGH: VANCOMYCIN TR: 13 ug/mL — AB (ref 15–20)

## 2017-05-08 MED ORDER — SENNOSIDES-DOCUSATE SODIUM 8.6-50 MG PO TABS
1.0000 | ORAL_TABLET | Freq: Two times a day (BID) | ORAL | Status: DC
  Administered 2017-05-08 – 2017-05-15 (×13): 1 via ORAL
  Filled 2017-05-08 (×14): qty 1

## 2017-05-08 MED ORDER — PSYLLIUM 95 % PO PACK
1.0000 | PACK | ORAL | Status: DC | PRN
  Administered 2017-05-08: 1 via ORAL
  Filled 2017-05-08: qty 1

## 2017-05-08 NOTE — Progress Notes (Signed)
    [facility]   Reason for visit: Follow up on wound infection   Interval History: blood cultures sent, ngtd, on vancomycin day 3, ceftriaxone; culture from wound with Serratia, cefazolin resistant.    Physical Exam: Constitutional:  Vitals:   05/07/17 2126 05/08/17 0511  BP: 117/60 (!) 126/56  Pulse: 70 (!) 56  Resp: 16 16  Temp: 97.8 F (36.6 C) 98.3 F (36.8 C)  alert and nad Cardiovascular: RRR Pulmonary: CTA B, normal respiratory effort GI: soft, nt Chest: sternal wound with small area and mild purulence open at the lower end  Impression: stable wound; negative blood cultures  Plan: 1.  Repeat cultures negative.  Continue vancomycin for now but if no growth by tomorrow, ok to stop vancomycin.    2.  Wound infection - growing Serratia and sensitive to ceftriaxone.  Will continue this for now and at discharge, can take Bactrim DS tab twice a day for about 7 more days.    Dr.Snider on tomorrow

## 2017-05-08 NOTE — Progress Notes (Signed)
ANTIBIOTIC CONSULT NOTE   Pharmacy Consult for Vanco Indication: Sternal wound infection  Allergies  Allergen Reactions  . Bee Venom Shortness Of Breath and Swelling  . Crestor [Rosuvastatin] Other (See Comments)    MYALGIAS  . Diphenhydramine Hcl Other (See Comments)    Shaky legs  . Statins Other (See Comments)    MYALGIAS MUSCLE PAIN    Patient Measurements: Height: 5\' 10"  (177.8 cm) Weight: 144 lb (65.3 kg) IBW/kg (Calculated) : 73 Adjusted Body Weight:    Vital Signs: Temp: 98.3 F (36.8 C) (06/17 0511) Temp Source: Oral (06/17 0511) BP: 126/56 (06/17 0511) Pulse Rate: 56 (06/17 0511) Intake/Output from previous day: 06/16 0701 - 06/17 0700 In: 600 [IV Piggyback:600] Out: -  Intake/Output from this shift: No intake/output data recorded.  Labs: No results for input(s): WBC, HGB, PLT, LABCREA, CREATININE in the last 72 hours. Estimated Creatinine Clearance: 76 mL/min (by C-G formula based on SCr of 0.65 mg/dL).  Recent Labs  05/08/17 1304  [place] 13*     Microbiology:   Medical History: Past Medical History:  Diagnosis Date  . 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 Aortic stenosis   . Arthritis   . BPH (benign prostatic hyperplasia)   . CAD (coronary artery disease) 2004  . Dyspnea   . Heart attack ([place]) 2004  . Heart murmur   . Hypercholesterolemia   . 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 PVD (peripheral vascular disease) ([place])   . Wound infection 04/2017    Assessment:  ID: Sternal wound infection (Keflex PTA) . Purulence continues. Afeb, WBC 9. Vanco Trough =13 - 6/17 MD note says: Wound cultures growing serratia- sensitive to ceftriaxone- continue IV antibiotics for now  6/14: BC x 2: CNS x 1 - 6/14 BCID: MR staph (not aureus) 6/15: bC x 2:  Vanco 6/14>> Rocephin 6/15>> Zosyn 6/14>>6/15  6/17: VT = 13: no change  Goal of Therapy:  Vancomycin trough level 10-15 mcg/ml  Plan:  Continue Vancomycin 750mg  IV every 12  hours  [facility], PharmD, BCPS Clinical Staff Pharmacist Pager [phone]  Darin Simmons 05/08/2017,1:56 PM

## 2017-05-08 NOTE — Progress Notes (Addendum)
      [address]       [place] [address]             [phone]      Subjective:  Patient would like his bowel regimen adjusted.  He states he is doing well.  Objective: Vital signs in last 24 hours: Temp:  [97.8 F (36.6 C)-98.3 F (36.8 C)] 98.3 F (36.8 C) (06/17 0511) Pulse Rate:  [56-70] 56 (06/17 0511) Cardiac Rhythm: Normal sinus rhythm (06/17 0700) Resp:  [16] 16 (06/17 0511) BP: (111-126)/(56-60) 126/56 (06/17 0511) SpO2:  [98 %-100 %] 100 % (06/17 0511)  Intake/Output from previous day: 06/16 0701 - 06/17 0700 In: 600 [IV Piggyback:600] Out: -   General appearance: alert, cooperative and no distress Heart: regular rate and rhythm Lungs: clear to auscultation bilaterally Abdomen: soft, non-tender; bowel sounds normal; no masses,  no organomegaly Extremities: extremities normal, atraumatic, no cyanosis or edema Wound: no erythema, some purulent drainage remains present in middle of sternum  Lab Results:  Recent Labs  05/05/17 1244  WBC 9.0  HGB 10.1*  HCT 30.6*  PLT 324   BMET:  Recent Labs  05/05/17 1244  NA 137  K 4.4  CL 104  CO2 21*  GLUCOSE 88  BUN 11  CREATININE 0.65  CALCIUM 8.7*    PT/INR: No results for input(s): LABPROT, INR in the last 72 hours. ABG    Component Value Date/Time   PHART 7.352 04/22/2017 1723   HCO3 21.3 04/22/2017 1723   TCO2 26 04/23/2017 1716   ACIDBASEDEF 4.0 (H) 04/22/2017 1723   O2SAT 98.0 04/22/2017 1723   CBG (last 3)  No results for input(s): GLUCAP in the last 72 hours.  Assessment/Plan:  1. CV-  Sinus bradycardia, BP stable 2. ID- superficial sternal infection- purulence continues, remains afebrile... Blood cultures negative so far... Will continue IV ABX for now... If cultures remain negative, will switch to oral antibiotics 3. GI- adjusted bowel regimen 4. Dispo- patient stable, continue IV ABX, diligent wound care   LOS: 3 days    BARRETT, ERIN 05/08/2017 Patient  seen and examined, agree with above Wound cultures growing serratia- sensitive to ceftriaxone- continue IV antibiotics for now  [place]. Roxan Hockey, MD [facility] [phone]

## 2017-05-09 DIAGNOSIS — Y838 Other surgical procedures as the cause of abnormal reaction of the patient, or of later complication, without mention of misadventure at the time of the procedure: Secondary | ICD-10-CM

## 2017-05-09 DIAGNOSIS — B9689 Other specified bacterial agents as the cause of diseases classified elsewhere: Secondary | ICD-10-CM

## 2017-05-09 DIAGNOSIS — L0889 Other specified local infections of the skin and subcutaneous tissue: Secondary | ICD-10-CM

## 2017-05-09 DIAGNOSIS — S21109A Unspecified open wound of unspecified front wall of thorax without penetration into thoracic cavity, initial encounter: Secondary | ICD-10-CM

## 2017-05-09 DIAGNOSIS — Z952 Presence of prosthetic heart valve: Secondary | ICD-10-CM

## 2017-05-09 DIAGNOSIS — T814XXA Infection following a procedure, initial encounter: Principal | ICD-10-CM

## 2017-05-09 MED ORDER — DEXTROSE 5 % IV SOLN
2.0000 g | INTRAVENOUS | Status: DC
  Administered 2017-05-10 – 2017-05-15 (×6): 2 g via INTRAVENOUS
  Filled 2017-05-09 (×6): qty 2

## 2017-05-09 NOTE — Progress Notes (Signed)
    [facility] for Infectious Disease    Date of Admission:  05/05/2017     Reason for visit: Follow up on wound infection   Subjective: Reports his wound is less red and seems to be healing. He has been walking the halls without issues.  Medications:  . aspirin EC  325 mg Oral Daily  . docusate sodium  100 mg Oral QHS  . enoxaparin (LOVENOX) injection  40 mg Subcutaneous Q24H  . Melatonin  3 mg Oral QHS  . senna-docusate  1 tablet Oral BID  . sodium chloride flush  3 mL Intravenous Q12H  . sodium chloride flush  3 mL Intravenous Q12H    Objective: Vital signs in last 24 hours: Temp:  [98.5 F (36.9 C)] 98.5 F (36.9 C) (06/18 0435) Pulse Rate:  [56] 56 (06/18 0435) Resp:  [18] 18 (06/18 0435) BP: (105)/(55) 105/55 (06/18 0435) SpO2:  [98 %] 98 % (06/18 0435)  General: Elderly man sitting up in chair eating breakfast, NAD Skin: Minimal erythema and very minimal purulent drainage from middle of sternotomy    Microbiology: Blood cx 6/14>> 1 out of 2 coagulase negative staph Blood cx 6/15>> negative to date Wound cx 6/14>> Serratia marcescens, sensitive to ceftriaxone  Assessment/Plan:  Sternal wound infection: Culture growing Serratia sensitive to Ceftriaxone. Recommend to continue IV Ceftriaxone while in hospital and when ready for discharge can transition to Bactrim DS 800-160 mg BID for 7 days. Will schedule follow up in ID clinic.   Positive blood cx: Original blood cx with 1 out of 2 coagulase negative staph. Repeat blood cx on 6/15 without growth. This likely represents a contaminant. Ok to stop Vancomycin.   Attending note to follow.   Albin Felling, MD, MPH Internal Medicine Resident, PGY-3 Pager: [phone]  05/09/2017, 9:20 AM

## 2017-05-09 NOTE — Discharge Summary (Signed)
Physician Discharge Summary  Patient ID: JAHMEL FLANNAGAN MRN: [id number] DOB/AGE: 1941/02/07 76 y.o.  Admit date: 05/05/2017 Discharge date: 05/14/2017  Admission Diagnoses:  Patient Active Problem List   Diagnosis Date Noted  . Wound infection 05/05/2017  . 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 Aortic atherosclerosis ([place]) 02/13/2015  . Coronary atherosclerosis of native coronary artery 01/01/2014  . Mixed hyperlipidemia 01/01/2014  . Bilateral inguinal hernia 03/28/2012  . Umbilical hernia 60/08/9322  . Aortic stenosis 01/21/2011   Discharge Diagnoses:   Patient Active Problem List   Diagnosis Date Noted  . Wound infection 05/05/2017  . 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 Aortic atherosclerosis ([place]) 02/13/2015  . Coronary atherosclerosis of native coronary artery 01/01/2014  . Mixed hyperlipidemia 01/01/2014  . Bilateral inguinal hernia 03/28/2012  . Umbilical hernia 55/73/2202  . Aortic stenosis 01/21/2011   Discharged Condition: good  History of Present Illness:  Mr. Medeiros is a 76 yo white male with history of CAD, S/P DES to his RCA and hypercholesterolemia.  He is well known to [facility] as he recently underwent AVR on 04/22/2017.  His post operative course was unremarkable.  He presented to Dr. Earlene Plater office on 05/05/2017 for removal of his chest tube sutures, at which time the nurse noticed some drainage from his sternotomy.  The patient admitted it had started about a day prior and was clear, but now it had progressed to yellow green.  He denied fevers, chills, and night sweats.  Due to the patient have a valve prosthesis present he was admitted to the hospital for further workup of his sternal drainage.   Hospital Course:   Upon admission patient was started on empiric Vancomycin.  His wound was cultured and blood cultures were obtained.  His blood cultures came back positive in 1/2 bottles for Staph.  It was felt  this was likely contaminate but ID consult was obtained to ensure if positive appropriate treatment was initiated.  Repeat blood cultures were obtained and are negative.  His wound culture came back with Serratia Marcescens.  The patient was placed on Rocephin for this.  His sternal drainage worsened and he has required debridement in OR x 2 and placement of VAC.   He has been afebrile his entire hospital stay.  He has no leukocytosis.  He will require a total of 42 days of abx and home arrangements are made to continue rocephin. He has had some NSVT and bradycardia post op and was seen by cardiology and it requires no further tx at this time. At time of discharge he is felt to be stable. Significant Diagnostic Studies:    4d ago  Culture Few SERRATIA MARCESCENS   Gram Stain Abundant   Gram Stain WBC present-both PMN and Mononuclear   Gram Stain No Squamous Epithelial Cells Seen   Gram Stain No Organisms Seen   Gram Stain Gram Stain Report Called to,Read Back By    Gram Stain and Verified With:   Gram Stain SUSAN 05/05/17 AT 1314 BY DUNNJ Report Faxed by Request   Gram Stain [phone]   Organism ID, Bacteria SERRATIA MARCESCENS    Treatments: antibiotics: vancomycin, ceftriaxone and Bactrim DS  Disposition: 01-Home or Self Care   Discharge Medications:   Allergies as of 05/14/2017      Reactions   Bee Venom Shortness Of Breath, Swelling   Crestor [rosuvastatin] Other (See Comments)   MYALGIAS   Diphenhydramine Hcl Other (See Comments)  Shaky legs   Statins Other (See Comments)   MYALGIAS MUSCLE PAIN      Medication List    STOP taking these medications   cephALEXin 500 MG capsule Commonly known as:  KEFLEX   pravastatin 20 MG tablet Commonly known as:  PRAVACHOL     TAKE these medications   ARTIFICIAL TEARS OP Place 1 drop into both eyes as needed (for dry eyes).   aspirin EC 325 MG tablet Take 1 tablet (325 mg total) by mouth daily.   cefTRIAXone 2 g in dextrose  5 % 50 mL Inject 2 g into the vein daily.   Magnesium 250 MG Tabs Take 1 tablet (250 mg total) by mouth daily.   Melatonin 3 MG Tabs Take 3 mg by mouth at bedtime.   traMADol 50 MG tablet Commonly known as:  ULTRAM Take 1 tablet (50 mg total) by mouth every 6 (six) hours as needed for severe pain. What changed:  how much to take  how to take this  when to take this  reasons to take this  additional instructions      Follow-up Information    [facility]-Cardiac  Follow up on 05/18/2017.   Specialty:  Cardiothoracic Surgery Why:  Appointment is at 9:30 for wound check with Jolene/Dr. Lyn Henri information: [place], [place] Follow up.   Contact information: [address] [place] [address] [phone]           Signed: Makel Giovanni 05/14/2017, 1:00 PM

## 2017-05-09 NOTE — Progress Notes (Signed)
      [address]       [place] [address]             [phone]      Subjective:  Mr. Branton continues to do well.  He has no complaints.  He is ambulating  Objective: Vital signs in last 24 hours: Temp:  [98.5 F (36.9 C)] 98.5 F (36.9 C) (06/18 0435) Pulse Rate:  [56] 56 (06/18 0435) Cardiac Rhythm: Normal sinus rhythm (06/17 2314) Resp:  [18] 18 (06/18 0435) BP: (105)/(55) 105/55 (06/18 0435) SpO2:  [98 %] 98 % (06/18 0435)  Intake/Output from previous day: 06/17 0701 - 06/18 0700 In: 840 [P.O.:840] Out: -   General appearance: alert, cooperative and no distress Heart: regular rate and rhythm Lungs: clear to auscultation bilaterally Abdomen: soft, non-tender; bowel sounds normal; no masses,  no organomegaly Extremities: extremities normal, atraumatic, no cyanosis or edema Wound: minimal purulent drainage in middle of sternotomy  Lab Results: No results for input(s): WBC, HGB, HCT, PLT in the last 72 hours. BMET: No results for input(s): NA, K, CL, CO2, GLUCOSE, BUN, CREATININE, CALCIUM in the last 72 hours.  PT/INR: No results for input(s): LABPROT, INR in the last 72 hours. ABG    Component Value Date/Time   PHART 7.352 04/22/2017 1723   HCO3 21.3 04/22/2017 1723   TCO2 26 04/23/2017 1716   ACIDBASEDEF 4.0 (H) 04/22/2017 1723   O2SAT 98.0 04/22/2017 1723   CBG (last 3)  No results for input(s): GLUCAP in the last 72 hours.  Assessment/Plan:  1. CV- Sinus bradycardia, BP stable 2. ID- superficial sternal infection- + serratia from wound culture, on Rocephin.... Blood cultures remain negative 3. Dispo- patient stable, continue IV ABX for now, blood cultures remain negative... Can likely transition to oral antibiotics for discharge in next 24-48 hours pending ID input   LOS: 4 days    Ellwood Handler 05/09/2017

## 2017-05-09 NOTE — Care Management Important Message (Signed)
Important Message  Patient Details  Name: Darin Simmons MRN: [id number] Date of Birth: [birth year]   Medicare Important Message Given:  Yes    [name] May 05/09/2017, 10:53 AM

## 2017-05-10 ENCOUNTER — Encounter (HOSPITAL_COMMUNITY): Payer: Self-pay | Admitting: Radiology

## 2017-05-10 ENCOUNTER — Inpatient Hospital Stay (HOSPITAL_COMMUNITY): Payer: Medicare Other

## 2017-05-10 ENCOUNTER — Ambulatory Visit: Payer: Medicare Other | Admitting: Physician Assistant

## 2017-05-10 LAB — CULTURE, BLOOD (ROUTINE X 2)
Culture: NO GROWTH
Special Requests: ADEQUATE

## 2017-05-10 LAB — BASIC METABOLIC PANEL
Anion gap: 9 (ref 5–15)
BUN: 11 mg/dL (ref 6–20)
CALCIUM: 8.8 mg/dL — AB (ref 8.9–10.3)
CO2: 25 mmol/L (ref 22–32)
Chloride: 105 mmol/L (ref 101–111)
Creatinine, Ser: 0.62 mg/dL (ref 0.61–1.24)
GFR calc Af Amer: >60 mL/min (ref >60)
Glucose, Bld: 113 mg/dL — ABNORMAL HIGH (ref 65–99)
POTASSIUM: 3.7 mmol/L (ref 3.5–5.1)
SODIUM: 139 mmol/L (ref 135–145)

## 2017-05-10 LAB — MAGNESIUM: MAGNESIUM: 2.1 mg/dL (ref 1.7–2.4)

## 2017-05-10 MED ORDER — IOPAMIDOL (ISOVUE-300) INJECTION 61%
INTRAVENOUS | Status: AC
  Administered 2017-05-10: 75 mL
  Filled 2017-05-10: qty 75

## 2017-05-10 NOTE — Progress Notes (Signed)
    [facility]    Date of Admission:  05/05/2017     Reason for visit: Follow up on wound infection   Subjective: Reports he did not sleep well due to coughing. Notes drainage from sternal wound and that his bandage has been changed.   Medications:  . aspirin EC  325 mg Oral Daily  . docusate sodium  100 mg Oral QHS  . enoxaparin (LOVENOX) injection  40 mg Subcutaneous Q24H  . Melatonin  3 mg Oral QHS  . senna-docusate  1 tablet Oral BID  . sodium chloride flush  3 mL Intravenous Q12H  . sodium chloride flush  3 mL Intravenous Q12H    Objective: Vital signs in last 24 hours: Temp:  [98 F (36.7 C)-98.2 F (36.8 C)] 98 F (36.7 C) (06/19 0500) Pulse Rate:  [54-71] 54 (06/19 0500) Resp:  [18-20] 18 (06/19 0500) BP: (112-140)/(53-66) 112/53 (06/19 0500) SpO2:  [99 %-100 %] 99 % (06/19 0500) Weight:  [139 lb 8 oz (63.3 kg)] 139 lb 8 oz (63.3 kg) (06/19 0500)  General: Elderly man sitting up, NAD Skin: Erythema improving. Still having some greenish drainage from sternal wound, particularly in middle where there is a small opening. There is also an area towards the bottom of the incision site that is fluctuant.   Microbiology: Blood cx 6/14>> 1 out of 2 coagulase negative staph Blood cx 6/15>> negative to date Wound cx 6/14>> Serratia marcescens, sensitive to ceftriaxone  Assessment/Plan:  Sternal wound infection: Culture growing Serratia sensitive to Ceftriaxone. Will obtain a CT chest with contrast to evaluate for a possible abscess/deeper infection given the continued purulence from the wound and fluctuant area noted today. Continue Ceftriaxone. Will have better idea of how long he will need IV antibiotics after CT scan.    Attending note to follow.   Albin Felling, MD, MPH Internal Medicine Resident, PGY-3 Pager: [phone]  05/10/2017, 10:40 AM

## 2017-05-10 NOTE — Progress Notes (Addendum)
[address]       [place] [address]             [phone]         Subjective: Some discomfort overnight, improved today  Objective: Vital signs in last 24 hours: Temp:  [98 F (36.7 C)-98.2 F (36.8 C)] 98 F (36.7 C) (06/19 0500) Pulse Rate:  [54-71] 54 (06/19 0500) Cardiac Rhythm: Normal sinus rhythm (06/18 2010) Resp:  [18-20] 18 (06/19 0500) BP: (112-140)/(53-66) 112/53 (06/19 0500) SpO2:  [99 %-100 %] 99 % (06/19 0500) Weight:  [139 lb 8 oz (63.3 kg)] 139 lb 8 oz (63.3 kg) (06/19 0500)  Hemodynamic parameters for last 24 hours:    Intake/Output from previous day: 06/18 0701 - 06/19 0700 In: 960 [P.O.:960] Out: -  Intake/Output this shift: No intake/output data recorded.   Exam   Alert, NAD Lungs - clear Cor RRR, 2/6 systolic murmur Abd - benign Ext- minor edema Incis- small amount of yellow drainage    Lab Results: No results for input(s): WBC, HGB, HCT, PLT in the last 72 hours. BMET: No results for input(s): NA, K, CL, CO2, GLUCOSE, BUN, CREATININE, CALCIUM in the last 72 hours.  PT/INR: No results for input(s): LABPROT, INR in the last 72 hours. ABG    Component Value Date/Time   PHART 7.352 04/22/2017 1723   HCO3 21.3 04/22/2017 1723   TCO2 26 04/23/2017 1716   ACIDBASEDEF 4.0 (H) 04/22/2017 1723   O2SAT 98.0 04/22/2017 1723   CBG (last 3)  No results for input(s): GLUCAP in the last 72 hours.  Meds Scheduled Meds: . aspirin EC  325 mg Oral Daily  . docusate sodium  100 mg Oral QHS  . enoxaparin (LOVENOX) injection  40 mg Subcutaneous Q24H  . Melatonin  3 mg Oral QHS  . senna-docusate  1 tablet Oral BID  . sodium chloride flush  3 mL Intravenous Q12H  . sodium chloride flush  3 mL Intravenous Q12H   Continuous Infusions: . sodium chloride    . cefTRIAXone (ROCEPHIN)  IV     Results for orders placed or performed during the hospital encounter of 05/05/17  Culture, blood (Routine X 2) w Reflex to ID Panel      Status: None (Preliminary result)   Collection Time: 05/05/17 12:44 PM  Result Value Ref Range Status   Specimen Description BLOOD LEFT HAND  Final   Special Requests IN PEDIATRIC BOTTLE Blood Culture adequate volume  Final   Culture NO GROWTH 4 DAYS  Final   Report Status PENDING  Incomplete  Culture, blood (Routine X 2) w Reflex to ID Panel     Status: Abnormal   Collection Time: 05/05/17 12:44 PM  Result Value Ref Range Status   Specimen Description BLOOD RIGHT HAND  Final   Special Requests IN PEDIATRIC BOTTLE Blood Culture adequate volume  Final   Culture  Setup Time   Final    GRAM POSITIVE COCCI IN CLUSTERS IN PEDIATRIC BOTTLE CRITICAL RESULT CALLED TO, READ BACK BY AND VERIFIED WITH: E MARTIN,PHARMD AT 1103 05/06/17 BY L BENFIELD    Culture (A)  Final    STAPHYLOCOCCUS SPECIES (COAGULASE NEGATIVE) THE SIGNIFICANCE OF ISOLATING THIS ORGANISM FROM A SINGLE SET OF BLOOD CULTURES WHEN MULTIPLE SETS ARE DRAWN IS UNCERTAIN. PLEASE NOTIFY THE MICROBIOLOGY DEPARTMENT WITHIN ONE WEEK IF SPECIATION AND SENSITIVITIES ARE REQUIRED.    Report Status 05/08/2017 FINAL  Final  Blood Culture ID Panel (  Reflexed)     Status: Abnormal   Collection Time: 05/05/17 12:44 PM  Result Value Ref Range Status   Enterococcus species NOT DETECTED NOT DETECTED Final   Listeria monocytogenes NOT DETECTED NOT DETECTED Final   Staphylococcus species DETECTED (A) NOT DETECTED Final    Comment: Methicillin (oxacillin) resistant coagulase negative staphylococcus. Possible blood culture contaminant (unless isolated from more than one blood culture draw or clinical case suggests pathogenicity). No antibiotic treatment is indicated for blood  culture contaminants. CRITICAL RESULT CALLED TO, READ BACK BY AND VERIFIED WITH: E MARTIN,PHARMD AT 1103 05/06/17 BY L BENFIELD    Staphylococcus aureus NOT DETECTED NOT DETECTED Final   Methicillin resistance DETECTED (A) NOT DETECTED Final    Comment: CRITICAL RESULT  CALLED TO, READ BACK BY AND VERIFIED WITH: E MARTIN,PHARMD AT 1103 05/06/17 BY L BENFIELD    Streptococcus species NOT DETECTED NOT DETECTED Final   Streptococcus agalactiae NOT DETECTED NOT DETECTED Final   Streptococcus pneumoniae NOT DETECTED NOT DETECTED Final   Streptococcus pyogenes NOT DETECTED NOT DETECTED Final   Acinetobacter baumannii NOT DETECTED NOT DETECTED Final   Enterobacteriaceae species NOT DETECTED NOT DETECTED Final   Enterobacter cloacae complex NOT DETECTED NOT DETECTED Final   Escherichia coli NOT DETECTED NOT DETECTED Final   Klebsiella oxytoca NOT DETECTED NOT DETECTED Final   Klebsiella pneumoniae NOT DETECTED NOT DETECTED Final   Proteus species NOT DETECTED NOT DETECTED Final   Serratia marcescens NOT DETECTED NOT DETECTED Final   Haemophilus influenzae NOT DETECTED NOT DETECTED Final   Neisseria meningitidis NOT DETECTED NOT DETECTED Final   Pseudomonas aeruginosa NOT DETECTED NOT DETECTED Final   Candida albicans NOT DETECTED NOT DETECTED Final   Candida glabrata NOT DETECTED NOT DETECTED Final   Candida krusei NOT DETECTED NOT DETECTED Final   Candida parapsilosis NOT DETECTED NOT DETECTED Final   Candida tropicalis NOT DETECTED NOT DETECTED Final  Culture, blood (Routine X 2) w Reflex to ID Panel     Status: None (Preliminary result)   Collection Time: 05/06/17  4:21 PM  Result Value Ref Range Status   Specimen Description BLOOD RIGHT ARM  Final   Special Requests   Final    BOTTLES DRAWN AEROBIC AND ANAEROBIC Blood Culture results may not be optimal due to an excessive volume of blood received in culture bottles   Culture NO GROWTH 3 DAYS  Final   Report Status PENDING  Incomplete  Culture, blood (Routine X 2) w Reflex to ID Panel     Status: None (Preliminary result)   Collection Time: 05/06/17  4:25 PM  Result Value Ref Range Status   Specimen Description BLOOD LEFT HAND  Final   Special Requests   Final    BOTTLES DRAWN AEROBIC AND ANAEROBIC  Blood Culture results may not be optimal due to an excessive volume of blood received in culture bottles   Culture NO GROWTH 3 DAYS  Final   Report Status PENDING  Incomplete     PRN Meds:.sodium chloride, acetaminophen **OR** acetaminophen, psyllium, sodium chloride flush, traMADol  Xrays No results found.  Assessment/Plan:  1 stable on current management, home when IV abx can be transitioned to po safely 2 frequent PVC's including bigemminy - check bmet. MG++ level, HR slow at times so no Beta blocker    LOS: 5 days    GOLD,WAYNE E 05/10/2017 Patient seen and examined.  His wound is still draining purulent material tracking down from above. There also is a new area  of fluctuance inferiorly. He is not responding to antibiotics as hoped. I think the only option at this point is to take him to the OR and open the wound more to allow packing. I doubt there is enough soft tissue to allow VAC placement, but that is a possibility as well. I informed him of the indications, risks, and benefits. He agrees to proceed.  Revonda Standard Roxan Hockey, MD [facility] [phone]

## 2017-05-11 ENCOUNTER — Encounter (HOSPITAL_COMMUNITY): Payer: Self-pay | Admitting: Certified Registered Nurse Anesthetist

## 2017-05-11 ENCOUNTER — Encounter (HOSPITAL_COMMUNITY)
Admission: AD | Disposition: A | Discharge status: Home / Self Care | Payer: Self-pay | Source: Ambulatory Visit | Attending: Surgery

## 2017-05-11 ENCOUNTER — Inpatient Hospital Stay (HOSPITAL_COMMUNITY): Payer: Medicare Other | Admitting: Certified Registered Nurse Anesthetist

## 2017-05-11 DIAGNOSIS — R7881 Bacteremia: Secondary | ICD-10-CM

## 2017-05-11 DIAGNOSIS — I9789 Other postprocedural complications and disorders of the circulatory system, not elsewhere classified: Secondary | ICD-10-CM

## 2017-05-11 DIAGNOSIS — M868X8 Other osteomyelitis, other site: Secondary | ICD-10-CM

## 2017-05-11 DIAGNOSIS — Z91030 Bee allergy status: Secondary | ICD-10-CM

## 2017-05-11 DIAGNOSIS — Z888 Allergy status to other drugs, medicaments and biological substances status: Secondary | ICD-10-CM

## 2017-05-11 DIAGNOSIS — T814XXD Infection following a procedure, subsequent encounter: Secondary | ICD-10-CM

## 2017-05-11 HISTORY — PX: STERNAL WOUND DEBRIDEMENT: SHX1058

## 2017-05-11 LAB — CULTURE, BLOOD (ROUTINE X 2)
CULTURE: NO GROWTH
Culture: NO GROWTH

## 2017-05-11 SURGERY — DEBRIDEMENT, WOUND, STERNUM
Anesthesia: General

## 2017-05-11 MED ORDER — ONDANSETRON HCL 4 MG/2ML IJ SOLN
INTRAMUSCULAR | Status: DC | PRN
  Administered 2017-05-11: 4 mg via INTRAVENOUS

## 2017-05-11 MED ORDER — PROPOFOL 10 MG/ML IV BOLUS
INTRAVENOUS | Status: DC | PRN
  Administered 2017-05-11: 50 mg via INTRAVENOUS
  Administered 2017-05-11: 100 mg via INTRAVENOUS
  Administered 2017-05-11: 50 mg via INTRAVENOUS

## 2017-05-11 MED ORDER — OXYCODONE HCL 5 MG PO TABS: 5.0000 mg | ORAL_TABLET | Freq: Once | ORAL | Status: DC | PRN

## 2017-05-11 MED ORDER — LACTATED RINGERS IV SOLN
INTRAVENOUS | Status: DC | PRN
  Administered 2017-05-11: 07:00:00 via INTRAVENOUS

## 2017-05-11 MED ORDER — OXYCODONE HCL 5 MG/5ML PO SOLN: 5.0000 mg | Freq: Once | ORAL | Status: DC | PRN

## 2017-05-11 MED ORDER — PROPOFOL 10 MG/ML IV BOLUS
INTRAVENOUS | Status: AC
  Filled 2017-05-11: qty 20

## 2017-05-11 MED ORDER — SUCCINYLCHOLINE CHLORIDE 200 MG/10ML IV SOSY
PREFILLED_SYRINGE | INTRAVENOUS | Status: AC
  Filled 2017-05-11: qty 10

## 2017-05-11 MED ORDER — SUCCINYLCHOLINE CHLORIDE 20 MG/ML IJ SOLN
INTRAMUSCULAR | Status: DC | PRN
  Administered 2017-05-11: 100 mg via INTRAVENOUS

## 2017-05-11 MED ORDER — FENTANYL CITRATE (PF) 100 MCG/2ML IJ SOLN: 25.0000 ug | INTRAMUSCULAR | Status: DC | PRN

## 2017-05-11 MED ORDER — EPHEDRINE SULFATE 50 MG/ML IJ SOLN
INTRAMUSCULAR | Status: DC | PRN
  Administered 2017-05-11 (×2): 10 mg via INTRAVENOUS

## 2017-05-11 MED ORDER — ONDANSETRON HCL 4 MG/2ML IJ SOLN
INTRAMUSCULAR | Status: AC
  Filled 2017-05-11: qty 2

## 2017-05-11 MED ORDER — DEXAMETHASONE SODIUM PHOSPHATE 10 MG/ML IJ SOLN
INTRAMUSCULAR | Status: AC
  Filled 2017-05-11: qty 1

## 2017-05-11 MED ORDER — 0.9 % SODIUM CHLORIDE (POUR BTL) OPTIME
TOPICAL | Status: DC | PRN
  Administered 2017-05-11: 1000 mL

## 2017-05-11 MED ORDER — ONDANSETRON HCL 4 MG/2ML IJ SOLN: 4.0000 mg | Freq: Four times a day (QID) | INTRAMUSCULAR | Status: DC | PRN

## 2017-05-11 MED ORDER — DEXAMETHASONE SODIUM PHOSPHATE 10 MG/ML IJ SOLN
INTRAMUSCULAR | Status: DC | PRN
  Administered 2017-05-11: 5 mg via INTRAVENOUS

## 2017-05-11 MED ORDER — EPHEDRINE 5 MG/ML INJ
INTRAVENOUS | Status: AC
  Filled 2017-05-11: qty 10

## 2017-05-11 MED ORDER — FENTANYL CITRATE (PF) 250 MCG/5ML IJ SOLN
INTRAMUSCULAR | Status: AC
  Filled 2017-05-11: qty 5

## 2017-05-11 MED ORDER — FENTANYL CITRATE (PF) 100 MCG/2ML IJ SOLN
INTRAMUSCULAR | Status: DC | PRN
  Administered 2017-05-11: 50 ug via INTRAVENOUS

## 2017-05-11 SURGICAL SUPPLY — 39 items
BLADE SURG 10 STRL SS (BLADE) ×3 IMPLANT
CANISTER SUCT 3000ML PPV (MISCELLANEOUS) ×3 IMPLANT
CONT SPEC 4OZ CLIKSEAL STRL BL (MISCELLANEOUS) ×3 IMPLANT
DRAPE LAPAROSCOPIC ABDOMINAL (DRAPES) ×3 IMPLANT
DRAPE SLUSH/WARMER DISC (DRAPES) IMPLANT
DRSG PAD ABDOMINAL 8X10 ST (GAUZE/BANDAGES/DRESSINGS) IMPLANT
DRSG VAC ATS SM SENSATRAC (GAUZE/BANDAGES/DRESSINGS) ×3 IMPLANT
DRSG VERSA FOAM LRG 10X15 (GAUZE/BANDAGES/DRESSINGS) ×3 IMPLANT
ELECT REM PT RETURN 9FT ADLT (ELECTROSURGICAL) ×3
ELECTRODE REM PT RTRN 9FT ADLT (ELECTROSURGICAL) ×1 IMPLANT
GAUZE SPONGE 4X4 12PLY STRL (GAUZE/BANDAGES/DRESSINGS) ×3 IMPLANT
GLOVE EUDERMIC 7 POWDERFREE (GLOVE) ×6 IMPLANT
GOWN STRL REUS W/ TWL LRG LVL3 (GOWN DISPOSABLE) ×2 IMPLANT
GOWN STRL REUS W/ TWL XL LVL3 (GOWN DISPOSABLE) ×1 IMPLANT
GOWN STRL REUS W/TWL LRG LVL3 (GOWN DISPOSABLE) ×4
GOWN STRL REUS W/TWL XL LVL3 (GOWN DISPOSABLE) ×2
HANDPIECE INTERPULSE COAX TIP (DISPOSABLE)
HEMOSTAT POWDER SURGIFOAM 1G (HEMOSTASIS) IMPLANT
KIT BASIN OR (CUSTOM PROCEDURE TRAY) ×3 IMPLANT
KIT ROOM TURNOVER OR (KITS) ×3 IMPLANT
NS IRRIG 1000ML POUR BTL (IV SOLUTION) ×6 IMPLANT
PACK CHEST (CUSTOM PROCEDURE TRAY) ×3 IMPLANT
PAD ARMBOARD 7.5X6 YLW CONV (MISCELLANEOUS) ×6 IMPLANT
SET HNDPC FAN SPRY TIP SCT (DISPOSABLE) IMPLANT
SPONGE LAP 18X18 X RAY DECT (DISPOSABLE) ×3 IMPLANT
SUT STEEL 6MS V (SUTURE) IMPLANT
SUT STEEL STERNAL CCS#1 18IN (SUTURE) IMPLANT
SUT STEEL SZ 6 DBL 3X14 BALL (SUTURE) IMPLANT
SUT VIC AB 1 CTX 36 (SUTURE) ×4
SUT VIC AB 1 CTX36XBRD ANBCTR (SUTURE) ×2 IMPLANT
SUT VIC AB 2-0 CTX 27 (SUTURE) ×6 IMPLANT
SUT VIC AB 3-0 X1 27 (SUTURE) ×6 IMPLANT
SWAB COLLECTION DEVICE MRSA (MISCELLANEOUS) ×3 IMPLANT
SWAB CULTURE ESWAB REG 1ML (MISCELLANEOUS) IMPLANT
SYR 5ML LL (SYRINGE) IMPLANT
TOWEL OR 17X24 6PK STRL BLUE (TOWEL DISPOSABLE) ×3 IMPLANT
TOWEL OR 17X26 10 PK STRL BLUE (TOWEL DISPOSABLE) ×3 IMPLANT
TRAY FOLEY W/METER SILVER 14FR (SET/KITS/TRAYS/PACK) IMPLANT
WATER STERILE IRR 1000ML POUR (IV SOLUTION) ×3 IMPLANT

## 2017-05-11 NOTE — Progress Notes (Signed)
    [facility]    Date of Admission:  05/05/2017     Reason for visit: Follow up on wound infection   Subjective: Went to the OR this morning for wound irrigation and debridement. Reports he is doing well now and has been walking with the wound vac without issues.   Medications:  . aspirin EC  325 mg Oral Daily  . docusate sodium  100 mg Oral QHS  . enoxaparin (LOVENOX) injection  40 mg Subcutaneous Q24H  . Melatonin  3 mg Oral QHS  . senna-docusate  1 tablet Oral BID  . sodium chloride flush  3 mL Intravenous Q12H  . sodium chloride flush  3 mL Intravenous Q12H    Objective: Vital signs in last 24 hours: Temp:  [97.7 F (36.5 C)-98.3 F (36.8 C)] 98.2 F (36.8 C) (06/20 1224) Pulse Rate:  [59-65] 60 (06/20 1224) Resp:  [12-20] 18 (06/20 1224) BP: (109-134)/(51-62) 109/51 (06/20 1224) SpO2:  [97 %-100 %] 99 % (06/20 1224)  General: Elderly man sitting up, NAD Skin: Wound vac in place along sternum    Microbiology: Blood cx 6/14>> 1 out of 2 coagulase negative staph Blood cx 6/15>> negative to date Wound cx 6/14>> Serratia marcescens, sensitive to ceftriaxone  Assessment/Plan:  Sternal wound infection: Wound cx growing Serratia. CT chest yesterday concerning for dehiscence of the manubrium and possible osteomyelitis. He underwent I&D by [facility] this morning and now has a wound vac placed. Will likely need IV Ceftriaxone for 6 weeks given evidence of possible osteomyelitis. Can complete his IV antibiotics in home setting as he is very independent.    Attending note to follow.   Albin Felling, MD, MPH Internal Medicine Resident, PGY-3 Pager: [phone]  05/11/2017, 4:39 PM

## 2017-05-11 NOTE — Brief Op Note (Signed)
05/05/2017 - 05/11/2017  10:52 AM  PATIENT:  Darin Simmons  76 y.o. male  PRE-OPERATIVE DIAGNOSIS:  STERNAL WOUND INFECTION  POST-OPERATIVE DIAGNOSIS:  STERNAL WOUND INFECTION  PROCEDURE:  Procedure(s): STERNAL WOUND IRRIGATION AND DEBRIDEMENT (N/A)  SURGEON:  Surgeon(s) and Role:    * Melrose Nakayama, MD - Primary   ANESTHESIA:   general  EBL:  Total I/O In: 500 [I.V.:500] Out: 260 [Urine:250; Blood:10]  BLOOD ADMINISTERED:none  DRAINS: Wound VAC   LOCAL MEDICATIONS USED:  NONE  SPECIMEN:  Source of Specimen:  sternal wound  DISPOSITION OF SPECIMEN:  Path and micro  PLAN OF CARE: Admit to inpatient   PATIENT DISPOSITION:  PACU - hemodynamically stable.   Delay start of Pharmacological VTE agent (>24hrs) due to surgical blood loss or risk of bleeding: not applicable

## 2017-05-11 NOTE — H&P (View-Only) (Signed)
[address]       [place] [address]             [phone]         Subjective: Some discomfort overnight, improved today  Objective: Vital signs in last 24 hours: Temp:  [98 F (36.7 C)-98.2 F (36.8 C)] 98 F (36.7 C) (06/19 0500) Pulse Rate:  [54-71] 54 (06/19 0500) Cardiac Rhythm: Normal sinus rhythm (06/18 2010) Resp:  [18-20] 18 (06/19 0500) BP: (112-140)/(53-66) 112/53 (06/19 0500) SpO2:  [99 %-100 %] 99 % (06/19 0500) Weight:  [139 lb 8 oz (63.3 kg)] 139 lb 8 oz (63.3 kg) (06/19 0500)  Hemodynamic parameters for last 24 hours:    Intake/Output from previous day: 06/18 0701 - 06/19 0700 In: 960 [P.O.:960] Out: -  Intake/Output this shift: No intake/output data recorded.   Exam   Alert, NAD Lungs - clear Cor RRR, 2/6 systolic murmur Abd - benign Ext- minor edema Incis- small amount of yellow drainage    Lab Results: No results for input(s): WBC, HGB, HCT, PLT in the last 72 hours. BMET: No results for input(s): NA, K, CL, CO2, GLUCOSE, BUN, CREATININE, CALCIUM in the last 72 hours.  PT/INR: No results for input(s): LABPROT, INR in the last 72 hours. ABG    Component Value Date/Time   PHART 7.352 04/22/2017 1723   HCO3 21.3 04/22/2017 1723   TCO2 26 04/23/2017 1716   ACIDBASEDEF 4.0 (H) 04/22/2017 1723   O2SAT 98.0 04/22/2017 1723   CBG (last 3)  No results for input(s): GLUCAP in the last 72 hours.  Meds Scheduled Meds: . aspirin EC  325 mg Oral Daily  . docusate sodium  100 mg Oral QHS  . enoxaparin (LOVENOX) injection  40 mg Subcutaneous Q24H  . Melatonin  3 mg Oral QHS  . senna-docusate  1 tablet Oral BID  . sodium chloride flush  3 mL Intravenous Q12H  . sodium chloride flush  3 mL Intravenous Q12H   Continuous Infusions: . sodium chloride    . cefTRIAXone (ROCEPHIN)  IV     Results for orders placed or performed during the hospital encounter of 05/05/17  Culture, blood (Routine X 2) w Reflex to ID Panel      Status: None (Preliminary result)   Collection Time: 05/05/17 12:44 PM  Result Value Ref Range Status   Specimen Description BLOOD LEFT HAND  Final   Special Requests IN PEDIATRIC BOTTLE Blood Culture adequate volume  Final   Culture NO GROWTH 4 DAYS  Final   Report Status PENDING  Incomplete  Culture, blood (Routine X 2) w Reflex to ID Panel     Status: Abnormal   Collection Time: 05/05/17 12:44 PM  Result Value Ref Range Status   Specimen Description BLOOD RIGHT HAND  Final   Special Requests IN PEDIATRIC BOTTLE Blood Culture adequate volume  Final   Culture  Setup Time   Final    GRAM POSITIVE COCCI IN CLUSTERS IN PEDIATRIC BOTTLE CRITICAL RESULT CALLED TO, READ BACK BY AND VERIFIED WITH: E MARTIN,PHARMD AT 1103 05/06/17 BY L BENFIELD    Culture (A)  Final    STAPHYLOCOCCUS SPECIES (COAGULASE NEGATIVE) THE SIGNIFICANCE OF ISOLATING THIS ORGANISM FROM A SINGLE SET OF BLOOD CULTURES WHEN MULTIPLE SETS ARE DRAWN IS UNCERTAIN. PLEASE NOTIFY THE MICROBIOLOGY DEPARTMENT WITHIN ONE WEEK IF SPECIATION AND SENSITIVITIES ARE REQUIRED.    Report Status 05/08/2017 FINAL  Final  Blood Culture ID Panel (  Reflexed)     Status: Abnormal   Collection Time: 05/05/17 12:44 PM  Result Value Ref Range Status   Enterococcus species NOT DETECTED NOT DETECTED Final   Listeria monocytogenes NOT DETECTED NOT DETECTED Final   Staphylococcus species DETECTED (A) NOT DETECTED Final    Comment: Methicillin (oxacillin) resistant coagulase negative staphylococcus. Possible blood culture contaminant (unless isolated from more than one blood culture draw or clinical case suggests pathogenicity). No antibiotic treatment is indicated for blood  culture contaminants. CRITICAL RESULT CALLED TO, READ BACK BY AND VERIFIED WITH: E MARTIN,PHARMD AT 1103 05/06/17 BY L BENFIELD    Staphylococcus aureus NOT DETECTED NOT DETECTED Final   Methicillin resistance DETECTED (A) NOT DETECTED Final    Comment: CRITICAL RESULT  CALLED TO, READ BACK BY AND VERIFIED WITH: E MARTIN,PHARMD AT 1103 05/06/17 BY L BENFIELD    Streptococcus species NOT DETECTED NOT DETECTED Final   Streptococcus agalactiae NOT DETECTED NOT DETECTED Final   Streptococcus pneumoniae NOT DETECTED NOT DETECTED Final   Streptococcus pyogenes NOT DETECTED NOT DETECTED Final   Acinetobacter baumannii NOT DETECTED NOT DETECTED Final   Enterobacteriaceae species NOT DETECTED NOT DETECTED Final   Enterobacter cloacae complex NOT DETECTED NOT DETECTED Final   Escherichia coli NOT DETECTED NOT DETECTED Final   Klebsiella oxytoca NOT DETECTED NOT DETECTED Final   Klebsiella pneumoniae NOT DETECTED NOT DETECTED Final   Proteus species NOT DETECTED NOT DETECTED Final   Serratia marcescens NOT DETECTED NOT DETECTED Final   Haemophilus influenzae NOT DETECTED NOT DETECTED Final   Neisseria meningitidis NOT DETECTED NOT DETECTED Final   Pseudomonas aeruginosa NOT DETECTED NOT DETECTED Final   Candida albicans NOT DETECTED NOT DETECTED Final   Candida glabrata NOT DETECTED NOT DETECTED Final   Candida krusei NOT DETECTED NOT DETECTED Final   Candida parapsilosis NOT DETECTED NOT DETECTED Final   Candida tropicalis NOT DETECTED NOT DETECTED Final  Culture, blood (Routine X 2) w Reflex to ID Panel     Status: None (Preliminary result)   Collection Time: 05/06/17  4:21 PM  Result Value Ref Range Status   Specimen Description BLOOD RIGHT ARM  Final   Special Requests   Final    BOTTLES DRAWN AEROBIC AND ANAEROBIC Blood Culture results may not be optimal due to an excessive volume of blood received in culture bottles   Culture NO GROWTH 3 DAYS  Final   Report Status PENDING  Incomplete  Culture, blood (Routine X 2) w Reflex to ID Panel     Status: None (Preliminary result)   Collection Time: 05/06/17  4:25 PM  Result Value Ref Range Status   Specimen Description BLOOD LEFT HAND  Final   Special Requests   Final    BOTTLES DRAWN AEROBIC AND ANAEROBIC  Blood Culture results may not be optimal due to an excessive volume of blood received in culture bottles   Culture NO GROWTH 3 DAYS  Final   Report Status PENDING  Incomplete     PRN Meds:.sodium chloride, acetaminophen **OR** acetaminophen, psyllium, sodium chloride flush, traMADol  Xrays No results found.  Assessment/Plan:  1 stable on current management, home when IV abx can be transitioned to po safely 2 frequent PVC's including bigemminy - check bmet. MG++ level, HR slow at times so no Beta blocker    LOS: 5 days    Zyrell Carmean E 05/10/2017 Patient seen and examined.  His wound is still draining purulent material tracking down from above. There also is a new area  of fluctuance inferiorly. He is not responding to antibiotics as hoped. I think the only option at this point is to take him to the OR and open the wound more to allow packing. I doubt there is enough soft tissue to allow VAC placement, but that is a possibility as well. I informed him of the indications, risks, and benefits. He agrees to proceed.  Revonda Standard Roxan Hockey, MD [facility] [phone]

## 2017-05-11 NOTE — Interval H&P Note (Signed)
History and Physical Interval Note:  05/11/2017 8:47 AM  Darin Simmons  has presented today for surgery, with the diagnosis of STERNAL WOUND INFECTION  The various methods of treatment have been discussed with the patient and family. After consideration of risks, benefits and other options for treatment, the patient has consented to  Procedure(s): [place] (N/A) as a surgical intervention .  The patient's history has been reviewed, patient examined, no change in status, stable for surgery.  I have reviewed the patient's chart and labs.  Questions were answered to the patient's satisfaction.     Melrose Nakayama

## 2017-05-11 NOTE — Anesthesia Preprocedure Evaluation (Signed)
Anesthesia Evaluation  Patient identified by MRN, date of birth, ID band Patient awake    Reviewed: Allergy & Precautions, H&P , NPO status , Patient's Chart, lab work & pertinent test results  Airway Mallampati: II   Neck ROM: full    Dental   Pulmonary former smoker,    breath sounds clear to auscultation       Cardiovascular + CAD, + Past MI and + Peripheral Vascular Disease  + Valvular Problems/Murmurs AS  Rhythm:regular Rate:Normal  S/p AVR   Neuro/Psych    GI/Hepatic   Endo/Other    Renal/GU      Musculoskeletal  (+) Arthritis ,   Abdominal   Peds  Hematology   Anesthesia Other Findings   Reproductive/Obstetrics                             Anesthesia Physical Anesthesia Plan  ASA: III  Anesthesia Plan:    Post-op Pain Management:    Induction: Intravenous  PONV Risk Score and Plan: 1 and Ondansetron, Dexamethasone and Treatment may vary due to age or medical condition  Airway Management Planned: Oral ETT  Additional Equipment:   Intra-op Plan:   Post-operative Plan: Extubation in OR  Informed Consent: I have reviewed the patients History and Physical, chart, labs and discussed the procedure including the risks, benefits and alternatives for the proposed anesthesia with the patient or authorized representative who has indicated his/her understanding and acceptance.     Plan Discussed with: CRNA, Anesthesiologist and Surgeon  Anesthesia Plan Comments:         Anesthesia Quick Evaluation

## 2017-05-11 NOTE — Progress Notes (Addendum)
[name]    Date of Admission:  05/05/2017   Total days of antibiotics 7        Day 6 ceftriaxone           ID: Darin Simmons is a 76 y.o. 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 More cx sent from or today. He is feeling better since going to the OR, ambulated, foley catheter removed  I have reviewed his mri imaging suggestive of early osteo of manubrium Medications:  . aspirin EC  325 mg Oral Daily  . docusate sodium  100 mg Oral QHS  . enoxaparin (LOVENOX) injection  40 mg Subcutaneous Q24H  . Melatonin  3 mg Oral QHS  . senna-docusate  1 tablet Oral BID  . sodium chloride flush  3 mL Intravenous Q12H  . sodium chloride flush  3 mL Intravenous Q12H    Objective: Vital signs in last 24 hours: Temp:  [97.7 F (36.5 C)-98.3 F (36.8 C)] 98.2 F (36.8 C) (06/20 1224) Pulse Rate:  [59-65] 60 (06/20 1224) Resp:  [12-20] 18 (06/20 1224) BP: (109-134)/(51-62) 109/51 (06/20 1224) SpO2:  [97 %-100 %] 99 % (06/20 1224) Physical Exam  Constitutional: He is oriented to person, place, and time. He appears well-developed and well-nourished. No distress.  HENT:  Mouth/Throat: Oropharynx is clear and moist. No oropharyngeal exudate.  Chest wall = wound vac in place, no surrounding erythema  Neurological: He is alert and oriented to person, place, and time.  Skin: Skin is warm and dry. No rash noted. No erythema.  Psychiatric: He has a normal mood and affect. His behavior is normal.     Lab Results  Recent Labs  05/10/17 0808  NA 139  K 3.7  CL 105  CO2 25  BUN 11  CREATININE 0.62    Microbiology: 6/14 blood cx serrratia Studies/Results: Ct Chest W Contrast  Result Date: 05/10/2017 CLINICAL DATA:  76 y/o M; aortic valve replacement 04/22/2017. Sternal  wound infection with soreness in the sternum. EXAM: CT CHEST WITH CONTRAST TECHNIQUE: Multidetector CT imaging of the chest was performed during intravenous contrast administration. CONTRAST:  57m ISOVUE-300 IOPAMIDOL (ISOVUE-300) INJECTION 61% COMPARISON:  04/06/2017 chest CT FINDINGS: Cardiovascular: Stable heart size. Moderate to severe coronary artery calcification. RCA stent. Aortic valve replacement. Normal caliber aorta with mild calcific atherosclerosis. Mediastinum/Nodes: No lymphadenopathy. Normal thoracic esophagus. Unremarkable thyroid gland. Lungs/Pleura: Right lung base dependent platelike atelectasis. Mild centrilobular emphysema in the lung apices. No consolidation or pneumothorax. Trace right pleural effusion. Upper Abdomen: Nonspecific subcentimeter calcification within segment 8 of the liver and tiny hypodense foci probably representing cysts. Musculoskeletal: Approximately 6 mm of dehiscence of the manubrium. The sternum is well apposed. Ill-defined lucency of the bony margins of the manubrium as well as of the superior aspect of the sternum (series 5, image 12). Sternotomy wires are intact. There is soft tissue thickening posterior to the sternotomy without a discrete rim enhancing collection probably representing postsurgical changes. No definite evidence for mediastinum disc. Fat stranding within the chest wall anterior to the sternum and manubrium extending inferiorly into the subxiphoid region, no discrete collection. IMPRESSION: 1. 6 mm dehiscence of the manubrium.  Well opposed sternum. 2. Ill-defined lucency of bony margins of the manubrium as well as superior aspect of sternum may represent  osteomyelitis in setting of infection. 3. Mild fat stranding in the chest wall anterior to the sternum and manubrium extending in the subxiphoid region may be related to infection and/or postsurgical changes, no discrete abscess identified. 4. Ill-defined soft tissue subjacent to the sternum in the  anterior mediastinum without discrete fluid collection probably representing postsurgical changes. 5. Trace right pleural effusion. Electronically Signed   By: Kristine Garbe M.D.   On: 05/10/2017 15:10     Assessment/Plan: Serratia superficial wound s/p debridement with secondary bacteremia. 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 Bee Venom Shortness Of Breath and Swelling  . Crestor [Rosuvastatin] Other (See Comments)    MYALGIAS  . Diphenhydramine Hcl Other (See Comments)    Shaky legs  . Statins Other (See Comments)    MYALGIAS MUSCLE PAIN    Discharge antibiotics: Per pharmacy protocol : ceftiraxone 2gm iv daily  Duration: 6 wk End Date: July 25th  [place] Per Protocol:  Labs weekly while on IV antibiotics: __x CBC with differential __x BMP __ CMP _x_ CRP _x_ ESR   _x_ Please pull PIC at completion of IV antibiotics   Fax weekly labs to [phone]  Clinic Follow Up Appt: 4-5 wk  @    [facility], [facility] Cell: [phone] Pager: [phone]  05/11/2017, 2:08 PM

## 2017-05-11 NOTE — Progress Notes (Signed)
      [address]       [place] [address]             [phone]      No complaints after I & D of sternal wound/ VAC placement  BP 124/62   Pulse 62   Temp 97.8 F (36.6 C)   Resp 12   Ht 5\' 10"  (1.778 m)   Wt 139 lb 8 oz (63.3 kg)   SpO2 97%   BMI 20.02 kg/m    Intake/Output Summary (Last 24 hours) at 05/11/17 1222 Last data filed at 05/11/17 6295  Gross per 24 hour  Intake             1130 ml  Output              260 ml  Net              870 ml    Vac in place  Continue ceftriaxone for serratia superficial wound infection  Plan VAC change in OR Friday morning  Remo Lipps C. Roxan Hockey, MD [facility] [phone]

## 2017-05-11 NOTE — Transfer of Care (Signed)
Immediate Anesthesia Transfer of Care Note  Patient: Darin Simmons  Procedure(s) Performed: Procedure(s): STERNAL WOUND IRRIGATION AND DEBRIDEMENT (N/A)  Patient Location: PACU  Anesthesia Type:General  Level of Consciousness: awake, alert , oriented and patient cooperative  Airway & Oxygen Therapy: Patient Spontanous Breathing and Patient connected to nasal cannula oxygen  Post-op Assessment: Report given to RN and Post -op Vital signs reviewed and stable  Post vital signs: Reviewed and stable  Last Vitals:  Vitals:   05/10/17 2038 05/11/17 0500  BP: (!) 130/54 (!) 134/57  Pulse: 62 (!) 59  Resp: 18 20  Temp: 36.8 C 36.8 C    Last Pain:  Vitals:   05/11/17 0500  TempSrc: Oral  PainSc:       Patients Stated Pain Goal: 0 (01/14/35 1224)  Complications: No apparent anesthesia complications and Patient re-intubated

## 2017-05-11 NOTE — Anesthesia Procedure Notes (Signed)
Procedure Name: Intubation Date/Time: 05/11/2017 9:11 AM Performed by: Salli Quarry Harjas Biggins Pre-anesthesia Checklist: Patient identified, Emergency Drugs available, Suction available and Patient being monitored Patient Re-evaluated:Patient Re-evaluated prior to inductionOxygen Delivery Method: Circle System Utilized Preoxygenation: Pre-oxygenation with 100% oxygen Intubation Type: IV induction Ventilation: Mask ventilation without difficulty Laryngoscope Size: Mac and 4 Grade View: Grade I Tube type: Oral Tube size: 7.5 mm Number of attempts: 1 Airway Equipment and Method: Stylet Placement Confirmation: ETT inserted through vocal cords under direct vision,  positive ETCO2 and breath sounds checked- equal and bilateral Secured at: 22 cm Tube secured with: Tape Dental Injury: Teeth and Oropharynx as per pre-operative assessment  Comments: Attempted to place LMA 5 x3 with significant air leak, decision to convert to ETT.

## 2017-05-11 NOTE — Op Note (Signed)
NAMENIGUEL, MOURE NO.:  [id number]  MEDICAL RECORD NO.:  [id number]  LOCATION:                                 FACILITY:  PHYSICIAN:  Revonda Standard. Roxan Hockey, M.D. DATE OF BIRTH:  DATE OF PROCEDURE:  05/11/2017 DATE OF DISCHARGE:                              OPERATIVE REPORT   PREOPERATIVE DIAGNOSIS:  Superficial sternal wound infection.  POSTOPERATIVE DIAGNOSIS:  Superficial sternal wound infection.  PROCEDURE:  Incision and drainage of sternal wound with VAC placement.  SURGEON:  Revonda Standard. Roxan Hockey, M.D.  ASSISTANT:  None.  ANESTHESIA:  General.  FINDINGS:  Necrotic superficial subcutaneous fatty tissue, purulent drainage.  No evidence of deep infection.  CLINICAL NOTE:  Mr. Damon is a 76 year old man who had aortic valve replacement on April 22, 2017.  He was discharged on April 28, 2017.  He was seen back in the office on May 05, 2017 and noted to have drainage from the sternal incision.  It was yellowish-greenish in color.  He did not have any fevers, chills, or signs of systemic infection.  He was admitted on May 05, 2017 and started on intravenous antibiotics. Cultures eventually grew Serratia marcescens.  Despite intravenous antibiotics, the wound continued to drain, and there was persistent erythema above the portion of the wound that was dehisced.  There also was a new fluctuant area just below that, approximately 2 cm in length. The patient was advised to undergo surgical incision and drainage and possible VAC placement for management of the wound.  OPERATIVE NOTE:  Mr. Rowser was brought to the operating room on May 11, 2017.  He had induction of general anesthesia and was intubated.  He was already receiving intravenous antibiotics.  A Foley catheter was placed.  The chest was prepped and draped in the usual sterile fashion. The most inferior area of fluctuance had spontaneously drained overnight, it also had yellowish green  drainage.  The skin overlying this area was incised, and there was necrotic tissue underneath, this was debrided.  This was a relatively superficial subcutaneous tissue. The deeper tissue was intact.  There was no communication with the sternum that was visible.  The wound was gently probed, and there was a connection to the more superior draining sinus.  This whole area was opened up as was the upper portion of the wound where there was a large undermined area from where the incision had initially drained. The necrotic tissue was debrided.  Again, there was no sign of communication with the deeper structures.  The wound was copiously irrigated with saline.  A VAC was placed and placed on suction.  We will plan to return to the OR on Friday, May 13, 2017, for additional debridement and VAC change.     Revonda Standard Roxan Hockey, M.D.     SCH/MEDQ  D:  05/11/2017  T:  05/11/2017  Job:  [id number]

## 2017-05-11 NOTE — Progress Notes (Signed)
PHARMACY CONSULT NOTE FOR:  OUTPATIENT  PARENTERAL ANTIBIOTIC THERAPY (OPAT)  Indication: osteomyelitis Regimen: ceftriaxone IV Q24 hours End date: 06/15/17 (6 weeks)  IV antibiotic discharge orders are pended. To discharging provider:  please sign these orders via discharge navigator,  Select New Orders & click on the button choice - Manage This Unsigned Work.     Thank you for allowing pharmacy to be a part of this patient's care.  Jodean Lima Aleyssa Pike 05/11/2017, 5:37 PM

## 2017-05-11 NOTE — Anesthesia Postprocedure Evaluation (Signed)
Anesthesia Post Note  Patient: Darin Simmons  Procedure(s) Performed: Procedure(s) (LRB): STERNAL WOUND IRRIGATION AND DEBRIDEMENT (N/A)     Patient location during evaluation: PACU Anesthesia Type: General Level of consciousness: awake and alert and patient cooperative Pain management: pain level controlled Vital Signs Assessment: post-procedure vital signs reviewed and stable Respiratory status: spontaneous breathing and respiratory function stable Cardiovascular status: stable Anesthetic complications: no    Last Vitals:  Vitals:   05/11/17 1000 05/11/17 1224  BP:  (!) 109/51  Pulse: 62 60  Resp: 12 18  Temp: 36.6 C 36.8 C    Last Pain:  Vitals:   05/11/17 1224  TempSrc: Oral  PainSc:                  [place] S

## 2017-05-11 NOTE — Progress Notes (Signed)
Patient returned to room from OR with foley catheter and sternal wound vac at 125 mmHg. Patient pain free and placed on central cardiac monitor.  CCMD called.  Pt resting with call bell within reach.  Will continue to monitor. Educated patient on negative pressure therapy.  Payton Emerald, RN

## 2017-05-12 LAB — CBC WITH DIFFERENTIAL/PLATELET
BASOS ABS: 0 10*3/uL (ref 0.0–0.1)
BASOS PCT: 1 %
EOS ABS: 0.1 10*3/uL (ref 0.0–0.7)
Eosinophils Relative: 2 %
HEMATOCRIT: 30.6 % — AB (ref 39.0–52.0)
Hemoglobin: 9.8 g/dL — ABNORMAL LOW (ref 13.0–17.0)
Lymphocytes Relative: 18 %
Lymphs Abs: 1.2 10*3/uL (ref 0.7–4.0)
MCH: 29.3 pg (ref 26.0–34.0)
MCHC: 32 g/dL (ref 30.0–36.0)
MCV: 91.6 fL (ref 78.0–100.0)
MONOS PCT: 6 %
Monocytes Absolute: 0.4 10*3/uL (ref 0.1–1.0)
NEUTROS PCT: 73 %
Neutro Abs: 5 10*3/uL (ref 1.7–7.7)
Platelets: 336 10*3/uL (ref 150–400)
RBC: 3.34 MIL/uL — AB (ref 4.22–5.81)
RDW: 13 % (ref 11.5–15.5)
WBC: 6.7 10*3/uL (ref 4.0–10.5)

## 2017-05-12 LAB — CREATININE, SERUM
CREATININE: 0.68 mg/dL (ref 0.61–1.24)
GFR calc non Af Amer: >60 mL/min (ref >60)

## 2017-05-12 NOTE — Care Management Note (Signed)
Case Management Note Marvetta Gibbons RN, BSN Unit 2W-Case Manager [phone]  Patient Details  Name: Darin Simmons MRN: [id number] Date of Birth: 08/14/1941  Subjective/Objective:  Pt admitted with sternal wound infection- s/p I&D on 6/20 with wound VAC placed                  Action/Plan: PTA pt lived at home with wife- per ID pt will need 6 weeks of IV abx- spoke with pt at bedside to discuss d/c needs- and HH needs- choice offered for [facility] agencies- pt does not have a preference as long as insurance works them them- is ok using [place]- call made to [place] with [facility] for home IV abx needs- and potential home wound VAC drsg needs- pt will need PICC placed prior to discharge- plan to return to OR on 6/22 for further wound care and VAC change- have placed [facility] wound VAC form on shadow chart for signature if home wound VAC needed- msg left for Rickie with [facility].  Pt will need HH orders for discharge   Expected Discharge Date:                  Expected Discharge Plan:  [place]  In-House Referral:     Discharge planning Services  CM Consult  Post Acute Care Choice:  Home Health, Durable Medical Equipment Choice offered to:  Patient  DME Arranged:  Vac DME Agency:  [facility]  HH Arranged:  RN, IV Antibiotics HH Agency:  [facility]  Status of Service:  In process, will continue to follow  If discussed at Long Length of Stay Meetings, dates discussed:    Discharge Disposition: home with home health   Additional Comments:  Dawayne Patricia, RN 05/12/2017, 12:19 PM

## 2017-05-12 NOTE — Progress Notes (Signed)
[facility]  Mr. Beranek is a new pt for [facility] this hospital admission.  [facility] will provide [facility] services for home IV ABX upon DC home.   If patient discharges after hours, please call [phone].   Larry Sierras 05/12/2017, 4:25 PM

## 2017-05-12 NOTE — Anesthesia Preprocedure Evaluation (Addendum)
Anesthesia Evaluation  Patient identified by MRN, date of birth, ID band Patient awake    Reviewed: Allergy & Precautions, H&P , NPO status , Patient's Chart, lab work & pertinent test results  Airway Mallampati: I  TM Distance: >3 FB Neck ROM: Full    Dental no notable dental hx. (+) Edentulous Upper, Dental Advisory Given   Pulmonary neg pulmonary ROS, former smoker,    Pulmonary exam normal breath sounds clear to auscultation       Cardiovascular Exercise Tolerance: Good + CAD, + Past MI and + Peripheral Vascular Disease   Rhythm:Regular Rate:Normal     Neuro/Psych negative neurological ROS  negative psych ROS   GI/Hepatic negative GI ROS, Neg liver ROS,   Endo/Other  negative endocrine ROS  Renal/GU negative Renal ROS  negative genitourinary   Musculoskeletal  (+) Arthritis , Osteoarthritis,    Abdominal   Peds  Hematology negative hematology ROS (+)   Anesthesia Other Findings   Reproductive/Obstetrics negative OB ROS                            Anesthesia Physical Anesthesia Plan  ASA: III  Anesthesia Plan: MAC   Post-op Pain Management:    Induction: Intravenous  PONV Risk Score and Plan: 2 and Ondansetron, Dexamethasone and Propofol  Airway Management Planned: Simple Face Mask  Additional Equipment:   Intra-op Plan:   Post-operative Plan:   Informed Consent: I have reviewed the patients History and Physical, chart, labs and discussed the procedure including the risks, benefits and alternatives for the proposed anesthesia with the patient or authorized representative who has indicated his/her understanding and acceptance.   Dental advisory given  Plan Discussed with: CRNA  Anesthesia Plan Comments:         Anesthesia Quick Evaluation

## 2017-05-12 NOTE — Progress Notes (Addendum)
      [address]       [place], [address]             [phone]      1 Day Post-Op Procedure(s) (LRB): STERNAL WOUND IRRIGATION AND DEBRIDEMENT (N/A) Subjective: Had the best night sleep and feeling well.   Objective: Vital signs in last 24 hours: Temp:  [97.7 F (36.5 C)-98.9 F (37.2 C)] 98.9 F (37.2 C) (06/21 0401) Pulse Rate:  [57-65] 57 (06/21 0401) Cardiac Rhythm: Sinus bradycardia (06/21 0253) Resp:  [12-18] 18 (06/21 0401) BP: (106-134)/(51-79) 106/79 (06/21 0401) SpO2:  [97 %-99 %] 98 % (06/21 0401)     Intake/Output from previous day: 06/20 0701 - 06/21 0700 In: 1270 [P.O.:720; I.V.:500; IV Piggyback:50] Out: 260 [Urine:250; Blood:10] Intake/Output this shift: No intake/output data recorded.  General appearance: alert, cooperative and no distress Heart: regular rate and rhythm, S1, S2 normal, no murmur, click, rub or gallop Lungs: clear to auscultation bilaterally Abdomen: soft, non-tender; bowel sounds normal; no masses,  no organomegaly Extremities: small amount of pedal edema L > R Wound: wound vac with good suction  Lab Results: No results for input(s): WBC, HGB, HCT, PLT in the last 72 hours. BMET:  Recent Labs  05/10/17 0808 05/12/17 0358  NA 139  --   K 3.7  --   CL 105  --   CO2 25  --   GLUCOSE 113*  --   BUN 11  --   CREATININE 0.62 0.68  CALCIUM 8.8*  --     PT/INR: No results for input(s): LABPROT, INR in the last 72 hours. ABG    Component Value Date/Time   PHART 7.352 04/22/2017 1723   HCO3 21.3 04/22/2017 1723   TCO2 26 04/23/2017 1716   ACIDBASEDEF 4.0 (H) 04/22/2017 1723   O2SAT 98.0 04/22/2017 1723   CBG (last 3)  No results for input(s): GLUCAP in the last 72 hours.  Assessment/Plan: S/P Procedure(s) (LRB): STERNAL WOUND IRRIGATION AND DEBRIDEMENT (N/A)  1. Wound vac in place with good suction. Next change Friday-will plan to debride and change vac in the OR.  2. Bradycardic early this  morning as low as 30s. Not on a BB, now rate has increased to 60s.  3. Continue Rocephin IV per ID  Plan: monitor bradycardia closely-the patient was asymptomatic, wound vac with good suction, continue antibiotics.    LOS: 7 days    Elgie Collard 05/12/2017 Patient seen and examined, agree with above Will take back to OR for VAC change possible debridement   Remo Lipps C. Roxan Hockey, MD [facility] [phone]

## 2017-05-13 ENCOUNTER — Encounter (HOSPITAL_COMMUNITY): Payer: Self-pay | Admitting: Certified Registered Nurse Anesthetist

## 2017-05-13 ENCOUNTER — Inpatient Hospital Stay (HOSPITAL_COMMUNITY): Payer: Medicare Other | Admitting: Certified Registered Nurse Anesthetist

## 2017-05-13 ENCOUNTER — Encounter (HOSPITAL_COMMUNITY)
Admission: AD | Disposition: A | Discharge status: Home / Self Care | Payer: Self-pay | Source: Ambulatory Visit | Attending: Surgery

## 2017-05-13 DIAGNOSIS — I9789 Other postprocedural complications and disorders of the circulatory system, not elsewhere classified: Secondary | ICD-10-CM

## 2017-05-13 HISTORY — PX: APPLICATION OF WOUND VAC: SHX5189

## 2017-05-13 LAB — AEROBIC CULTURE  (SUPERFICIAL SPECIMEN)

## 2017-05-13 LAB — AEROBIC CULTURE W GRAM STAIN (SUPERFICIAL SPECIMEN)

## 2017-05-13 SURGERY — APPLICATION, WOUND VAC
Anesthesia: Monitor Anesthesia Care

## 2017-05-13 MED ORDER — EPHEDRINE SULFATE 50 MG/ML IJ SOLN
INTRAMUSCULAR | Status: DC | PRN
  Administered 2017-05-13 (×2): 5 mg via INTRAVENOUS

## 2017-05-13 MED ORDER — ONDANSETRON HCL 4 MG/2ML IJ SOLN
INTRAMUSCULAR | Status: AC
  Filled 2017-05-13: qty 2

## 2017-05-13 MED ORDER — PROPOFOL 10 MG/ML IV BOLUS
INTRAVENOUS | Status: AC
  Filled 2017-05-13: qty 20

## 2017-05-13 MED ORDER — FENTANYL CITRATE (PF) 100 MCG/2ML IJ SOLN
INTRAMUSCULAR | Status: DC | PRN
  Administered 2017-05-13: 25 ug via INTRAVENOUS
  Administered 2017-05-13: 50 ug via INTRAVENOUS
  Administered 2017-05-13: 25 ug via INTRAVENOUS

## 2017-05-13 MED ORDER — PROPOFOL 500 MG/50ML IV EMUL
INTRAVENOUS | Status: DC | PRN
  Administered 2017-05-13: 50 ug/kg/min via INTRAVENOUS

## 2017-05-13 MED ORDER — LIDOCAINE 2% (20 MG/ML) 5 ML SYRINGE
INTRAMUSCULAR | Status: AC
  Filled 2017-05-13: qty 5

## 2017-05-13 MED ORDER — TRAMADOL HCL 50 MG PO TABS
50.0000 mg | ORAL_TABLET | ORAL | Status: DC
  Filled 2017-05-13: qty 1

## 2017-05-13 MED ORDER — FENTANYL CITRATE (PF) 250 MCG/5ML IJ SOLN
INTRAMUSCULAR | Status: AC
Start: 2017-05-13 — End: ?
  Filled 2017-05-13: qty 5

## 2017-05-13 MED ORDER — EPHEDRINE 5 MG/ML INJ
INTRAVENOUS | Status: AC
  Filled 2017-05-13: qty 10

## 2017-05-13 MED ORDER — ONDANSETRON HCL 4 MG/2ML IJ SOLN
INTRAMUSCULAR | Status: DC | PRN
  Administered 2017-05-13: 4 mg via INTRAVENOUS

## 2017-05-13 MED ORDER — LACTATED RINGERS IV SOLN
INTRAVENOUS | Status: DC | PRN
  Administered 2017-05-13: 09:00:00 via INTRAVENOUS

## 2017-05-13 MED ORDER — MIDAZOLAM HCL 2 MG/2ML IJ SOLN
INTRAMUSCULAR | Status: AC
  Filled 2017-05-13: qty 2

## 2017-05-13 MED ORDER — SODIUM CHLORIDE 0.9 % IR SOLN
Status: DC | PRN
  Administered 2017-05-13: 1000 mL

## 2017-05-13 MED ORDER — LIDOCAINE 2% (20 MG/ML) 5 ML SYRINGE
INTRAMUSCULAR | Status: DC | PRN
  Administered 2017-05-13: 40 mg via INTRAVENOUS

## 2017-05-13 MED ORDER — MIDAZOLAM HCL 5 MG/5ML IJ SOLN
INTRAMUSCULAR | Status: DC | PRN
  Administered 2017-05-13 (×2): 1 mg via INTRAVENOUS

## 2017-05-13 MED ORDER — TRAMADOL HCL 50 MG PO TABS
50.0000 mg | ORAL_TABLET | ORAL | Status: DC | PRN
  Administered 2017-05-13 – 2017-05-14 (×3): 50 mg via ORAL
  Filled 2017-05-13 (×3): qty 1

## 2017-05-13 SURGICAL SUPPLY — 58 items
BENZOIN TINCTURE PRP APPL 2/3 (GAUZE/BANDAGES/DRESSINGS) IMPLANT
BLADE SURG 10 STRL SS (BLADE) IMPLANT
BLADE SURG 15 STRL LF DISP TIS (BLADE) IMPLANT
BLADE SURG 15 STRL SS (BLADE)
BNDG GAUZE ELAST 4 BULKY (GAUZE/BANDAGES/DRESSINGS) IMPLANT
CANISTER SUCT 3000ML PPV (MISCELLANEOUS) ×3 IMPLANT
CATH THORACIC 28FR RT ANG (CATHETERS) IMPLANT
CATH THORACIC 36FR (CATHETERS) IMPLANT
CATH THORACIC 36FR RT ANG (CATHETERS) IMPLANT
CLIP TI WIDE RED SMALL 24 (CLIP) IMPLANT
CONT SPEC 4OZ CLIKSEAL STRL BL (MISCELLANEOUS) IMPLANT
COVER SURGICAL LIGHT HANDLE (MISCELLANEOUS) ×3 IMPLANT
DRAPE LAPAROSCOPIC ABDOMINAL (DRAPES) ×3 IMPLANT
DRAPE SLUSH/WARMER DISC (DRAPES) IMPLANT
DRSG AQUACEL AG ADV 3.5X14 (GAUZE/BANDAGES/DRESSINGS) IMPLANT
DRSG PAD ABDOMINAL 8X10 ST (GAUZE/BANDAGES/DRESSINGS) IMPLANT
DRSG VAC ATS SM SENSATRAC (GAUZE/BANDAGES/DRESSINGS) ×3 IMPLANT
DRSG VERSA FOAM LRG 10X15 (GAUZE/BANDAGES/DRESSINGS) ×3 IMPLANT
ELECT REM PT RETURN 9FT ADLT (ELECTROSURGICAL) ×3
ELECTRODE REM PT RTRN 9FT ADLT (ELECTROSURGICAL) ×1 IMPLANT
GAUZE SPONGE 4X4 12PLY STRL (GAUZE/BANDAGES/DRESSINGS) IMPLANT
GAUZE XEROFORM 5X9 LF (GAUZE/BANDAGES/DRESSINGS) IMPLANT
GLOVE BIO SURGEON STRL SZ7.5 (GLOVE) IMPLANT
GLOVE SURG SIGNA 7.5 PF LTX (GLOVE) ×3 IMPLANT
GOWN STRL REUS W/ TWL LRG LVL3 (GOWN DISPOSABLE) IMPLANT
GOWN STRL REUS W/ TWL XL LVL3 (GOWN DISPOSABLE) ×2 IMPLANT
GOWN STRL REUS W/TWL LRG LVL3 (GOWN DISPOSABLE)
GOWN STRL REUS W/TWL XL LVL3 (GOWN DISPOSABLE) ×4
HANDPIECE INTERPULSE COAX TIP (DISPOSABLE)
HEMOSTAT POWDER SURGIFOAM 1G (HEMOSTASIS) IMPLANT
HEMOSTAT SURGICEL 2X14 (HEMOSTASIS) IMPLANT
KIT BASIN OR (CUSTOM PROCEDURE TRAY) ×3 IMPLANT
KIT ROOM TURNOVER OR (KITS) ×3 IMPLANT
KIT SUCTION CATH 14FR (SUCTIONS) IMPLANT
NS IRRIG 1000ML POUR BTL (IV SOLUTION) ×3 IMPLANT
PACK CHEST (CUSTOM PROCEDURE TRAY) IMPLANT
PACK GENERAL/GYN (CUSTOM PROCEDURE TRAY) ×3 IMPLANT
PAD ARMBOARD 7.5X6 YLW CONV (MISCELLANEOUS) ×6 IMPLANT
SET HNDPC FAN SPRY TIP SCT (DISPOSABLE) IMPLANT
SPONGE LAP 18X18 X RAY DECT (DISPOSABLE) ×3 IMPLANT
STAPLER VISISTAT 35W (STAPLE) IMPLANT
STRAP MONTGOMERY 1.25X11-1/8 (MISCELLANEOUS) IMPLANT
SUT ETHILON 3 0 FSL (SUTURE) IMPLANT
SUT STEEL 6MS V (SUTURE) IMPLANT
SUT STEEL STERNAL CCS#1 18IN (SUTURE) IMPLANT
SUT STEEL SZ 6 DBL 3X14 BALL (SUTURE) IMPLANT
SUT VIC AB 1 CTX 36 (SUTURE)
SUT VIC AB 1 CTX36XBRD ANBCTR (SUTURE) IMPLANT
SUT VIC AB 2-0 CTX 27 (SUTURE) IMPLANT
SUT VIC AB 3-0 X1 27 (SUTURE) IMPLANT
SWAB COLLECTION DEVICE MRSA (MISCELLANEOUS) IMPLANT
SWAB CULTURE ESWAB REG 1ML (MISCELLANEOUS) IMPLANT
SYR 5ML LL (SYRINGE) IMPLANT
TOWEL OR 17X24 6PK STRL BLUE (TOWEL DISPOSABLE) ×3 IMPLANT
TOWEL OR 17X26 10 PK STRL BLUE (TOWEL DISPOSABLE) ×3 IMPLANT
TRAY FOLEY W/METER SILVER 16FR (SET/KITS/TRAYS/PACK) IMPLANT
WATER STERILE IRR 1000ML POUR (IV SOLUTION) ×3 IMPLANT
WND VAC CANISTER 500ML (MISCELLANEOUS) ×3 IMPLANT

## 2017-05-13 NOTE — Progress Notes (Signed)
Heart rate went up to 156, checked on the patient, patient stated he felt fine and that it happens like that sometimes. The rate soon dropped down to the 70's. MD on call notified. MD ordered a one time dose of 25 mg Metoprolol. Patient educated on the medication, however he refused the medication because his blood pressure "fluctuates". MD on call notified of his refusal. No new orders at this time, will continue to monitor.

## 2017-05-13 NOTE — Progress Notes (Addendum)
Spoke w Jadene Pierini, requested to fill out and sign wound VAC order on front of chart. Order signed. Measurements of wound needed. Spoke with Dr Roxan Hockey, he will provide them in note. Liliane Bade w [facility] wound VAC will initiate insurance auth tonight for [facility]. Signed order on front of chart. Faxed to [place] aware of referral for [facility] RN with [facility] dressing changes and HH IV Abx. Carolynn Sayers following case. Updated Pam that Dr Storm Frisk note for home IV abx and care in chart.

## 2017-05-13 NOTE — Progress Notes (Signed)
[facility]  Mr. Urwin is a new pt with [facility]. [facility] will provide [facility] pharmacy service for home IV ABX at DC as ordered.  I spoke with the pt last PM regarding POC for home IV ABX.  Pt was anxious about this for home but reassured we would teach him and a friend or family member to support at home. I spoke with the pt today around 430 PM to advise that I was on my way.  Pt requested that I not teach him but to call his friends Engineer, technical sales and Blaine Hamper.  Blaine Hamper is patients [place]. I spoke with Mr. And Mrs. Erasmo Score and they will talk with the pt tomorrow am. They requested that I not meet in with the pt tonight as planned. Peggy did ask about SNF options or possible paying privately for an RN to administer his IV ABX doses on the days when [facility] RN not visiting.  [facility] RN will see pt typically Monday , Wednesday and Fridays while VAC in place.  Relayed above conversations and interactions to Carles Collet, RN Case Freight forwarder.   [facility] will be prepared to support weekend DC home if pt, and caregivers as well as hospital staff are to this DC plan. If patient discharges after hours, please call [phone].   Larry Sierras 05/13/2017, 11:31 PM

## 2017-05-13 NOTE — Care Management Note (Signed)
Case Management Note  Patient Details  Name: NESHAWN AIRD MRN: [id number] Date of Birth: Apr 14, 1941  Subjective/Objective:                 Spoke with patient at the bedside. Very anxious. Would like home planning to be managed by friend/ POA Peggy and her spouse Rush Landmark. They would patient to consider SNF and will talk to him about that tonight. Patient has boarder who is homeless, not sure if he will be agreeable to SNF. STill needs midline placed and teachimg and home VAC delivered to room if he were to DC to home.   Name Relation Home Work [place] Denman George [phone]  [phone]      Action/Plan:  CM will continue to follow.  Expected Discharge Date:                  Expected Discharge Plan:  [place]  In-House Referral:     Discharge planning Services  CM Consult  Post Acute Care Choice:  Home Health, Durable Medical Equipment Choice offered to:  Patient  DME Arranged:  Vac DME Agency:  [facility]  HH Arranged:  RN, IV Antibiotics HH Agency:  [facility]  Status of Service:  In process, will continue to follow  If discussed at Long Length of Stay Meetings, dates discussed:    Additional Comments:  Carles Collet, RN 05/13/2017, 5:08 PM

## 2017-05-13 NOTE — Anesthesia Procedure Notes (Signed)
Procedure Name: MAC Date/Time: 05/13/2017 9:50 AM Performed by: Candis Shine Pre-anesthesia Checklist: Patient identified, Emergency Drugs available, Suction available, Patient being monitored and Timeout performed Patient Re-evaluated:Patient Re-evaluated prior to inductionOxygen Delivery Method: Simple face mask Dental Injury: Teeth and Oropharynx as per pre-operative assessment

## 2017-05-13 NOTE — Transfer of Care (Signed)
Immediate Anesthesia Transfer of Care Note  Patient: Darin Simmons  Procedure(s) Performed: Procedure(s): WOUND VAC CHANGE (N/A)  Patient Location: PACU  Anesthesia Type:MAC  Level of Consciousness: awake, alert  and oriented  Airway & Oxygen Therapy: Patient Spontanous Breathing  Post-op Assessment: Report given to RN and Post -op Vital signs reviewed and stable  Post vital signs: Reviewed and stable  Last Vitals:  Vitals:   05/12/17 2117 05/13/17 0418  BP: (!) 106/56 (!) 116/47  Pulse: (!) 57 (!) 58  Resp: 18 19  Temp: 36.9 C 36.7 C    Last Pain:  Vitals:   05/13/17 0418  TempSrc: Oral  PainSc:       Patients Stated Pain Goal: 0 (41/74/08 1448)  Complications: No apparent anesthesia complications

## 2017-05-13 NOTE — Anesthesia Postprocedure Evaluation (Signed)
Anesthesia Post Note  Patient: Darin Simmons  Procedure(s) Performed: Procedure(s) (LRB): WOUND VAC CHANGE (N/A)     Patient location during evaluation: PACU Anesthesia Type: MAC Level of consciousness: awake and alert Pain management: pain level controlled Vital Signs Assessment: post-procedure vital signs reviewed and stable Respiratory status: spontaneous breathing, nonlabored ventilation and respiratory function stable Cardiovascular status: stable and blood pressure returned to baseline Anesthetic complications: no    Last Vitals:  Vitals:   05/13/17 1045 05/13/17 1050  BP: (!) 111/57 (!) 111/57  Pulse: (!) 57 71  Resp: 12 (!) 24  Temp:  36.5 C    Last Pain:  Vitals:   05/13/17 1050  TempSrc:   PainSc: 0-No pain                 Marlow Berenguer,W. EDMOND

## 2017-05-13 NOTE — Progress Notes (Signed)
[facility] for Infectious Disease    Date of Admission:  05/05/2017   Total days of antibiotics 10        Day 9 ceftriaxone           ID: Darin Simmons is a 76 y.o. 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 Underwent wound vac exchange without difficulty  Medications:  . aspirin EC  325 mg Oral Daily  . docusate sodium  100 mg Oral QHS  . enoxaparin (LOVENOX) injection  40 mg Subcutaneous Q24H  . Melatonin  3 mg Oral QHS  . senna-docusate  1 tablet Oral BID  . sodium chloride flush  3 mL Intravenous Q12H  . sodium chloride flush  3 mL Intravenous Q12H    Objective: Vital signs in last 24 hours: Temp:  [97.7 F (36.5 C)-98.4 F (36.9 C)] 97.7 F (36.5 C) (06/22 1230) Pulse Rate:  [57-73] 57 (06/22 1230) Resp:  [12-24] 18 (06/22 1230) BP: (102-129)/(47-66) 129/55 (06/22 1230) SpO2:  [97 %-99 %] 98 % (06/22 1230) Weight:  [138 lb 11.2 oz (62.9 kg)] 138 lb 11.2 oz (62.9 kg) (06/22 0418) Physical Exam  Constitutional: He is oriented to person, place, and time. He appears well-developed and well-nourished. No distress.  HENT:  Mouth/Throat: Oropharynx is clear and moist. No oropharyngeal exudate.  Cardiovascular: Normal rate, regular rhythm and normal heart sounds. Exam reveals no gallop and no friction rub.  No murmur heard.  Pulmonary/Chest: Effort normal and breath sounds normal. No respiratory distress. He has no wheezes.  Chest wall = wound vac in place mid line. Slight erythema at base    Lab Results  Recent Labs  05/12/17 0358 05/12/17 1623  WBC  --  6.7  HGB  --  9.8*  HCT  --  30.6*  CREATININE 0.68  --    No results found for: [place], [place] Microbiology: 6/20 OR tissue cx = few serratia 6/14 blood cx = serratia Studies/Results: No results found.   Assessment/Plan: Serratia sternal osteo  = will treat with 42 days of ceftriaxone 2gm Iv daily currently on day 10 of treatment course. -will get midline for admin of abtx - home health on board - will check sed rate and crp  Dr Megan Salon available for questions. I will see patient on Monday  Diagnosis: Sternal osteo  Culture Result: serratia  Allergies  Allergen Reactions  . Bee Venom Shortness Of Breath and Swelling  . Crestor [Rosuvastatin] Other (See Comments)    MYALGIAS  . Diphenhydramine Hcl Other (See Comments)    Shaky legs  . Statins Other (See Comments)    MYALGIAS MUSCLE PAIN    OPAT Orders Discharge antibiotics: Per pharmacy protocol - ceftriaxone  Duration: 42 days End Date: Jul 25  [place] Per Protocol:  Labs weekly while on IV antibiotics: _x_ CBC with differential _x_ BMP  __ CRP __ ESR   _x_ Please pull PIC at completion of IV antibiotics __ Please leave PIC in place until doctor has seen patient or been notified  Fax weekly labs to [phone]  Clinic Follow Up Appt: 3-4 wk  @    [facility] Cell: [phone] Pager: [phone]  05/13/2017, 2:49 PM

## 2017-05-13 NOTE — Progress Notes (Signed)
      [address]       [place] [address]             [phone]      Wound dimensions  18 cm long, 8 mm wide, 1 cm deep  Remo Lipps C. Roxan Hockey, MD [facility] [phone]

## 2017-05-13 NOTE — Op Note (Signed)
NAMEGEOGE, LAWRANCE NO.:  [id number]  MEDICAL RECORD NO.:  [id number]  LOCATION:  2W19C                        FACILITY:  [facility]  PHYSICIAN:  Revonda Standard. Roxan Hockey, M.D.DATE OF BIRTH:  05/12/1941  DATE OF PROCEDURE:  05/13/2017 DATE OF DISCHARGE:                              OPERATIVE REPORT   PREOPERATIVE DIAGNOSIS:  Sternal wound infection.  POSTOPERATIVE DIAGNOSIS:  Sternal wound infection.  PROCEDURE:  VAC change and sternal wound debridement under intravenous sedation.  SURGEON:  Revonda Standard. Roxan Hockey, M.D.  ASSISTANT:  None.  ANESTHESIA:  Intravenous sedation.  FINDINGS:  95% of wound granulating small amount of necrotic tissue debrided.  No definite connection to the underlying bone.  CLINICAL NOTE:  Darin Simmons is a 76 year old gentleman, who has had an aortic valve replacement recently and presented back with a sternal wound infection, which grew Serratia.  Despite adequate antibiotic treatment, he did not have improvement in the drainage or appearance of the wound.  He was taken to the operating room on May 11, 2017, and the wound was opened, debrided, and a wound VAC was placed.  He now is to return to the OR for change of the VAC dressing and to see if additional debridement is necessary.  OPERATIVE NOTE:  Darin Simmons was brought to the operating room on May 13, 2017.  He was given intravenous sedation and monitored by the Anesthesia Service.  The dressing and superficial sponge from the wound VAC were removed.  The chest was prepped and draped in the usual sterile fashion.  After performing a time-out, the deep dressing was removed. The underlying tissue was granulating over approximately 95% of the surface.  There was some minimal necrotic tissue in the midportion of the wound at 2 separate sites that was debrided.  There was no connection to the deeper tissues that could be visualized.  The wound was copiously irrigated with saline.  A  new sponge was trimmed to fit the incision.  A sponge was placed over that superficially.  The dressing was applied and vacuum was restored.  The patient was taken to the postanesthetic care unit in good condition.    Revonda Standard Roxan Hockey, M.D.    SCH/MEDQ  D:  05/13/2017  T:  05/13/2017  Job:  [id number]

## 2017-05-13 NOTE — Care Management Important Message (Signed)
Important Message  Patient Details  Name: Darin Simmons MRN: [id number] Date of Birth: 25-Jul-1941   Medicare Important Message Given:  Yes    [name] 05/13/2017, 12:12 PM

## 2017-05-14 ENCOUNTER — Encounter (HOSPITAL_COMMUNITY): Payer: Self-pay | Admitting: Thoracic Surgery (Cardiothoracic Vascular Surgery)

## 2017-05-14 DIAGNOSIS — I472 Ventricular tachycardia: Secondary | ICD-10-CM

## 2017-05-14 DIAGNOSIS — I495 Sick sinus syndrome: Secondary | ICD-10-CM

## 2017-05-14 MED ORDER — SODIUM CHLORIDE 0.9% FLUSH
10.0000 mL | INTRAVENOUS | Status: DC | PRN
  Administered 2017-05-15: 10 mL
  Filled 2017-05-14: qty 40

## 2017-05-14 MED ORDER — TRAMADOL HCL 50 MG PO TABS: 50.0000 mg | ORAL_TABLET | Freq: Four times a day (QID) | ORAL | 0 refills | Status: DC | PRN

## 2017-05-14 MED ORDER — DEXTROSE 5 % IV SOLN: 2.0000 g | INTRAVENOUS | 0 refills | Status: AC

## 2017-05-14 NOTE — Progress Notes (Addendum)
Spoke with [facility], they approved VAC, it will be delivered to room today around 2pm.

## 2017-05-14 NOTE — Progress Notes (Signed)
Peripherally Inserted Central Catheter/Midline Placement  The IV Nurse has discussed with the patient and/or persons authorized to consent for the patient, the purpose of this procedure and the potential benefits and risks involved with this procedure.  The benefits include less needle sticks, lab draws from the catheter, and the patient may be discharged home with the catheter. Risks include, but not limited to, infection, bleeding, blood clot (thrombus formation), and puncture of an artery; nerve damage and irregular heartbeat and possibility to perform a PICC exchange if needed/ordered by physician.  Alternatives to this procedure were also discussed.  [facility] [employer] PICC patient education guide, fact sheet on infection prevention and patient information card has been provided to patient /or left at bedside.    PICC/Midline Placement Documentation  PICC Single Lumen 05/14/17 PICC Right Brachial 42 cm 0 cm (Active)  Indication for Insertion or Continuance of Line Home intravenous therapies (PICC only) 05/14/2017  1:25 PM  Exposed Catheter (cm) 0 cm 05/14/2017  1:25 PM  Site Assessment Clean;Dry;Intact 05/14/2017  1:25 PM  Line Status Blood return noted;Flushed;Saline locked 05/14/2017  1:25 PM  Dressing Type Transparent 05/14/2017  1:25 PM  Dressing Status Clean;Dry;Intact;Antimicrobial disc in place 05/14/2017  1:25 PM  Dressing Intervention New dressing 05/14/2017  1:25 PM  Dressing Change Due 05/21/17 05/14/2017  1:25 PM       Aldona Lento L 05/14/2017, 1:40 PM

## 2017-05-14 NOTE — Discharge Instructions (Signed)
Wound Dehiscence Wound dehiscence is when a surgical incision breaks open and does not heal properly after surgery. It usually happens 7-10 days after surgery. This can be a serious condition. It is important to identify and treat this condition early. What are the causes? Common causes of this condition include:  Stretching of the wound area. This may be caused by lifting, vomiting, violent coughing, or straining during bowel movements.  Wound infection.  Early removal or rupture of stitches (sutures).  What increases the risk? The following factors may make you more likely to develop this condition:  Obesity.  Lung disease.  Smoking.  Poor nutrition.  Contamination during surgery.  Medical problems that make it harder to heal, such as diabetes or autoimmune diseases.  What are the signs or symptoms? Symptoms of this condition include:  Bleeding or drainage from the wound.  Pain.  Fever.  The wound starting to break open.  How is this diagnosed? Your health care provider may diagnose wound dehiscence by monitoring the incision and noting any changes in the wound. These changes can include a gap in the incision and an increase in drainage or pain. The health care provider may ask you if you have noticed any stretching or tearing of the wound. You may also have tests, including:  Wound culture tests to determine if there is an infection.  Imaging studies, such as an MRI scan or CT scan, to determine if there is a collection of pus or fluid in the wound area.  Blood tests to check for infection and inflammation.  How is this treated? Treatment for this condition may include:  Wound care to keep the incision clean and help it heal from the inside out.  Surgical repair.  Antibiotic medicine to treat or prevent infection.  Medicines to reduce pain and swelling.  Follow these instructions at home: Medicines  Take over-the-counter and prescription medicines only  as told by your health care provider. Taking pain medicine 30 minutes before changing a bandage (dressing) can help relieve pain.  If you were prescribed an antibiotic, take it as told by your health care provider. Do not stop taking the antibiotic even if you start to feel better.  Use anti-itch medicine as told by your health care provider. The wound may feel itchy when it is healing. Wound care  Follow instructions from your health care provider about how to take care of your wound. Make sure you: ? Wash your hands with soap and water before you change your bandage (dressing) or wash the wound area. If soap and water are not available, use hand sanitizer. ? Gently wash the wound area with mild soap and water 2 times a day, or as directed. Rinse off the soap. Pat the area dry with a clean towel. Do not rub the wound. ? Change the dressing and the packing inside as told by your health care provider.  Do not scratch or pick at the wound.  Check your wound area every day for signs of infection. Check for: ? More redness, swelling, or pain. ? More fluid or blood. ? Warmth. ? Pus or a bad smell. Activity  Avoid exercises that make you sweat heavily or could cause stretching of your wound.  Do not lift anything that is heavier than 10 lb (4.5 kg) until the wound is healed or until your health care provider says that it is safe. General instructions  Do not take baths, swim, or use a hot tub until your health   care provider approves. You may take showers.  Keep all follow-up visits as told by your health care provider. This is important. Contact a health care provider if:  Your wound does not seem to be healing properly.  You have a fever. Get help right away if:  You have more redness, swelling, or pain around your wound.  You have more fluid coming from your wound.  Your wound feels warm to the touch.  You have pus or a bad smell coming from your wound.  Your wound breaks open  farther.  You have redness streaking or spreading from your wound.  You have excessive bleeding from your wound. Summary  Wound dehiscence is when a surgical incision breaks open and does not heal properly after surgery.  Your health care provider may diagnose wound dehiscence by monitoring the incision and noting any changes in the wound.  Follow instructions from your health care provider about how to take care of your wound.  Check your wound area every day for signs of infection, such as redness, swelling, pain, fluid, blood, warmth, pus, or a bad smell.  If you were prescribed antibiotic medicine, take it as told by your health care provider. Do not stop taking the antibiotic even if you start to feel better. This information is not intended to replace advice given to you by your health care provider. Make sure you discuss any questions you have with your health care provider. Document Released: 01/29/2004 Document Revised: 10/13/2016 Document Reviewed: 10/13/2016 Elsevier Interactive Patient Education  2017 [employer]  

## 2017-05-14 NOTE — Progress Notes (Signed)
Arrived to discuss PICC placement. Patient states "I have a lot of information to digest. I would rather have it placed later this evening or tommorrow."  Primary Jeneen Rinks RN notified.

## 2017-05-14 NOTE — Consult Note (Signed)
Cardiology Consultation:   Patient ID: Darin Simmons; [id number]; Jun 10, 1941   Admit date: 05/05/2017 Date of Consult: 05/14/2017  Primary Care Provider: Darcus Austin, MD Primary Cardiologist: Irish Lack Primary Electrophysiologist:  new   Patient Profile:   Darin Simmons is a 76 y.o. male with a hx of aortic regurge, s/p AVR who is being seen today for the evaluation of NSVT and sinus bradycardia at the request of Jadene Pierini, PA-C.  History of Present Illness:   Darin Simmons is an eccentric 76 yo man with a h/o AS, AI, CAD, and dyslipidemia who underwent AVR nearly a month ago. He has developed a sternal wound infection requiring a wound vac. He has some asymptomatic sinus node dysfunction with occaisional daytime HR's in the 40's. The patient has had minimal amounts of palpitations and has had brief NSVT on telemetry for which he is minimally symptomatic. He has preserved LV function by echo. He denies fever or chills. He appears to enjoy sharing his many opinions about many things both medical and non-medical.  Past Medical History:  Diagnosis Date  . 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 Aortic stenosis   . Arthritis   . BPH (benign prostatic hyperplasia)   . CAD (coronary artery disease) 2004  . Dyspnea   . Heart attack ([place]) 2004  . Heart murmur   . Hypercholesterolemia   . 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 PVD (peripheral vascular disease) ([place])   . Wound infection 04/2017    Past Surgical History:  Procedure Laterality Date  . AORTIC VALVE REPLACEMENT N/A 04/22/2017   Procedure: AORTIC VALVE REPLACEMENT (AVR) using Magna Ease size 33mm;  Surgeon: Gaye Pollack, MD;  Location: [place] [facility];  Service: Open Heart Surgery;  Laterality: N/A;  . APPLICATION OF WOUND VAC N/A 05/13/2017   Procedure: WOUND VAC CHANGE;  Surgeon: Melrose Nakayama, MD;  Location: [facility];  Service: Thoracic;  Laterality: N/A;  . DENTAL SURGERY     removed  . hair follicle removed Left   .  heart stent    . HERNIA REPAIR Bilateral 05/15/75   BIH and umbilical hernia repair  . INGUINAL HERNIA REPAIR Bilateral 01/18/2013   Procedure: LAPAROSCOPIC BILATERAL INGUINAL HERNIA REPAIR;  Surgeon: Odis Hollingshead, MD;  Location: [facility];  Service: General;  Laterality: Bilateral;  . INSERTION OF MESH Bilateral 01/18/2013   Procedure: INSERTION OF MESH;  Surgeon: Odis Hollingshead, MD;  Location: [facility];  Service: General;  Laterality: Bilateral;  AND UMBILICUS  . STERNAL WOUND DEBRIDEMENT N/A 05/11/2017   Procedure: STERNAL WOUND IRRIGATION AND DEBRIDEMENT;  Surgeon: Melrose Nakayama, MD;  Location: [facility] [place];  Service: Thoracic;  Laterality: N/A;  . TEE WITHOUT CARDIOVERSION N/A 04/22/2017   Procedure: TRANSESOPHAGEAL ECHOCARDIOGRAM (TEE);  Surgeon: Gaye Pollack, MD;  Location: [facility];  Service: Open Heart Surgery;  Laterality: N/A;  . UMBILICAL HERNIA REPAIR N/A 01/18/2013   Procedure: HERNIA REPAIR UMBILICAL ADULT;  Surgeon: Odis Hollingshead, MD;  Location[facility];  Service: General;  Laterality: N/A;     Inpatient Medications: Scheduled Meds: . aspirin EC  325 mg Oral Daily  . docusate sodium  100 mg Oral QHS  . enoxaparin (LOVENOX) injection  40 mg Subcutaneous Q24H  . Melatonin  3 mg Oral QHS  . senna-docusate  1 tablet Oral BID  . sodium chloride flush  3 mL Intravenous Q12H  . sodium chloride flush  3 mL Intravenous Q12H  Continuous Infusions: . sodium chloride    . cefTRIAXone (ROCEPHIN)  IV Stopped (05/13/17 1630)   PRN Meds: sodium chloride, acetaminophen **OR** acetaminophen, psyllium, sodium chloride flush, traMADol  Allergies:    Allergies  Allergen Reactions  . Bee Venom Shortness Of Breath and Swelling  . Crestor [Rosuvastatin] Other (See Comments)    MYALGIAS  . Diphenhydramine Hcl Other (See Comments)    Shaky legs  . Statins Other (See Comments)    MYALGIAS MUSCLE PAIN    Social History:   Social History   Social History  . Marital status:  Divorced    Spouse name: N/A  . Number of children: N/A  . Years of education: N/A   Occupational History  . Not on file.   Social History Main Topics  . Smoking status: Former Smoker    Packs/day: 1.00    Years: 50.00    Types: Cigarettes    Quit date: 11/22/2008  . Smokeless tobacco: Never Used  . Alcohol use No  . Drug use: No  . Sexual activity: Not on file   Other Topics Concern  . Not on file   Social History Narrative  . No narrative on file    Family History:   The patient's family history includes Heart disease in his father.  ROS:  Please see the history of present illness.  ROS  All other ROS reviewed and negative. He has had some remote toe pain.      Physical Exam/Data:   Vitals:   05/13/17 1053 05/13/17 1230 05/13/17 2029 05/14/17 0538  BP:  (!) 129/55 122/61 (!) 121/57  Pulse: 73 (!) 57 66 63  Resp: (!) 23 18 18 18   Temp:  97.7 F (36.5 C) 98 F (36.7 C) 97.7 F (36.5 C)  TempSrc:  Oral Oral Oral  SpO2: 99% 98% 100% 98%  Weight:    138 lb 14.4 oz (63 kg)  Height:        Intake/Output Summary (Last 24 hours) at 05/14/17 1101 Last data filed at 05/13/17 1800  Gross per 24 hour  Intake              240 ml  Output                0 ml  Net              240 ml   Filed Weights   05/10/17 0500 05/13/17 0418 05/14/17 0538  Weight: 139 lb 8 oz (63.3 kg) 138 lb 11.2 oz (62.9 kg) 138 lb 14.4 oz (63 kg)   Body mass index is 19.93 kg/m.  General:  Well nourished, well developed, in no acute distress HEENT: normal Lymph: no adenopathy Neck: 6 cm JVD Endocrine:  No thryomegaly Vascular: No carotid bruits; FA pulses 2+ bilaterally without bruits  Cardiac:  normal S1, S2; RRR; no murmur  Lungs:  clear to auscultation bilaterally, no wheezing, rhonchi or rales  Abd: soft, nontender, no hepatomegaly  Ext: no edema Musculoskeletal:  No deformities, BUE and BLE strength normal and equal Skin: warm and dry  Neuro:  CNs 2-12 intact, no focal  abnormalities noted Psych:  Normal affect   EKG:  The EKG was personally reviewed and demonstrates:  nsr with no significant conduction disease Telemetry:  Telemetry was personally reviewed and demonstrates:  nsr with sinus bradycardia and NSVT  Relevant CV Studies: 2D echo with preserved LV function  Laboratory Data:  Chemistry Recent Labs Lab 05/10/17 [phone] 05/12/17 0358  NA 139  --   K 3.7  --   CL 105  --   CO2 25  --   GLUCOSE 113*  --   BUN 11  --   CREATININE 0.62 0.68  CALCIUM 8.8*  --   GFRNONAA >60 >60  GFRAA >60 >60  ANIONGAP 9  --     No results for input(s): PROT, ALBUMIN, AST, ALT, ALKPHOS, BILITOT in the last 168 hours. Hematology Recent Labs Lab 05/12/17 1623  WBC 6.7  RBC 3.34*  HGB 9.8*  HCT 30.6*  MCV 91.6  MCH 29.3  MCHC 32.0  RDW 13.0  PLT 336   Cardiac EnzymesNo results for input(s): TROPONINI in the last 168 hours. No results for input(s): TROPIPOC in the last 168 hours.  BNPNo results for input(s): BNP, PROBNP in the last 168 hours.  DDimer No results for input(s): DDIMER in the last 168 hours.  Radiology/Studies:  Ct Chest W Contrast  Result Date: 05/10/2017 CLINICAL DATA:  76 y/o M; aortic valve replacement 04/22/2017. Sternal wound infection with soreness in the sternum. EXAM: CT CHEST WITH CONTRAST TECHNIQUE: Multidetector CT imaging of the chest was performed during intravenous contrast administration. CONTRAST:  48mL ISOVUE-300 IOPAMIDOL (ISOVUE-300) INJECTION 61% COMPARISON:  04/06/2017 chest CT FINDINGS: Cardiovascular: Stable heart size. Moderate to severe coronary artery calcification. RCA stent. Aortic valve replacement. Normal caliber aorta with mild calcific atherosclerosis. Mediastinum/Nodes: No lymphadenopathy. Normal thoracic esophagus. Unremarkable thyroid gland. Lungs/Pleura: Right lung base dependent platelike atelectasis. Mild centrilobular emphysema in the lung apices. No consolidation or pneumothorax. Trace right pleural  effusion. Upper Abdomen: Nonspecific subcentimeter calcification within segment 8 of the liver and tiny hypodense foci probably representing cysts. Musculoskeletal: Approximately 6 mm of dehiscence of the manubrium. The sternum is well apposed. Ill-defined lucency of the bony margins of the manubrium as well as of the superior aspect of the sternum (series 5, image 12). Sternotomy wires are intact. There is soft tissue thickening posterior to the sternotomy without a discrete rim enhancing collection probably representing postsurgical changes. No definite evidence for mediastinum disc. Fat stranding within the chest wall anterior to the sternum and manubrium extending inferiorly into the subxiphoid region, no discrete collection. IMPRESSION: 1. 6 mm dehiscence of the manubrium.  Well opposed sternum. 2. Ill-defined lucency of bony margins of the manubrium as well as superior aspect of sternum may represent osteomyelitis in setting of infection. 3. Mild fat stranding in the chest wall anterior to the sternum and manubrium extending in the subxiphoid region may be related to infection and/or postsurgical changes, no discrete abscess identified. 4. Ill-defined soft tissue subjacent to the sternum in the anterior mediastinum without discrete fluid collection probably representing postsurgical changes. 5. Trace right pleural effusion. Electronically Signed   By: Kristine Garbe M.D.   On: 05/10/2017 15:10    Assessment and Plan:   1. Sinus bradycardia - he is stable. No indication for PPM. Would avoid AV nodal or sinus nodal blocking drugs. 2. NSVT - he is minimally symptomatic. In light of preserved LV function, propensity to hypotension, and sinus bradycardia, would not recommend any medical therapy at this time. 3. Sternal wound infection - appears to be tolerating wound vac.  Darin Simmons,M.D.   Signed, Cristopher Peru, MD  05/14/2017 11:01 AM

## 2017-05-14 NOTE — Progress Notes (Signed)
Patient ID: Darin Simmons, male   DOB: [birth year], 76 y.o.   MRN: [id number]          [facility]    Date of Admission:  05/05/2017   Total days of antibiotics 10        Day 9 ceftriaxone  Darin Simmons is receiving IV ceftriaxone for postoperative sternal wound infection with Serratia bacteremia. He declined PICC placement this morning but indicates that he is willing to consider it later. Please call me for any questions this weekend.         Michel Bickers, MD [facility] [phone] pager   [phone] cell 05/14/2017, 10:57 AM

## 2017-05-14 NOTE — Progress Notes (Addendum)
12:10 [place] compliant message left with contact below at both numbers for callback to assist with DC planning as requested by patient.   Name Relation Home Work [place] [phone]  [phone]    13:10 Spoke with [facility]). They stated that they spoke to patient and he is unwilling to go to SNF. They understand that [facility] will provide all medication and HH RN for VAC changes and IV med administration on MWF. They understand that it will be the patient and or their responsibility to arrange/ pay for IV medication administration through a private provider on days that [facility] is not scheduled to come to the house as they are unable to secure other assistance with this. Provided with [facility] [phone] as option for provider. [place] spoke with Carolynn Sayers of [facility] who will set up [place] to start on Tuesday. Verified with Pam that [facility] able to provide RN for infusions on Sunday and Monday. Spoke with bedside RN Jeneen Rinks, he will administer today's dose prior to discharge.

## 2017-05-14 NOTE — Progress Notes (Addendum)
      [address]       [place] [address]             [phone]      1 Day Post-Op Procedure(s) (LRB): WOUND VAC CHANGE (N/A) Subjective:  overall feels well, primary c/o is back pain  Objective: Vital signs in last 24 hours: Temp:  [97.7 F (36.5 C)-98.2 F (36.8 C)] 97.7 F (36.5 C) (06/23 0538) Pulse Rate:  [57-73] 63 (06/23 0538) Cardiac Rhythm: Normal sinus rhythm (06/22 1915) Resp:  [12-24] 18 (06/23 0538) BP: (102-129)/(55-66) 121/57 (06/23 0538) SpO2:  [97 %-100 %] 98 % (06/23 0538) Weight:  [138 lb 14.4 oz (63 kg)] 138 lb 14.4 oz (63 kg) (06/23 0538)  Hemodynamic parameters for last 24 hours:    Intake/Output from previous day: 06/22 0701 - 06/23 0700 In: 440 [P.O.:240; I.V.:200] Out: 1 [Blood:1] Intake/Output this shift: No intake/output data recorded.  General appearance: alert, cooperative and no distress Heart: regular rate and rhythm and soft systolic murnur Lungs: clear to auscultation bilaterally Abdomen: benign Extremities: no edema Wound: Vac in place without erethema  Lab Results:  Recent Labs  05/12/17 1623  WBC 6.7  HGB 9.8*  HCT 30.6*  PLT 336   BMET:  Recent Labs  05/12/17 0358  CREATININE 0.68    PT/INR: No results for input(s): LABPROT, INR in the last 72 hours. ABG    Component Value Date/Time   PHART 7.352 04/22/2017 1723   HCO3 21.3 04/22/2017 1723   TCO2 26 04/23/2017 1716   ACIDBASEDEF 4.0 (H) 04/22/2017 1723   O2SAT 98.0 04/22/2017 1723   CBG (last 3)  No results for input(s): GLUCAP in the last 72 hours.  Meds Scheduled Meds: . aspirin EC  325 mg Oral Daily  . docusate sodium  100 mg Oral QHS  . enoxaparin (LOVENOX) injection  40 mg Subcutaneous Q24H  . Melatonin  3 mg Oral QHS  . senna-docusate  1 tablet Oral BID  . sodium chloride flush  3 mL Intravenous Q12H  . sodium chloride flush  3 mL Intravenous Q12H   Continuous Infusions: . sodium chloride    . cefTRIAXone (ROCEPHIN)  IV  Stopped (05/13/17 1630)   PRN Meds:.sodium chloride, acetaminophen **OR** acetaminophen, psyllium, sodium chloride flush, traMADol  Xrays No results found.  Assessment/Plan: S/P Procedure(s) (LRB): WOUND VAC CHANGE (N/A)  1 overall stable 2 having some tachy brady on monitor , tolerating clinically but may warrant further evaluation/pharmacological management in setting of new bioprosthetic valve 3 arrangements being made for [facility] care and abx at home   LOS: 9 days    GOLD,WAYNE E 05/14/2017  EP cardiology evaluation - sinus brady avoid beta blocker, min NSVT - no new medication  Will prepare for home IV antibiotics in view of recent bioprosthetic AVR and serratia superficial sternal wound infection

## 2017-05-15 MED ORDER — HEPARIN SOD (PORK) LOCK FLUSH 100 UNIT/ML IV SOLN
250.0000 [IU] | INTRAVENOUS | Status: AC | PRN
  Administered 2017-05-15: 250 [IU]

## 2017-05-15 NOTE — Care Management (Addendum)
Pt has wound vac in the room - nurse will apply prior to discharge [facility] following for IV infusion initiation post discharge and [facility] for wound vac  Per beside nurse - today's IV antibiotic will be given prior to discharge and family member will transport pt home this evening.  Bed side nurse will contact [place] once dose is given today  CM assessed pt, pt alert and oriented during assessment.  Pt will have someone stay with him at night.  Pt is independently ambulatory here in the hospital  - denied additional [facility] or equipment.

## 2017-05-15 NOTE — Progress Notes (Signed)
Discharged to home with family office visits in place teaching done  

## 2017-05-15 NOTE — Progress Notes (Addendum)
      [address]       [place] [address]             [phone]      2 Days Post-Op Procedure(s) (LRB): WOUND VAC CHANGE (N/A) Subjective: Feels well  Objective: Vital signs in last 24 hours: Temp:  [97.8 F (36.6 C)-98.2 F (36.8 C)] 97.8 F (36.6 C) (06/24 0500) Pulse Rate:  [56-65] 56 (06/24 0500) Cardiac Rhythm: Normal sinus rhythm (06/23 1900) Resp:  [17-18] 18 (06/24 0500) BP: (119-124)/(58-60) 119/58 (06/24 0500) SpO2:  [98 %-99 %] 98 % (06/24 0500) Weight:  [137 lb (62.1 kg)] 137 lb (62.1 kg) (06/24 0500)  Hemodynamic parameters for last 24 hours:    Intake/Output from previous day: 06/23 0701 - 06/24 0700 In: 720 [P.O.:720] Out: -  Intake/Output this shift: No intake/output data recorded.  General appearance: alert, cooperative and no distress Heart: regular rate and rhythm Lungs: clear to auscultation bilaterally Abdomen: benign Extremities: minor edema Wound: Vac in place  Lab Results:  Recent Labs  05/12/17 1623  WBC 6.7  HGB 9.8*  HCT 30.6*  PLT 336   BMET: No results for input(s): NA, K, CL, CO2, GLUCOSE, BUN, CREATININE, CALCIUM in the last 72 hours.  PT/INR: No results for input(s): LABPROT, INR in the last 72 hours. ABG    Component Value Date/Time   PHART 7.352 04/22/2017 1723   HCO3 21.3 04/22/2017 1723   TCO2 26 04/23/2017 1716   ACIDBASEDEF 4.0 (H) 04/22/2017 1723   O2SAT 98.0 04/22/2017 1723   CBG (last 3)  No results for input(s): GLUCAP in the last 72 hours.  Meds Scheduled Meds: . aspirin EC  325 mg Oral Daily  . docusate sodium  100 mg Oral QHS  . enoxaparin (LOVENOX) injection  40 mg Subcutaneous Q24H  . Melatonin  3 mg Oral QHS  . senna-docusate  1 tablet Oral BID  . sodium chloride flush  3 mL Intravenous Q12H  . sodium chloride flush  3 mL Intravenous Q12H   Continuous Infusions: . sodium chloride    . cefTRIAXone (ROCEPHIN)  IV Stopped (05/14/17 1730)   PRN Meds:.sodium chloride,  acetaminophen **OR** acetaminophen, psyllium, sodium chloride flush, sodium chloride flush, traMADol  Xrays No results found.  Assessment/Plan: S/P Procedure(s) (LRB): WOUND VAC CHANGE (N/A) Plan for discharge: see discharge orders   LOS: 10 days    Simmons,Darin E 05/15/2017  patient examined and medical record reviewed,agree with above note. Tharon Aquas Trigt III 05/15/2017

## 2017-05-16 ENCOUNTER — Other Ambulatory Visit: Payer: Self-pay | Admitting: Surgery

## 2017-05-16 ENCOUNTER — Encounter (HOSPITAL_COMMUNITY): Payer: Self-pay | Admitting: Thoracic Surgery (Cardiothoracic Vascular Surgery)

## 2017-05-16 DIAGNOSIS — R7881 Bacteremia: Secondary | ICD-10-CM

## 2017-05-16 DIAGNOSIS — E785 Hyperlipidemia, unspecified: Secondary | ICD-10-CM | POA: Diagnosis not present

## 2017-05-16 DIAGNOSIS — A498 Other bacterial infections of unspecified site: Secondary | ICD-10-CM

## 2017-05-16 DIAGNOSIS — Z87891 Personal history of nicotine dependence: Secondary | ICD-10-CM | POA: Diagnosis not present

## 2017-05-16 DIAGNOSIS — I2510 Atherosclerotic heart disease of native coronary artery without angina pectoris: Secondary | ICD-10-CM | POA: Diagnosis not present

## 2017-05-16 DIAGNOSIS — L0390 Cellulitis, unspecified: Secondary | ICD-10-CM

## 2017-05-16 DIAGNOSIS — Z452 Encounter for adjustment and management of vascular access device: Secondary | ICD-10-CM | POA: Diagnosis not present

## 2017-05-16 DIAGNOSIS — M869 Osteomyelitis, unspecified: Secondary | ICD-10-CM

## 2017-05-16 DIAGNOSIS — I739 Peripheral vascular disease, unspecified: Secondary | ICD-10-CM | POA: Diagnosis not present

## 2017-05-16 DIAGNOSIS — Z5181 Encounter for therapeutic drug level monitoring: Secondary | ICD-10-CM | POA: Diagnosis not present

## 2017-05-16 DIAGNOSIS — T8149XA Infection following a procedure, other surgical site, initial encounter: Secondary | ICD-10-CM

## 2017-05-16 DIAGNOSIS — T814XXA Infection following a procedure, initial encounter: Secondary | ICD-10-CM | POA: Diagnosis not present

## 2017-05-16 DIAGNOSIS — M1990 Unspecified osteoarthritis, unspecified site: Secondary | ICD-10-CM | POA: Diagnosis not present

## 2017-05-16 DIAGNOSIS — Z953 Presence of xenogenic heart valve: Secondary | ICD-10-CM

## 2017-05-18 ENCOUNTER — Ambulatory Visit: Payer: Medicare Other

## 2017-05-18 ENCOUNTER — Encounter: Payer: Self-pay | Admitting: Surgery

## 2017-05-18 ENCOUNTER — Ambulatory Visit (INDEPENDENT_AMBULATORY_CARE_PROVIDER_SITE_OTHER): Payer: Self-pay | Admitting: Surgery

## 2017-05-18 ENCOUNTER — Ambulatory Visit
Admission: RE | Admit: 2017-05-18 | Discharge: 2017-05-18 | Disposition: A | Discharge status: Home / Self Care | Payer: Medicare Other | Source: Ambulatory Visit | Attending: Surgery | Admitting: Surgery

## 2017-05-18 VITALS — BP 121/72 | HR 87 | Resp 16 | Ht 70.0 in | Wt 136.0 lb

## 2017-05-18 DIAGNOSIS — Z9889 Other specified postprocedural states: Secondary | ICD-10-CM | POA: Diagnosis not present

## 2017-05-18 DIAGNOSIS — Z09 Encounter for follow-up examination after completed treatment for conditions other than malignant neoplasm: Secondary | ICD-10-CM | POA: Diagnosis not present

## 2017-05-18 DIAGNOSIS — I2510 Atherosclerotic heart disease of native coronary artery without angina pectoris: Secondary | ICD-10-CM | POA: Diagnosis not present

## 2017-05-18 DIAGNOSIS — S21109A Unspecified open wound of unspecified front wall of thorax without penetration into thoracic cavity, initial encounter: Secondary | ICD-10-CM

## 2017-05-18 DIAGNOSIS — I700 Atherosclerosis of aorta: Secondary | ICD-10-CM | POA: Diagnosis not present

## 2017-05-18 DIAGNOSIS — L089 Local infection of the skin and subcutaneous tissue, unspecified: Secondary | ICD-10-CM | POA: Diagnosis not present

## 2017-05-18 DIAGNOSIS — Z952 Presence of prosthetic heart valve: Secondary | ICD-10-CM

## 2017-05-18 DIAGNOSIS — Z953 Presence of xenogenic heart valve: Secondary | ICD-10-CM

## 2017-05-18 DIAGNOSIS — I350 Nonrheumatic aortic (valve) stenosis: Secondary | ICD-10-CM

## 2017-05-18 NOTE — Progress Notes (Signed)
HPI: Patient returns for routine postoperative follow-up having undergone AVR with a 25 mm Edwards pericardial valve on 04/22/2017. The patient's early postoperative recovery while in the hospital was notable for some drainage from the sternal incision that stopped but he returned to the office with a superficial sternal wound infection. This was debrided in the OR by Dr. Roxan Hockey and a VAC was placed on 05/11/2017. The cultures grew Serratia and he was started on Rocephin which he is now getting at home via a PICC line. ID has recommended to continue the antibiotic until July 25 which will be 42 days of treatment since he has a valve in place. He did not have bacteremia or signs of systemic infection.  Since hospital discharge last weeked the patient reports that [facility] nursing has been coming MWF to change the [facility] and administer the antibiotic and then coming the other 4 days to administer the antibiotic only. He says that he feels fine. No fever or chills, eating well.   Current Outpatient Prescriptions  Medication Sig Dispense Refill  . aspirin EC 325 MG tablet Take 1 tablet (325 mg total) by mouth daily.    . cefTRIAXone 2 g in dextrose 5 % 50 mL Inject 2 g into the vein daily. 35 Units 0  . traMADol (ULTRAM) 50 MG tablet Take 1 tablet (50 mg total) by mouth every 6 (six) hours as needed for severe pain. 30 tablet 0  . Hypromellose (ARTIFICIAL TEARS OP) Place 1 drop into both eyes as needed (for dry eyes).     . Magnesium 250 MG TABS Take 1 tablet (250 mg total) by mouth daily. (Patient not taking: Reported on 05/05/2017)  0  . Melatonin 3 MG TABS Take 3 mg by mouth at bedtime.     No current facility-administered medications for this visit.     Physical Exam: BP 121/72 (BP Location: Left Arm, Patient Position: Sitting, Cuff Size: Normal)   Pulse 87   Resp 16   Ht 5\' 10"  (1.778 m)   Wt 136 lb (61.7 kg)   SpO2 98% Comment: ON RA  BMI 19.51 kg/m  He looks well, good spirits Cardiac  exam shows a regular rate and rhythm with normal valve sounds Lungs are clear The wound is granulating well and clean. Skin edges look good. No debridement needed. The wound is narrow and may be 0.5 cm deep. Wires not exposed.  Diagnostic Tests:  Study Result   CLINICAL DATA:  Status post aortic valve replacement on April 22, 2017. Sternal wound debridement on May 19, 2017 with wound VAC placed on May 13, 2017. No current complaints.  EXAM: CHEST  2 VIEW  COMPARISON:  CT scan of the chest of May 10, 2017 and chest x-ray of May 05, 2017.  FINDINGS: The lungs are well-expanded and clear. The retrosternal soft tissues do not appear abnormal. The sternal wires are intact. The heart and pulmonary vascularity are normal. The prosthetic aortic valve ring is in reasonable position. There is no pleural effusion. There is calcification in the wall of the aortic arch. There is mild multilevel degenerative disc disease of the thoracic spine. The right-sided PICC line tip terminates in proximal SVC.  IMPRESSION: There is no acute cardiopulmonary abnormality.   Electronically Signed   By: David  Martinique M.D.   On: 05/18/2017 09:19     Impression:  Superficial sternal wound infection with Serratia. There does not appear to be any deeper infection at this time. The  VAC was changed by the nurse.  Plan:  Continue antibiotic and VAC changes at home. I will see next Monday for wound check.   Gaye Pollack, MD [facility] [phone]

## 2017-05-20 DIAGNOSIS — I2510 Atherosclerotic heart disease of native coronary artery without angina pectoris: Secondary | ICD-10-CM | POA: Diagnosis not present

## 2017-05-20 DIAGNOSIS — T814XXA Infection following a procedure, initial encounter: Secondary | ICD-10-CM | POA: Diagnosis not present

## 2017-05-20 DIAGNOSIS — I739 Peripheral vascular disease, unspecified: Secondary | ICD-10-CM | POA: Diagnosis not present

## 2017-05-20 DIAGNOSIS — E785 Hyperlipidemia, unspecified: Secondary | ICD-10-CM | POA: Diagnosis not present

## 2017-05-20 DIAGNOSIS — Z452 Encounter for adjustment and management of vascular access device: Secondary | ICD-10-CM | POA: Diagnosis not present

## 2017-05-20 DIAGNOSIS — M1990 Unspecified osteoarthritis, unspecified site: Secondary | ICD-10-CM | POA: Diagnosis not present

## 2017-05-23 ENCOUNTER — Encounter: Payer: Self-pay | Admitting: Surgery

## 2017-05-23 ENCOUNTER — Ambulatory Visit (INDEPENDENT_AMBULATORY_CARE_PROVIDER_SITE_OTHER): Payer: Self-pay | Admitting: Surgery

## 2017-05-23 ENCOUNTER — Ambulatory Visit: Payer: Medicare Other

## 2017-05-23 VITALS — BP 118/63 | HR 65 | Resp 20 | Ht 70.0 in | Wt 140.0 lb

## 2017-05-23 DIAGNOSIS — I25119 Atherosclerotic heart disease of native coronary artery with unspecified angina pectoris: Secondary | ICD-10-CM

## 2017-05-23 DIAGNOSIS — S21109A Unspecified open wound of unspecified front wall of thorax without penetration into thoracic cavity, initial encounter: Secondary | ICD-10-CM

## 2017-05-23 DIAGNOSIS — Z952 Presence of prosthetic heart valve: Secondary | ICD-10-CM

## 2017-05-23 DIAGNOSIS — I350 Nonrheumatic aortic (valve) stenosis: Secondary | ICD-10-CM

## 2017-05-24 DIAGNOSIS — E785 Hyperlipidemia, unspecified: Secondary | ICD-10-CM | POA: Diagnosis not present

## 2017-05-24 DIAGNOSIS — T814XXA Infection following a procedure, initial encounter: Secondary | ICD-10-CM | POA: Diagnosis not present

## 2017-05-24 DIAGNOSIS — Z452 Encounter for adjustment and management of vascular access device: Secondary | ICD-10-CM | POA: Diagnosis not present

## 2017-05-24 DIAGNOSIS — I739 Peripheral vascular disease, unspecified: Secondary | ICD-10-CM | POA: Diagnosis not present

## 2017-05-24 DIAGNOSIS — I2510 Atherosclerotic heart disease of native coronary artery without angina pectoris: Secondary | ICD-10-CM | POA: Diagnosis not present

## 2017-05-24 DIAGNOSIS — M1990 Unspecified osteoarthritis, unspecified site: Secondary | ICD-10-CM | POA: Diagnosis not present

## 2017-05-25 ENCOUNTER — Encounter: Payer: Self-pay | Admitting: Surgery

## 2017-05-25 DIAGNOSIS — Z452 Encounter for adjustment and management of vascular access device: Secondary | ICD-10-CM | POA: Diagnosis not present

## 2017-05-25 DIAGNOSIS — I739 Peripheral vascular disease, unspecified: Secondary | ICD-10-CM | POA: Diagnosis not present

## 2017-05-25 DIAGNOSIS — I2510 Atherosclerotic heart disease of native coronary artery without angina pectoris: Secondary | ICD-10-CM | POA: Diagnosis not present

## 2017-05-25 DIAGNOSIS — E785 Hyperlipidemia, unspecified: Secondary | ICD-10-CM | POA: Diagnosis not present

## 2017-05-25 DIAGNOSIS — M1990 Unspecified osteoarthritis, unspecified site: Secondary | ICD-10-CM | POA: Diagnosis not present

## 2017-05-25 DIAGNOSIS — T814XXA Infection following a procedure, initial encounter: Secondary | ICD-10-CM | POA: Diagnosis not present

## 2017-05-25 NOTE — Progress Notes (Signed)
      HPI:  Mr. Unk Lightning returns for examination of his chest wound. He has been feeling well without fever or chills, no chest pain. Home health nursing is changing his VAC at home three times per week and giving him daily antibiotics.  Current Outpatient Prescriptions  Medication Sig Dispense Refill  . aspirin EC 325 MG tablet Take 1 tablet (325 mg total) by mouth daily.    . cefTRIAXone 2 g in dextrose 5 % 50 mL Inject 2 g into the vein daily. 35 Units 0  . traMADol (ULTRAM) 50 MG tablet Take 1 tablet (50 mg total) by mouth every 6 (six) hours as needed for severe pain. 30 tablet 0   No current facility-administered medications for this visit.      Physical Exam: BP 118/63   Pulse 65   Resp 20   Ht 5\' 10"  (1.778 m)   Wt 140 lb (63.5 kg)   SpO2 97% Comment: RA  BMI 20.09 kg/m  He looks well The chest wound is superficial and granulating well. There is no purulent drainage. The sternum feels stable. There are no exposed wires.   Impression:  The wound is healing well at this time. I would continue the [facility] for a little while longer but soon it will not be possible to insert the VAC sponge and we will have to switch to wet to dry dressings.  Plan:  Return in two weeks for wound check.   Gaye Pollack, MD [facility] [phone]

## 2017-05-27 DIAGNOSIS — I2510 Atherosclerotic heart disease of native coronary artery without angina pectoris: Secondary | ICD-10-CM | POA: Diagnosis not present

## 2017-05-27 DIAGNOSIS — M1990 Unspecified osteoarthritis, unspecified site: Secondary | ICD-10-CM | POA: Diagnosis not present

## 2017-05-27 DIAGNOSIS — I739 Peripheral vascular disease, unspecified: Secondary | ICD-10-CM | POA: Diagnosis not present

## 2017-05-27 DIAGNOSIS — T814XXA Infection following a procedure, initial encounter: Secondary | ICD-10-CM | POA: Diagnosis not present

## 2017-05-27 DIAGNOSIS — E785 Hyperlipidemia, unspecified: Secondary | ICD-10-CM | POA: Diagnosis not present

## 2017-05-27 DIAGNOSIS — Z452 Encounter for adjustment and management of vascular access device: Secondary | ICD-10-CM | POA: Diagnosis not present

## 2017-05-28 DIAGNOSIS — T814XXA Infection following a procedure, initial encounter: Secondary | ICD-10-CM | POA: Diagnosis not present

## 2017-05-28 DIAGNOSIS — M1990 Unspecified osteoarthritis, unspecified site: Secondary | ICD-10-CM | POA: Diagnosis not present

## 2017-05-28 DIAGNOSIS — I739 Peripheral vascular disease, unspecified: Secondary | ICD-10-CM | POA: Diagnosis not present

## 2017-05-28 DIAGNOSIS — E785 Hyperlipidemia, unspecified: Secondary | ICD-10-CM | POA: Diagnosis not present

## 2017-05-28 DIAGNOSIS — I2510 Atherosclerotic heart disease of native coronary artery without angina pectoris: Secondary | ICD-10-CM | POA: Diagnosis not present

## 2017-05-28 DIAGNOSIS — Z452 Encounter for adjustment and management of vascular access device: Secondary | ICD-10-CM | POA: Diagnosis not present

## 2017-05-30 DIAGNOSIS — M1990 Unspecified osteoarthritis, unspecified site: Secondary | ICD-10-CM | POA: Diagnosis not present

## 2017-05-30 DIAGNOSIS — E785 Hyperlipidemia, unspecified: Secondary | ICD-10-CM | POA: Diagnosis not present

## 2017-05-30 DIAGNOSIS — I2510 Atherosclerotic heart disease of native coronary artery without angina pectoris: Secondary | ICD-10-CM | POA: Diagnosis not present

## 2017-05-30 DIAGNOSIS — Z452 Encounter for adjustment and management of vascular access device: Secondary | ICD-10-CM | POA: Diagnosis not present

## 2017-05-30 DIAGNOSIS — I739 Peripheral vascular disease, unspecified: Secondary | ICD-10-CM | POA: Diagnosis not present

## 2017-05-30 DIAGNOSIS — T814XXA Infection following a procedure, initial encounter: Secondary | ICD-10-CM | POA: Diagnosis not present

## 2017-06-01 DIAGNOSIS — M1990 Unspecified osteoarthritis, unspecified site: Secondary | ICD-10-CM | POA: Diagnosis not present

## 2017-06-01 DIAGNOSIS — I739 Peripheral vascular disease, unspecified: Secondary | ICD-10-CM | POA: Diagnosis not present

## 2017-06-01 DIAGNOSIS — T814XXA Infection following a procedure, initial encounter: Secondary | ICD-10-CM | POA: Diagnosis not present

## 2017-06-01 DIAGNOSIS — I2510 Atherosclerotic heart disease of native coronary artery without angina pectoris: Secondary | ICD-10-CM | POA: Diagnosis not present

## 2017-06-01 DIAGNOSIS — Z452 Encounter for adjustment and management of vascular access device: Secondary | ICD-10-CM | POA: Diagnosis not present

## 2017-06-01 DIAGNOSIS — E785 Hyperlipidemia, unspecified: Secondary | ICD-10-CM | POA: Diagnosis not present

## 2017-06-03 ENCOUNTER — Telehealth: Payer: Self-pay

## 2017-06-03 NOTE — Telephone Encounter (Signed)
Wound vac was removed. Home health nurse will apply wet to dry dressing to area @ bottom of sternal incision. The area is measuring 0.6 cm deep and 0.4 cm width and length. Patient is schedule to see Dr Cyndia Bent on 06/08/2017.

## 2017-06-06 DIAGNOSIS — E785 Hyperlipidemia, unspecified: Secondary | ICD-10-CM | POA: Diagnosis not present

## 2017-06-06 DIAGNOSIS — M1990 Unspecified osteoarthritis, unspecified site: Secondary | ICD-10-CM | POA: Diagnosis not present

## 2017-06-06 DIAGNOSIS — Z452 Encounter for adjustment and management of vascular access device: Secondary | ICD-10-CM | POA: Diagnosis not present

## 2017-06-06 DIAGNOSIS — T814XXA Infection following a procedure, initial encounter: Secondary | ICD-10-CM | POA: Diagnosis not present

## 2017-06-06 DIAGNOSIS — I739 Peripheral vascular disease, unspecified: Secondary | ICD-10-CM | POA: Diagnosis not present

## 2017-06-06 DIAGNOSIS — I2510 Atherosclerotic heart disease of native coronary artery without angina pectoris: Secondary | ICD-10-CM | POA: Diagnosis not present

## 2017-06-08 ENCOUNTER — Ambulatory Visit (INDEPENDENT_AMBULATORY_CARE_PROVIDER_SITE_OTHER): Payer: Self-pay | Admitting: Surgery

## 2017-06-08 ENCOUNTER — Encounter: Payer: Self-pay | Admitting: Surgery

## 2017-06-08 VITALS — BP 144/85 | HR 65 | Resp 16 | Ht 70.0 in | Wt 135.0 lb

## 2017-06-08 DIAGNOSIS — S21109A Unspecified open wound of unspecified front wall of thorax without penetration into thoracic cavity, initial encounter: Secondary | ICD-10-CM

## 2017-06-08 DIAGNOSIS — Z952 Presence of prosthetic heart valve: Secondary | ICD-10-CM

## 2017-06-10 DIAGNOSIS — I739 Peripheral vascular disease, unspecified: Secondary | ICD-10-CM | POA: Diagnosis not present

## 2017-06-10 DIAGNOSIS — M1990 Unspecified osteoarthritis, unspecified site: Secondary | ICD-10-CM | POA: Diagnosis not present

## 2017-06-10 DIAGNOSIS — I2510 Atherosclerotic heart disease of native coronary artery without angina pectoris: Secondary | ICD-10-CM | POA: Diagnosis not present

## 2017-06-10 DIAGNOSIS — T814XXA Infection following a procedure, initial encounter: Secondary | ICD-10-CM | POA: Diagnosis not present

## 2017-06-10 DIAGNOSIS — Z452 Encounter for adjustment and management of vascular access device: Secondary | ICD-10-CM | POA: Diagnosis not present

## 2017-06-10 DIAGNOSIS — E785 Hyperlipidemia, unspecified: Secondary | ICD-10-CM | POA: Diagnosis not present

## 2017-06-12 ENCOUNTER — Encounter: Payer: Self-pay | Admitting: Surgery

## 2017-06-12 NOTE — Progress Notes (Signed)
     HPI:  Mr. Unk Lightning returns for examination of his chest wound. He has been feeling well without fever or chills, no chest pain. The wound has essentially healed although he has still noted a small amount of yellowish drainage from one spot in the upper part of the incision. He is still on daily IV antibiotics at home.  Current Outpatient Prescriptions  Medication Sig Dispense Refill  . aspirin EC 325 MG tablet Take 1 tablet (325 mg total) by mouth daily.    . cefTRIAXone 2 g in dextrose 5 % 50 mL Inject 2 g into the vein daily. 35 Units 0  . traMADol (ULTRAM) 50 MG tablet Take 1 tablet (50 mg total) by mouth every 6 (six) hours as needed for severe pain. 30 tablet 0   No current facility-administered medications for this visit.      Physical Exam: BP (!) 144/85 (BP Location: Left Arm, Patient Position: Sitting, Cuff Size: Normal)   Pulse 65   Resp 16   Ht 5\' 10"  (1.778 m)   Wt 135 lb (61.2 kg)   SpO2 97% Comment: ON RA  BMI 19.37 kg/m  He looks well The chest incision is healed but there is a small bubble in the upper part of the incision that is where the fluid was draining according to him. There is do drainage at this time and no significant erythema The sternum is stable.  Diagnostic Tests:  None today  Impression:  Superficial sternal wound infection with Serratia. This has healed fairly well but I am concerned that the small bubble in the incision may be a sinus tract that will open and drain. It is possible that there is some deeper contamination of a sternal wire although the wires were never exposed when the wound was initially debrided. I asked him to continue observation and call me if there was any change or increased drainage.   Plan:  He will continue his IV antibiotic course and I will see back next week for wound check.    Gaye Pollack, MD [facility] [phone]

## 2017-06-14 DIAGNOSIS — E785 Hyperlipidemia, unspecified: Secondary | ICD-10-CM | POA: Diagnosis not present

## 2017-06-14 DIAGNOSIS — I739 Peripheral vascular disease, unspecified: Secondary | ICD-10-CM | POA: Diagnosis not present

## 2017-06-14 DIAGNOSIS — Z452 Encounter for adjustment and management of vascular access device: Secondary | ICD-10-CM | POA: Diagnosis not present

## 2017-06-14 DIAGNOSIS — T814XXA Infection following a procedure, initial encounter: Secondary | ICD-10-CM | POA: Diagnosis not present

## 2017-06-14 DIAGNOSIS — I2510 Atherosclerotic heart disease of native coronary artery without angina pectoris: Secondary | ICD-10-CM | POA: Diagnosis not present

## 2017-06-14 DIAGNOSIS — M1990 Unspecified osteoarthritis, unspecified site: Secondary | ICD-10-CM | POA: Diagnosis not present

## 2017-06-15 ENCOUNTER — Encounter: Payer: Self-pay | Admitting: Surgery

## 2017-06-15 ENCOUNTER — Ambulatory Visit (INDEPENDENT_AMBULATORY_CARE_PROVIDER_SITE_OTHER): Payer: Self-pay | Admitting: Surgery

## 2017-06-15 VITALS — BP 111/69 | HR 60 | Resp 16 | Ht 70.0 in | Wt 135.0 lb

## 2017-06-15 DIAGNOSIS — S21109A Unspecified open wound of unspecified front wall of thorax without penetration into thoracic cavity, initial encounter: Secondary | ICD-10-CM

## 2017-06-15 DIAGNOSIS — Z952 Presence of prosthetic heart valve: Secondary | ICD-10-CM

## 2017-06-15 NOTE — Progress Notes (Signed)
     HPI:  The patient returns today for follow up of his chest incision. The wound has completely healed and the area in the upper portion where there was a fluctuant bubble has resolved. There is no erythema and he has noted no drainage. The sternum is stable. He is still on IV antibiotics at home.  Current Outpatient Prescriptions  Medication Sig Dispense Refill  . aspirin EC 325 MG tablet Take 1 tablet (325 mg total) by mouth daily.    . cefTRIAXone 2 g in dextrose 5 % 50 mL Inject 2 g into the vein daily. 35 Units 0  . traMADol (ULTRAM) 50 MG tablet Take 1 tablet (50 mg total) by mouth every 6 (six) hours as needed for severe pain. 30 tablet 0   No current facility-administered medications for this visit.      Physical Exam: BP 111/69 (BP Location: Right Arm, Patient Position: Sitting, Cuff Size: Normal)   Pulse 60   Resp 16   Ht 5\' 10"  (1.778 m)   Wt 135 lb (61.2 kg)   SpO2 98% Comment: ON RA  BMI 19.37 kg/m  He looks well  The chest incision is healed with no signs of infection. Sternum is stable  Diagnostic Tests:  None today  Impression:  The chest wound appears completely healed at this time.   Plan:  He has another week or so of antibiotics and has an appointment with Dr. Baxter Flattery on 7/31. I will plan to see back in 2 weeks for reexamination of the chest incision. If there is any change he will contact our office immediately for evaluation.   Gaye Pollack, MD [facility] [phone]

## 2017-06-17 DIAGNOSIS — T814XXA Infection following a procedure, initial encounter: Secondary | ICD-10-CM | POA: Diagnosis not present

## 2017-06-17 DIAGNOSIS — G479 Sleep disorder, unspecified: Secondary | ICD-10-CM | POA: Diagnosis not present

## 2017-06-17 DIAGNOSIS — I739 Peripheral vascular disease, unspecified: Secondary | ICD-10-CM | POA: Diagnosis not present

## 2017-06-17 DIAGNOSIS — E785 Hyperlipidemia, unspecified: Secondary | ICD-10-CM | POA: Diagnosis not present

## 2017-06-17 DIAGNOSIS — I2510 Atherosclerotic heart disease of native coronary artery without angina pectoris: Secondary | ICD-10-CM | POA: Diagnosis not present

## 2017-06-17 DIAGNOSIS — Z952 Presence of prosthetic heart valve: Secondary | ICD-10-CM | POA: Diagnosis not present

## 2017-06-17 DIAGNOSIS — L750 Bromhidrosis: Secondary | ICD-10-CM | POA: Diagnosis not present

## 2017-06-17 DIAGNOSIS — M1990 Unspecified osteoarthritis, unspecified site: Secondary | ICD-10-CM | POA: Diagnosis not present

## 2017-06-17 DIAGNOSIS — Z452 Encounter for adjustment and management of vascular access device: Secondary | ICD-10-CM | POA: Diagnosis not present

## 2017-06-21 ENCOUNTER — Ambulatory Visit (INDEPENDENT_AMBULATORY_CARE_PROVIDER_SITE_OTHER): Payer: Medicare Other | Admitting: Internal Medicine

## 2017-06-21 ENCOUNTER — Encounter: Payer: Self-pay | Admitting: Internal Medicine

## 2017-06-21 VITALS — BP 132/76 | HR 62 | Temp 97.9°F | Wt 140.0 lb

## 2017-06-21 DIAGNOSIS — I25119 Atherosclerotic heart disease of native coronary artery with unspecified angina pectoris: Secondary | ICD-10-CM

## 2017-06-21 DIAGNOSIS — M869 Osteomyelitis, unspecified: Secondary | ICD-10-CM

## 2017-06-21 NOTE — Progress Notes (Signed)
RN received verbal order to discontinue the patient's PICC line.  Patient identified with name and date of birth. PICC dressing removed, site unremarkable. PICC line removed using sterile procedure @ 1130. PICC length equal to that noted in patient's hospital chart of 42 cm. Sterile petroleum gauze + sterile 4X4 applied to PICC site, pressure applied for 10 minutes and covered with Medipore tape as a pressure dressing. Patient tolerated procedure without complaints.  Patient instructed to limit use of arm for 1 hour. Patient instructed that the pressure dressing should remain in place for 24 hours. Patient verbalized understanding of these instructions.

## 2017-06-21 NOTE — Progress Notes (Signed)
RFV: follow up on sternal osteo Patient ID: Darin Simmons, male   DOB: 11/28/1940, 76 y.o.   MRN: [id number]  HPI Darin Simmons is a 76yo M who underwent a bioprosthetic AVR done 04/22/17 and some sternal wound drainage that started 2 days ago post operatively.  He was started on Keflex and had a wound swab sent from the office and was admitted for management of wound dehiscence. He was started on vancomycin and zosyn after blood cultures which are now 1/2 positive for CoNS, not aureus, methicillin resistant but would cultures grew serratia. He had still ongoing drainage which prompted imaging that suggested early sternal osteo. He did undergo debridement plus wound vac and 6 wk of iv ceftriaxone. His latest inflammatory markers are: Sed rate 10, and crp 3.1 last week. He finished his abtx 3 days prior to this visit. His wound is completely healed.  He is working on improving his overall indurance and deconditioning. He is aiming to fly back to [place] since his 76+ yr old mother's health is failing.  Outpatient Encounter Prescriptions as of 06/21/2017  Medication Sig  . aspirin EC 325 MG tablet Take 1 tablet (325 mg total) by mouth daily.  . nitroGLYCERIN (NITROSTAT) 0.4 MG SL tablet   . zolpidem (AMBIEN) 10 MG tablet   . traMADol (ULTRAM) 50 MG tablet Take 1 tablet (50 mg total) by mouth every 6 (six) hours as needed for severe pain. (Patient not taking: Reported on 06/21/2017)   No facility-administered encounter medications on file as of 06/21/2017.      Patient Active Problem List   Diagnosis Date Noted  . Serratia wound infection   . 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 Wound infection after surgery   . Wound cellulitis   . Gram-negative bacteremia   . Wound infection 05/05/2017  . 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 Aortic atherosclerosis ([place]) 02/13/2015  . Coronary atherosclerosis of native coronary artery 01/01/2014  . Mixed hyperlipidemia 01/01/2014  .  Bilateral inguinal hernia 03/28/2012  . Umbilical hernia 18/84/1660  . Aortic stenosis 01/21/2011     Health Maintenance Due  Topic Date Due  . TETANUS/TDAP  06/08/1962  . COLONOSCOPY  06/08/1993  . PNA vac Low Risk Adult (1 of 2 - PCV13) 06/08/2008    Social History  Substance Use Topics  . Smoking status: Former Smoker    Packs/day: 1.00    Years: 50.00    Types: Cigarettes    Quit date: 11/22/2008  . Smokeless tobacco: Never Used  . Alcohol use No  family history includes Heart disease in his father. Review of Systems Review of Systems  Constitutional: + fatigue Negative for fever, chills, diaphoresis,, appetite change, and unexpected weight change.  HENT: Negative for congestion, sore throat, rhinorrhea, sneezing, trouble swallowing and sinus pressure.  Eyes: Negative for photophobia and visual disturbance.  Respiratory: Negative for cough, chest tightness, shortness of breath, wheezing and stridor.  Cardiovascular: Negative for chest pain, palpitations and leg swelling.  Gastrointestinal: Negative for nausea, vomiting, abdominal pain, diarrhea, constipation, blood in stool, abdominal distention and anal bleeding.  Genitourinary: Negative for dysuria, hematuria, flank pain and difficulty urinating.  Musculoskeletal: Negative for myalgias, back pain, joint swelling, arthralgias and gait problem.  Skin: Negative for color change, pallor, rash and wound.  Neurological: Negative for dizziness, tremors, weakness and light-headedness.  Hematological: Negative for adenopathy. Does not bruise/bleed easily.  Psychiatric/Behavioral: Negative for behavioral problems, confusion, sleep disturbance, dysphoric mood, decreased concentration and agitation.  Physical Exam   BP 132/76   Pulse 62   Temp 97.9 F (36.6 C) (Oral)   Wt 140 lb (63.5 kg)   BMI 20.09 kg/m    Physical Exam  Constitutional: He is oriented to person, place, and time. He appears well-developed and  well-nourished. No distress.  HENT:  Mouth/Throat: Oropharynx is clear and moist. No oropharyngeal exudate.  Pulmonary/Chest: Effort normal and breath sounds normal. No respiratory distress. He has no wheezes.  Chest wall = well healed midline incision  Skin: Skin is warm and dry. No rash noted. No erythema.  Psychiatric: He has a normal mood and affect. His behavior is normal.    CBC Lab Results  Component Value Date   WBC 6.7 05/12/2017   RBC 3.34 (L) 05/12/2017   HGB 9.8 (L) 05/12/2017   HCT 30.6 (L) 05/12/2017   PLT 336 05/12/2017   MCV 91.6 05/12/2017   MCH 29.3 05/12/2017   MCHC 32.0 05/12/2017   RDW 13.0 05/12/2017   LYMPHSABS 1.2 05/12/2017   MONOABS 0.4 05/12/2017   EOSABS 0.1 05/12/2017    BMET Lab Results  Component Value Date   NA 139 05/10/2017   K 3.7 05/10/2017   CL 105 05/10/2017   CO2 25 05/10/2017   GLUCOSE 113 (H) 05/10/2017   BUN 11 05/10/2017   CREATININE 0.68 05/12/2017   CALCIUM 8.8 (L) 05/10/2017   GFRNONAA >60 05/12/2017   GFRAA >60 05/12/2017    Lab Results  Component Value Date   ESRSEDRATE 4 06/21/2017   Lab Results  Component Value Date   CRP 1.1 06/21/2017     Assessment and Plan Sternal osteo = inflammatory markers last week are improved and WNL. We will recheck labs to see still WNL since stopping iv abtx. If elevated, then will do 2 wk of oral abtx. For now, I suspect he has completed his iv abtx  - pull picc line  Wound care = appears no longer needing any dressing changes. Continue to monitor for any changes but for now it appears well healed chest incision

## 2017-06-22 LAB — C-REACTIVE PROTEIN: CRP: 1.1 mg/L (ref <8.0)

## 2017-06-22 LAB — SEDIMENTATION RATE: Sed Rate: 4 mm/hr (ref 0–20)

## 2017-06-23 ENCOUNTER — Telehealth: Payer: Self-pay

## 2017-06-23 ENCOUNTER — Telehealth (HOSPITAL_COMMUNITY): Payer: Self-pay | Admitting: *Deleted

## 2017-06-23 NOTE — Telephone Encounter (Signed)
Returned call from messages left by patient regarding scheduling for cardiac rehab.  Message left that rehab staff awaiting clearance from Dr. Cyndia Bent and Dr. Baxter Flattery in order to proceed with group exercise. In basket message sent to Dr. Baxter Flattery and Dr. Cyndia Bent previous in basket message sent to his nurse, jo lene for follow up.  Contact information provided. Cherre Huger, BSN Cardiac and Training and development officer

## 2017-06-23 NOTE — Telephone Encounter (Signed)
Patient walked into office today requesting clearance for cardiac rehab.   His 77+ years old Mother's health is failing  and he would like to visit and help her with daily needs.  Per Dr Charolett Bumpers is clear for cardia rehab.  as needed.     I will forward note to Cardia rehab.   Laverle Patter, RN

## 2017-06-23 NOTE — Telephone Encounter (Signed)
-----   Message from Margit Hanks, RN sent at 06/23/2017 12:01 PM EDT ----- Regarding: RE: Ok to proceed with participation in Cardiac Rehab At his last visit, Dr. Cyndia Bent told Mr. Bakos that he could start rehab when cleared by ID. He knew he was eager to begin because he was planning a trip to [place] to visit his mother. Fanny Bien, R.N. ----- Message ----- From: Rowe Pavy, RN Sent: 06/23/2017   9:19 AM To: Margit Hanks, RN Subject: FW: Ok to proceed with participation in Card#  Abby Potash,  Pt is anxious to get started in cardiac rehab.  I sent this to [facility] but hadn't heard back - He may be out.  Is there a possibility one of the PA in the office today could review the last surgeon visit and Dr. Baxter Flattery office notes to assess readiness to participate in CR? Thanks so much Carlette Armed forces operational officer, BSN Cardiac and Pulmonary Rehab Nurse Navigator   ----- Message ----- From: Rowe Pavy, RN Sent: 06/20/2017   2:07 PM To: Gaye Pollack, MD Subject: Ok to proceed with participation in Cardiac #  Dr. Cyndia Bent,  The above pt contacted CR today wanting to sign up.  I had placed pt on hold after reviewing your office visit note on 7/25 for follow up sternal wound infection.  Pt will see you on 8.8.  Pt reports that he finished his atbx therapy on Saturday and will see the ID Md on tomorrow and pt plans to ask for clearance from their standpoint.  Based upon your assessment, do you feel pt may proceed with group exercise in CR prior to your follow up appt on 8/8? (pt has PICC line in place and is unsure who will discontinue)  FYI pt is eager to get going due to needing to travel to [place] to see his 31 year old mother who's health is declining  Thanks for your input Psychologist, clinical, BSN Cardiac and Training and development officer

## 2017-06-24 DIAGNOSIS — Z452 Encounter for adjustment and management of vascular access device: Secondary | ICD-10-CM | POA: Diagnosis not present

## 2017-06-24 DIAGNOSIS — E785 Hyperlipidemia, unspecified: Secondary | ICD-10-CM | POA: Diagnosis not present

## 2017-06-24 DIAGNOSIS — T814XXA Infection following a procedure, initial encounter: Secondary | ICD-10-CM | POA: Diagnosis not present

## 2017-06-24 DIAGNOSIS — I2510 Atherosclerotic heart disease of native coronary artery without angina pectoris: Secondary | ICD-10-CM | POA: Diagnosis not present

## 2017-06-24 DIAGNOSIS — M1990 Unspecified osteoarthritis, unspecified site: Secondary | ICD-10-CM | POA: Diagnosis not present

## 2017-06-24 DIAGNOSIS — I739 Peripheral vascular disease, unspecified: Secondary | ICD-10-CM | POA: Diagnosis not present

## 2017-06-27 ENCOUNTER — Telehealth (HOSPITAL_COMMUNITY): Payer: Self-pay | Admitting: *Deleted

## 2017-06-27 NOTE — Telephone Encounter (Signed)
-----   Message from Gaye Pollack, MD sent at 06/27/2017 10:35 AM EDT ----- Regarding: RE: Ok to proceed with participation in Cardiac Rehab He is fine to start cardiac rehab.  ----- Message ----- From: Rowe Pavy, RN Sent: 06/20/2017   2:07 PM To: Gaye Pollack, MD Subject: Ok to proceed with participation in Cardiac #  Dr. Cyndia Bent,  The above pt contacted CR today wanting to sign up.  I had placed pt on hold after reviewing your office visit note on 7/25 for follow up sternal wound infection.  Pt will see you on 8.8.  Pt reports that he finished his atbx therapy on Saturday and will see the ID Md on tomorrow and pt plans to ask for clearance from their standpoint.  Based upon your assessment, do you feel pt may proceed with group exercise in CR prior to your follow up appt on 8/8? (pt has PICC line in place and is unsure who will discontinue)  FYI pt is eager to get going due to needing to travel to [place] to see his 29 year old mother who's health is declining  Thanks for your input Psychologist, clinical, BSN Cardiac and Training and development officer

## 2017-06-27 NOTE — Telephone Encounter (Signed)
-----   Message from Jettie Booze, MD sent at 06/26/2017  3:58 PM EDT ----- Regarding: RE: Ok to proceed with cardiac rehab?? Yes.  OK to start rehab.  Thanks.  JV ----- Message ----- From: Rowe Pavy, RN Sent: 06/25/2017   7:05 PM To: Jettie Booze, MD Subject: Ok to proceed with cardiac rehab??             Dr. Irish Lack,  The above patient s/p AVR on 4/0/98 complicated recovery due to sternal infection is extremely anxious to get started with cardiac rehab.  Pt would like to attend to make sure he is okay to travel to [place] to see his mother whose health is declining.  Pt missed his cardiologist follow up on 6/19 with Danya Dunn due to being in the hospital at this time with sternal osteomyelitis.  I do not see where his appt was rescheduled with an extender or yourself.  Pt has had extensive follow up appt with Cyndia Bent and Baxter Flattery.  May pt proceed with starting cardiac rehab prior to being seen in the cardiologist office for follow up appointment? Pt adamant l to come this week on Tuesday for orientation.  Thank you for your Orion Crook RN, BSN Cardiac and Pulmonary Rehab Nurse Navigator

## 2017-06-28 ENCOUNTER — Encounter (HOSPITAL_COMMUNITY)
Admission: RE | Admit: 2017-06-28 | Discharge: 2017-06-28 | Disposition: A | Discharge status: Home / Self Care | Payer: Medicare Other | Source: Ambulatory Visit | Attending: Interventional Cardiology | Admitting: Interventional Cardiology

## 2017-06-28 ENCOUNTER — Encounter (HOSPITAL_COMMUNITY): Payer: Self-pay

## 2017-06-28 VITALS — BP 112/64 | HR 76 | Ht 70.0 in | Wt 142.6 lb

## 2017-06-28 DIAGNOSIS — Z953 Presence of xenogenic heart valve: Secondary | ICD-10-CM | POA: Diagnosis not present

## 2017-06-28 DIAGNOSIS — Z48812 Encounter for surgical aftercare following surgery on the circulatory system: Secondary | ICD-10-CM | POA: Diagnosis not present

## 2017-06-28 DIAGNOSIS — Z952 Presence of prosthetic heart valve: Secondary | ICD-10-CM

## 2017-06-28 NOTE — Progress Notes (Signed)
Darin Simmons 76 y.o. male DOB: 01-30-1941 MRN: [id number]      Nutrition Note  1. S/P AVR    Past Medical History:  Diagnosis Date  . 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 Aortic stenosis   . Arthritis   . BPH (benign prostatic hyperplasia)   . CAD (coronary artery disease) 2004  . Dyspnea   . Heart attack ([place]) 2004  . Heart murmur   . Hypercholesterolemia   . 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 PVD (peripheral vascular disease) ([place])   . Wound infection 04/2017   Meds reviewed.   HT: Ht Readings from Last 1 Encounters:  06/28/17 5\' 10"  (1.778 m)    WT: Wt Readings from Last 3 Encounters:  06/28/17 142 lb 10.2 oz (64.7 kg)  06/21/17 140 lb (63.5 kg)  06/15/17 135 lb (61.2 kg)     BMI 20.5   Current tobacco use? No  Labs:  Lipid Panel     Component Value Date/Time   CHOL 178 04/06/2017 0943   TRIG 115 04/06/2017 0943   HDL 47 04/06/2017 0943   CHOLHDL 3.8 04/06/2017 0943   CHOLHDL 2.4 08/27/2015 0901   VLDL 13 08/27/2015 0901   LDLCALC 108 (H) 04/06/2017 0943    Lab Results  Component Value Date   HGBA1C 5.1 05/05/2017   CBG (last 3)  No results for input(s): GLUCAP in the last 72 hours.  Nutrition Note Spoke with pt. Nutrition plan and goals reviewed with pt. Pt is following Step 1 of the Therapeutic Lifestyle Changes diet. Pt was on a vegetarian diet but was told he needed protein for wound healing. Pt has introduced salmon, mackerel, and chicken into his diet. Pt reports he follows an intermittent fasting diet. Pt educated by this Probation officer re: the need to eat before exercise classes. Per discussion with Maurice Small, RN, pt is dehydrated (skin tented when pinched). Pt expressed understanding of the information reviewed. Pt aware of nutrition education classes offered and states he has previously attended nutrition classes.  Nutrition Diagnosis ? Food-and nutrition-related knowledge deficit related to lack of exposure to information as related  to diagnosis of: ? CVD  Nutrition Intervention ? Pt's individual nutrition plan and goals reviewed with pt.  Nutrition Goal(s):  ? Pt to describe the benefit of including fruits, vegetables, whole grains, and low-fat dairy products in a heart healthy meal plan.  Plan:  Pt to attend nutrition classes ? Nutrition I ? Nutrition II ? Portion Distortion  Will provide client-centered nutrition education as part of interdisciplinary care.   Monitor and evaluate progress toward nutrition goal with team.  Derek Mound, M.Ed, RD, LDN, CDE 06/28/2017 1:44 PM

## 2017-06-28 NOTE — Progress Notes (Signed)
Cardiac Individual Treatment Plan  Patient Details  Name: Darin Simmons MRN: [id number] Date of Birth: 1941/05/10 Referring Provider:     CARDIAC REHAB PHASE II ORIENTATION from 06/28/2017 in [place]  Referring Provider  Larae Grooms, MD.      Initial Encounter Date:    CARDIAC REHAB PHASE II ORIENTATION from 06/28/2017 in [place]  Date  06/28/17  Referring Provider  Larae Grooms, MD.      Visit Diagnosis: S/P AVR  Patient's Home Medications on Admission:  Current Outpatient Prescriptions:  .  aspirin EC 325 MG tablet, Take 1 tablet (325 mg total) by mouth daily., Disp: , Rfl:  .  eszopiclone (LUNESTA) 1 MG TABS tablet, Take 1 mg by mouth at bedtime as needed for sleep (take 1-3m qHS PRN). Take immediately before bedtime, Disp: , Rfl:  .  nitroGLYCERIN (NITROSTAT) 0.4 MG SL tablet, , Disp: , Rfl:  .  traMADol (ULTRAM) 50 MG tablet, Take 1 tablet (50 mg total) by mouth every 6 (six) hours as needed for severe pain., Disp: 30 tablet, Rfl: 0  Past Medical History: Past Medical History:  Diagnosis Date  . 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 Aortic stenosis   . Arthritis   . BPH (benign prostatic hyperplasia)   . CAD (coronary artery disease) 2004  . Dyspnea   . [place]  . Heart murmur   . Hypercholesterolemia   . 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 [place]   . Wound infection 04/2017    Tobacco Use: History  Smoking Status  . Former Smoker  . Packs/day: 1.00  . Years: 50.00  . Types: Cigarettes  . Quit date: 11/22/2008  Smokeless Tobacco  . Never Used    Labs: Recent Review Flowsheet Data    Labs for ITP Cardiac and Pulmonary Rehab Latest Ref Rng & Units 04/22/2017 04/22/2017 04/22/2017 04/23/2017 05/05/2017   Cholestrol 100 - 199 mg/dL - - - - -   LDLCALC 0 - 99 mg/dL - - - - -   HDL >39 mg/dL - - - - -   Trlycerides 0 - 149 mg/dL -  - - - -   Hemoglobin A1c 4.8 - 5.6 % - - - - 5.1   PHART 7.350 - 7.450 7.480(H) 7.352 - - -   PCO2ART 32.0 - 48.0 mmHg 29.5(L) 38.7 - - -   HCO3 20.0 - 28.0 mmol/L 21.9 21.3 - - -   TCO2 0 - 100 mmol/L _0 -   ACIDBASEDEF 0.0 - 2.0 mmol/L 1.0 4.0(H) - - -   O2SAT % 99.0 98.0 - - -      Capillary Blood Glucose: Lab Results  Component Value Date   GLUCAP 84 04/24/2017   GLUCAP 116 (H) 04/24/2017   GLUCAP 82 04/23/2017   GLUCAP 147 (H) 04/23/2017   GLUCAP 120 (H) 04/23/2017     Exercise Target Goals: Date: 06/28/17  Exercise Program Goal: Individual exercise prescription set with THRR, safety & activity barriers. Participant demonstrates ability to understand and report RPE using BORG scale, to self-measure pulse accurately, and to acknowledge the importance of the exercise prescription.  Exercise Prescription Goal: Starting with aerobic activity 30 plus minutes a day, 3 days per week for initial exercise prescription. Provide home exercise prescription and guidelines that participant acknowledges understanding prior to discharge.  Activity Barriers & Risk Stratification:  Activity Barriers & Cardiac Risk Stratification - 06/28/17 1021      Activity Barriers & Cardiac Risk Stratification   Activity Barriers None   Cardiac Risk Stratification Moderate      6 Minute Walk:     6 Minute Walk    Row Name 06/28/17 1018         6 Minute Walk   Phase Initial     Distance 2016 feet     Walk Time 6 minutes     # of Rest Breaks 0     MPH 3.82     METS 4.61     RPE 12     VO2 Peak 16.13     Symptoms No     Resting HR 76 bpm     Resting BP 112/64     Max Ex. HR 104 bpm     Max Ex. BP 150/70     2 Minute Post BP 118/66        Oxygen Initial Assessment:   Oxygen Re-Evaluation:   Oxygen Discharge (Final Oxygen Re-Evaluation):   Initial Exercise Prescription:     Initial Exercise Prescription - 06/28/17 1000      Date of Initial Exercise RX and  Referring Provider   Date 06/28/17   Referring Provider Larae Grooms, MD.     Bike   Level 1.2   Minutes 10   METs 4.48     NuStep   Level 4   SPM 85   Minutes 10   METs 3     Track   Laps 13   Minutes 10   METs 3.26     Prescription Details   Frequency (times per week) 3   Duration Progress to 30 minutes of continuous aerobic without signs/symptoms of physical distress     Intensity   THRR 40-80% of Max Heartrate 58-117   Ratings of Perceived Exertion 11-13   Perceived Dyspnea 0-4     Progression   Progression Continue to progress workloads to maintain intensity without signs/symptoms of physical distress.     Resistance Training   Training Prescription Yes   Weight 3lbs.   Reps 10-15      Perform Capillary Blood Glucose checks as needed.  Exercise Prescription Changes:   Exercise Comments:   Exercise Goals and Review:     Exercise Goals    Row Name 06/28/17 1024             Exercise Goals   Increase Physical Activity Yes       Intervention Provide advice, education, support and counseling about physical activity/exercise needs.;Develop an individualized exercise prescription for aerobic and resistive training based on initial evaluation findings, risk stratification, comorbidities and participant's personal goals.       Expected Outcomes Achievement of increased cardiorespiratory fitness and enhanced flexibility, muscular endurance and strength shown through measurements of functional capacity and personal statement of participant.       Increase Strength and Stamina Yes       Intervention Provide advice, education, support and counseling about physical activity/exercise needs.;Develop an individualized exercise prescription for aerobic and resistive training based on initial evaluation findings, risk stratification, comorbidities and participant's personal goals.       Expected Outcomes Achievement of increased cardiorespiratory fitness and  enhanced flexibility, muscular endurance and strength shown through measurements of functional capacity and personal statement of participant.          Exercise Goals Re-Evaluation :    Discharge Exercise Prescription (  Final Exercise Prescription Changes):   Nutrition:  Target Goals: Understanding of nutrition guidelines, daily intake of sodium <1570m, cholesterol <2061m calories 30% from fat and 7% or less from saturated fats, daily to have 5 or more servings of fruits and vegetables.  Biometrics:     Pre Biometrics - 06/28/17 1022      Pre Biometrics   Height 5' 10" (1.778 m)   Weight 142 lb 10.2 oz (64.7 kg)   Waist Circumference 31.5 inches   Hip Circumference 36 inches   Waist to Hip Ratio 0.88 %   BMI (Calculated) 20.5   Triceps Skinfold 12 mm   % Body Fat 20.1 %   Grip Strength 33.5 kg   Flexibility 9 in   Single Leg Stand 21 seconds       Nutrition Therapy Plan and Nutrition Goals:   Nutrition Discharge: Nutrition Scores:   Nutrition Goals Re-Evaluation:   Nutrition Goals Re-Evaluation:   Nutrition Goals Discharge (Final Nutrition Goals Re-Evaluation):   Psychosocial: Target Goals: Acknowledge presence or absence of significant depression and/or stress, maximize coping skills, provide positive support system. Participant is able to verbalize types and ability to use techniques and skills needed for reducing stress and depression.  Initial Review & Psychosocial Screening:     Initial Psych Review & Screening - 06/28/17 1154      Initial Review   Current issues with Current Stress Concerns   Source of Stress Concerns Chronic Illness   Comments upon bried assessment, pt exhibits health related anxiety      Family Dynamics   Good Support System? Yes  pt identifies himself as support, spouse and friends as positive influence      Barriers   Psychosocial barriers to participate in program There are no identifiable barriers or psychosocial needs.      Screening Interventions   Interventions Encouraged to exercise;Provide feedback about the scores to participant      Quality of Life Scores:     Quality of Life - 06/28/17 1037      Quality of Life Scores   Health/Function Pre 22.57 %   Socioeconomic Pre 22.21 %   Psych/Spiritual Pre 21.75 %   Family Pre 22.5 %   GLOBAL Pre 22.32 %      PHQ-9: Recent Review Flowsheet Data    [name]2017   Decreased Interest 0   Down, Depressed, Hopeless 1   PHQ - 2 Score 1     Interpretation of Total Score  Total Score Depression Severity:  1-4 = Minimal depression, 5-9 = Mild depression, 10-14 = Moderate depression, 15-19 = Moderately severe depression, 20-27 = Severe depression   Psychosocial Evaluation and Intervention:   Psychosocial Re-Evaluation:   Psychosocial Discharge (Final Psychosocial Re-Evaluation):   Vocational Rehabilitation: Provide vocational rehab assistance to qualifying candidates.   Vocational Rehab Evaluation & Intervention:   Education: Education Goals: Education classes will be provided on a weekly basis, covering required topics. Participant will state understanding/return demonstration of topics presented.  Learning Barriers/Preferences:     Learning Barriers/Preferences - 06/28/17 1025      Learning Barriers/Preferences   Learning Barriers Sight;Hearing   Learning Preferences Written Material      Education Topics: Count Your Pulse:  -Group instruction provided by verbal instruction, demonstration, patient participation and written materials to support subject.  Instructors address importance of being able to find your pulse and how to count your pulse when at home without a heart monitor.  Patients get  hands on experience counting their pulse with staff help and individually.   Heart Attack, Angina, and Risk Factor Modification:  -Group instruction provided by verbal instruction, video, and written materials to  support subject.  Instructors address signs and symptoms of angina and heart attacks.    Also discuss risk factors for heart disease and how to make changes to improve heart health risk factors.   Functional Fitness:  -Group instruction provided by verbal instruction, demonstration, patient participation, and written materials to support subject.  Instructors address safety measures for doing things around the house.  Discuss how to get up and down off the floor, how to pick things up properly, how to safely get out of a chair without assistance, and balance training.   Meditation and Mindfulness:  -Group instruction provided by verbal instruction, patient participation, and written materials to support subject.  Instructor addresses importance of mindfulness and meditation practice to help reduce stress and improve awareness.  Instructor also leads participants through a meditation exercise.    Stretching for Flexibility and Mobility:  -Group instruction provided by verbal instruction, patient participation, and written materials to support subject.  Instructors lead participants through series of stretches that are designed to increase flexibility thus improving mobility.  These stretches are additional exercise for major muscle groups that are typically performed during regular warm up and cool down.   Hands Only CPR:  -Group verbal, video, and participation provides a basic overview of AHA guidelines for community CPR. Role-play of emergencies allow participants the opportunity to practice calling for help and chest compression technique with discussion of AED use.   Hypertension: -Group verbal and written instruction that provides a basic overview of hypertension including the most recent diagnostic guidelines, risk factor reduction with self-care instructions and medication management.    Nutrition I class: Heart Healthy Eating:  -Group instruction provided by PowerPoint slides, verbal  discussion, and written materials to support subject matter. The instructor gives an explanation and review of the Therapeutic Lifestyle Changes diet recommendations, which includes a discussion on lipid goals, dietary fat, sodium, fiber, plant stanol/sterol esters, sugar, and the components of a well-balanced, healthy diet.   Nutrition II class: Lifestyle Skills:  -Group instruction provided by PowerPoint slides, verbal discussion, and written materials to support subject matter. The instructor gives an explanation and review of label reading, grocery shopping for heart health, heart healthy recipe modifications, and ways to make healthier choices when eating out.   Diabetes Question & Answer:  -Group instruction provided by PowerPoint slides, verbal discussion, and written materials to support subject matter. The instructor gives an explanation and review of diabetes co-morbidities, pre- and post-prandial blood glucose goals, pre-exercise blood glucose goals, signs, symptoms, and treatment of hypoglycemia and hyperglycemia, and foot care basics.   Diabetes Blitz:  -Group instruction provided by PowerPoint slides, verbal discussion, and written materials to support subject matter. The instructor gives an explanation and review of the physiology behind type 1 and type 2 diabetes, diabetes medications and rational behind using different medications, pre- and post-prandial blood glucose recommendations and Hemoglobin A1c goals, diabetes diet, and exercise including blood glucose guidelines for exercising safely.    Portion Distortion:  -Group instruction provided by PowerPoint slides, verbal discussion, written materials, and food models to support subject matter. The instructor gives an explanation of serving size versus portion size, changes in portions sizes over the last 20 years, and what consists of a serving from each food group.   Stress Management:  -Group instruction  provided by verbal  instruction, video, and written materials to support subject matter.  Instructors review role of stress in heart disease and how to cope with stress positively.     Exercising on Your Own:  -Group instruction provided by verbal instruction, power point, and written materials to support subject.  Instructors discuss benefits of exercise, components of exercise, frequency and intensity of exercise, and end points for exercise.  Also discuss use of nitroglycerin and activating EMS.  Review options of places to exercise outside of rehab.  Review guidelines for sex with heart disease.   Cardiac Drugs I:  -Group instruction provided by verbal instruction and written materials to support subject.  Instructor reviews cardiac drug classes: antiplatelets, anticoagulants, beta blockers, and statins.  Instructor discusses reasons, side effects, and lifestyle considerations for each drug class.   Cardiac Drugs II:  -Group instruction provided by verbal instruction and written materials to support subject.  Instructor reviews cardiac drug classes: angiotensin converting enzyme inhibitors (ACE-I), angiotensin II receptor blockers (ARBs), nitrates, and calcium channel blockers.  Instructor discusses reasons, side effects, and lifestyle considerations for each drug class.   Anatomy and Physiology of the Circulatory System:  Group verbal and written instruction and models provide basic cardiac anatomy and physiology, with the coronary electrical and arterial systems. Review of: AMI, Angina, Valve disease, Heart Failure, Peripheral Artery Disease, Cardiac Arrhythmia, Pacemakers, and the ICD.   Other Education:  -Group or individual verbal, written, or video instructions that support the educational goals of the cardiac rehab program.   Knowledge Questionnaire Score:     Knowledge Questionnaire Score - 06/28/17 1032      Knowledge Questionnaire Score   Pre Score 22/24      Core Components/Risk  Factors/Patient Goals at Admission:     Personal Goals and Risk Factors at Admission - 06/28/17 1059      Core Components/Risk Factors/Patient Goals on Admission   Lipids Yes   Intervention Provide education and support for participant on nutrition & aerobic/resistive exercise along with prescribed medications to achieve LDL <34m, HDL >417m   Expected Outcomes Long Term: Cholesterol controlled with medications as prescribed, with individualized exercise RX and with personalized nutrition plan. Value goals: LDL < 7031mHDL > 40 mg.;Short Term: Participant states understanding of desired cholesterol values and is compliant with medications prescribed. Participant is following exercise prescription and nutrition guidelines.   Stress Yes   Intervention Offer individual and/or small group education and counseling on adjustment to heart disease, stress management and health-related lifestyle change. Teach and support self-help strategies.;Refer participants experiencing significant psychosocial distress to appropriate mental health specialists for further evaluation and treatment. When possible, include family members and significant others in education/counseling sessions.   Expected Outcomes Long Term: Emotional wellbeing is indicated by absence of clinically significant psychosocial distress or social isolation.;Short Term: Participant demonstrates changes in health-related behavior, relaxation and other stress management skills, ability to obtain effective social support, and compliance with psychotropic medications if prescribed.   Personal Goal Other Yes   Personal Goal Have energy to travel. Have energy to organize house and work in garden.   Intervention Provide aerobic exercise prgram to help increase strength and stamina, increase in energy expenditure as evidenced by increasing MET level, and increase functional capacity as evidenced by 6-minute walk test.   Expected Outcomes Patient will have  sufficient energy [place]. Patient will have sufficient energy to do household chores including organizing household items  and garden.      Core Components/Risk Factors/Patient Goals Review:    Core Components/Risk Factors/Patient Goals at Discharge (Final Review):    ITP Comments:     ITP Comments    Row Name 06/28/17 0837           ITP Comments Dr. Fransico Him, Medical Director           Comments:  Patient attended orientation from [id number] to 1035 to review rules and guidelines for program. Completed 6 minute walk test, Intitial ITP, and exercise prescription.  VSS. Telemetry-sinus rhythm, NSSTT changes, unchanged from recent EKG, frequent PVC.   Asymptomatic.

## 2017-06-29 ENCOUNTER — Ambulatory Visit (INDEPENDENT_AMBULATORY_CARE_PROVIDER_SITE_OTHER): Payer: Self-pay | Admitting: Surgery

## 2017-06-29 ENCOUNTER — Encounter: Payer: Self-pay | Admitting: Surgery

## 2017-06-29 VITALS — BP 132/72 | HR 62 | Resp 16 | Ht 70.0 in | Wt 140.0 lb

## 2017-06-29 DIAGNOSIS — Z952 Presence of prosthetic heart valve: Secondary | ICD-10-CM

## 2017-06-29 DIAGNOSIS — S21109A Unspecified open wound of unspecified front wall of thorax without penetration into thoracic cavity, initial encounter: Secondary | ICD-10-CM

## 2017-06-29 DIAGNOSIS — I350 Nonrheumatic aortic (valve) stenosis: Secondary | ICD-10-CM

## 2017-06-29 NOTE — Progress Notes (Signed)
     HPI:  Mr. Unk Lightning returns today for examination of his chest incision. He has been doing well and went for his cardiac rehab orientation. He denies any fever or chills. He has not noted any swelling, pain or drainage from the chest incision. He saw Dr. Baxter Flattery back for follow up and his CRP and Sed Rate have normalized. His antibiotics have been stopped. He is planning to return to [place] on 9/11 for two weeks to see his mother.  Current Outpatient Prescriptions  Medication Sig Dispense Refill  . aspirin EC 325 MG tablet Take 1 tablet (325 mg total) by mouth daily.    . eszopiclone (LUNESTA) 1 MG TABS tablet Take 1 mg by mouth at bedtime as needed for sleep (take 1-2mg  qHS PRN). Take immediately before bedtime    . nitroGLYCERIN (NITROSTAT) 0.4 MG SL tablet     . traMADol (ULTRAM) 50 MG tablet Take 1 tablet (50 mg total) by mouth every 6 (six) hours as needed for severe pain. (Patient not taking: Reported on 06/29/2017) 30 tablet 0   No current facility-administered medications for this visit.      Physical Exam: BP 132/72 (BP Location: Right Arm, Patient Position: Sitting, Cuff Size: Normal)   Pulse 62   Resp 16   Ht 5\' 10"  (1.778 m)   Wt 140 lb (63.5 kg)   SpO2 98% Comment: RA  BMI 20.09 kg/m  He looks well The chest incision is well-healed with no signs of infection. Sternum is stable Cardiac exam shows a regular rate and rhythm with normal heart sounds.  Diagnostic Tests: None today  Impression:  The chest incision looks good. I don't think there is any further intervention needed at this time and hopefully he will not have any further problems with it.  Plan:  He will continue to follow up with Dr. Inda Merlin and Dr. Irish Lack and will contact me immediately if he has any change in the appearance of his chest incisions.   Gaye Pollack, MD [facility] [phone]

## 2017-07-04 ENCOUNTER — Encounter (HOSPITAL_COMMUNITY)
Admission: RE | Admit: 2017-07-04 | Discharge: 2017-07-04 | Disposition: A | Discharge status: Home / Self Care | Payer: Medicare Other | Source: Ambulatory Visit | Attending: Interventional Cardiology | Admitting: Interventional Cardiology

## 2017-07-04 ENCOUNTER — Encounter (HOSPITAL_COMMUNITY): Payer: Medicare Other

## 2017-07-04 DIAGNOSIS — Z952 Presence of prosthetic heart valve: Secondary | ICD-10-CM

## 2017-07-04 DIAGNOSIS — Z953 Presence of xenogenic heart valve: Secondary | ICD-10-CM | POA: Diagnosis not present

## 2017-07-04 DIAGNOSIS — Z48812 Encounter for surgical aftercare following surgery on the circulatory system: Secondary | ICD-10-CM | POA: Diagnosis not present

## 2017-07-04 NOTE — Progress Notes (Signed)
Daily Session Note  Patient Details  Name: Darin Simmons MRN: [id number] Date of Birth: 1941/02/18 Referring Provider:     CARDIAC REHAB PHASE II ORIENTATION from 06/28/2017 in [place]  Referring Provider  Larae Grooms, MD.      Encounter Date: 07/04/2017  Check In:   Capillary Blood Glucose: No results found for this or any previous visit (from the past 24 hour(s)).      Exercise Prescription Changes - 07/05/17 1100      Response to Exercise   Blood Pressure (Admit) 132/74   Blood Pressure (Exercise) 144/82   Blood Pressure (Exit) 104/62   Heart Rate (Admit) 72 bpm   Heart Rate (Exercise) 117 bpm   Heart Rate (Exit) 77 bpm   Rating of Perceived Exertion (Exercise) 12   Symptoms none   Comments Pt was oriented to exercise equipment on 07/04/17   Duration Continue with 30 min of aerobic exercise without signs/symptoms of physical distress.   Intensity THRR unchanged     Progression   Progression Continue to progress workloads to maintain intensity without signs/symptoms of physical distress.   Average METs 3.6     Resistance Training   Training Prescription Yes   Weight 3lbs.   Reps 10-15   Time 10 Minutes     Bike   Level 1.2   Minutes 10   METs 4.48     NuStep   Level 4   SPM 80   Minutes 10   METs 2.7     Track   Laps 15   Minutes 10   METs 3.6      History  Smoking Status  . Former Smoker  . Packs/day: 1.00  . Years: 50.00  . Types: Cigarettes  . Quit date: 11/22/2008  Smokeless Tobacco  . Never Used    Goals Met:  Exercise tolerated well  Goals Unmet:  Not Applicable  Comments: [name] started cardiac rehab today.  Pt tolerated light exercise without difficulty. VSS, telemetry-Sinus Rhythm with PVC's this has been previously documented, asymptomatic.  Medication list reconciled. Pt denies barriers to medicaiton compliance.  PSYCHOSOCIAL ASSESSMENT:  PHQ-1. Vignesh says he gets depressed at times  But  denies being depressed currently. Prince says he lives alone and has a homeless woman living in his front room.     Pt enjoyed travelling when he was younger and has been enjoying taking short walks since his surgery   Pt oriented to exercise equipment and routine.    Understanding verbalized. Emotional support offered.Will continue to monitor the patient throughout  the program.Maria Venetia Maxon, RN,BSN 07/05/2017 12:33 PM    Dr. Fransico Him is Medical Director for Cardiac Rehab at [facility]

## 2017-07-06 ENCOUNTER — Encounter (HOSPITAL_COMMUNITY)
Admission: RE | Admit: 2017-07-06 | Discharge: 2017-07-06 | Disposition: A | Discharge status: Home / Self Care | Payer: Medicare Other | Source: Ambulatory Visit | Attending: Interventional Cardiology | Admitting: Interventional Cardiology

## 2017-07-06 ENCOUNTER — Encounter (HOSPITAL_COMMUNITY): Payer: Medicare Other

## 2017-07-06 DIAGNOSIS — Z953 Presence of xenogenic heart valve: Secondary | ICD-10-CM | POA: Diagnosis not present

## 2017-07-06 DIAGNOSIS — Z48812 Encounter for surgical aftercare following surgery on the circulatory system: Secondary | ICD-10-CM | POA: Diagnosis not present

## 2017-07-06 DIAGNOSIS — Z952 Presence of prosthetic heart valve: Secondary | ICD-10-CM

## 2017-07-06 NOTE — Progress Notes (Signed)
Cardiac Individual Treatment Plan  Patient Details  Name: Darin Simmons MRN: [id number] Date of Birth: 03-28-1941 Referring Provider:     CARDIAC REHAB PHASE II ORIENTATION from 06/28/2017 in [place]  Referring Provider  Larae Grooms, MD.      Initial Encounter Date:    CARDIAC REHAB PHASE II ORIENTATION from 06/28/2017 in [place]  Date  06/28/17  Referring Provider  Larae Grooms, MD.      Visit Diagnosis: S/P AVR  Patient's Home Medications on Admission:  Current Outpatient Prescriptions:  .  aspirin EC 325 MG tablet, Take 1 tablet (325 mg total) by mouth daily., Disp: , Rfl:  .  eszopiclone (LUNESTA) 1 MG TABS tablet, Take 2 mg by mouth at bedtime as needed for sleep (take 1-87m qHS PRN). Take immediately before bedtime , Disp: , Rfl:  .  nitroGLYCERIN (NITROSTAT) 0.4 MG SL tablet, , Disp: , Rfl:  .  traMADol (ULTRAM) 50 MG tablet, Take 1 tablet (50 mg total) by mouth every 6 (six) hours as needed for severe pain. (Patient not taking: Reported on 07/04/2017), Disp: 30 tablet, Rfl: 0  Past Medical History: Past Medical History:  Diagnosis Date  . 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 Aortic stenosis   . Arthritis   . BPH (benign prostatic hyperplasia)   . CAD (coronary artery disease) 2004  . Dyspnea   . [place]  . Heart murmur   . Hypercholesterolemia   . 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 PVD (peripheral vascular disease) ([place]   . Wound infection 04/2017    Tobacco Use: History  Smoking Status  . Former Smoker  . Packs/day: 1.00  . Years: 50.00  . Types: Cigarettes  . Quit date: 11/22/2008  Smokeless Tobacco  . Never Used    Labs: Recent Review Flowsheet Data    Labs for ITP Cardiac and Pulmonary Rehab Latest Ref Rng & Units 04/22/2017 04/22/2017 04/22/2017 04/23/2017 05/05/2017   Cholestrol 100 - 199 mg/dL - - - - -   LDLCALC 0 - 99 mg/dL - - - - -   HDL >39  mg/dL - - - - -   Trlycerides 0 - 149 mg/dL - - - - -   Hemoglobin A1c 4.8 - 5.6 % - - - - 5.1   PHART 7.350 - 7.450 7.480(H) 7.352 - - -   PCO2ART 32.0 - 48.0 mmHg 29.5(L) 38.7 - - -   HCO3 20.0 - 28.0 mmol/L 21.9 21.3 - - -   TCO2 0 - 100 mmol/L _0 -   ACIDBASEDEF 0.0 - 2.0 mmol/L 1.0 4.0(H) - - -   O2SAT % 99.0 98.0 - - -      Capillary Blood Glucose: Lab Results  Component Value Date   GLUCAP 84 04/24/2017   GLUCAP 116 (H) 04/24/2017   GLUCAP 82 04/23/2017   GLUCAP 147 (H) 04/23/2017   GLUCAP 120 (H) 04/23/2017     Exercise Target Goals:    Exercise Program Goal: Individual exercise prescription set with THRR, safety & activity barriers. Participant demonstrates ability to understand and report RPE using BORG scale, to self-measure pulse accurately, and to acknowledge the importance of the exercise prescription.  Exercise Prescription Goal: Starting with aerobic activity 30 plus minutes a day, 3 days per week for initial exercise prescription. Provide home exercise prescription and guidelines that participant acknowledges understanding prior to discharge.  Activity Barriers & Risk Stratification:     Activity Barriers & Cardiac Risk Stratification - 06/28/17 1021      Activity Barriers & Cardiac Risk Stratification   Activity Barriers None   Cardiac Risk Stratification Moderate      6 Minute Walk:     6 Minute Walk    Row Name 06/28/17 1018         6 Minute Walk   Phase Initial     Distance 2016 feet     Walk Time 6 minutes     # of Rest Breaks 0     MPH 3.82     METS 4.61     RPE 12     VO2 Peak 16.13     Symptoms No     Resting HR 76 bpm     Resting BP 112/64     Max Ex. HR 104 bpm     Max Ex. BP 150/70     2 Minute Post BP 118/66        Oxygen Initial Assessment:   Oxygen Re-Evaluation:   Oxygen Discharge (Final Oxygen Re-Evaluation):   Initial Exercise Prescription:     Initial Exercise Prescription - 06/28/17 1000       Date of Initial Exercise RX and Referring Provider   Date 06/28/17   Referring Provider Larae Grooms, MD.     Bike   Level 1.2   Minutes 10   METs 4.48     NuStep   Level 4   SPM 85   Minutes 10   METs 3     Track   Laps 13   Minutes 10   METs 3.26     Prescription Details   Frequency (times per week) 3   Duration Progress to 30 minutes of continuous aerobic without signs/symptoms of physical distress     Intensity   THRR 40-80% of Max Heartrate 58-117   Ratings of Perceived Exertion 11-13   Perceived Dyspnea 0-4     Progression   Progression Continue to progress workloads to maintain intensity without signs/symptoms of physical distress.     Resistance Training   Training Prescription Yes   Weight 3lbs.   Reps 10-15      Perform Capillary Blood Glucose checks as needed.  Exercise Prescription Changes:     Exercise Prescription Changes    Row Name 07/05/17 1100             Response to Exercise   Blood Pressure (Admit) 132/74       Blood Pressure (Exercise) 144/82       Blood Pressure (Exit) 104/62       Heart Rate (Admit) 72 bpm       Heart Rate (Exercise) 117 bpm       Heart Rate (Exit) 77 bpm       Rating of Perceived Exertion (Exercise) 12       Symptoms none       Comments Pt was oriented to exercise equipment on 07/04/17       Duration Continue with 30 min of aerobic exercise without signs/symptoms of physical distress.       Intensity THRR unchanged         Progression   Progression Continue to progress workloads to maintain intensity without signs/symptoms of physical distress.       Average METs 3.6         Resistance Training   Training Prescription Yes  Weight 3lbs.       Reps 10-15       Time 10 Minutes         Bike   Level 1.2       Minutes 10       METs 4.48         NuStep   Level 4       SPM 80       Minutes 10       METs 2.7         Track   Laps 15       Minutes 10       METs 3.6          Exercise  Comments:     Exercise Comments    Row Name 07/04/17 1145           Exercise Comments Pt was oriented to exercise equipment today. Pt responded well to exercise session and denied signs/symptoms of CP, dizziness or unusual SOB. Will continue to monitor pt's actvity levels.          Exercise Goals and Review:     Exercise Goals    Row Name 06/28/17 1024             Exercise Goals   Increase Physical Activity Yes       Intervention Provide advice, education, support and counseling about physical activity/exercise needs.;Develop an individualized exercise prescription for aerobic and resistive training based on initial evaluation findings, risk stratification, comorbidities and participant's personal goals.       Expected Outcomes Achievement of increased cardiorespiratory fitness and enhanced flexibility, muscular endurance and strength shown through measurements of functional capacity and personal statement of participant.       Increase Strength and Stamina Yes       Intervention Provide advice, education, support and counseling about physical activity/exercise needs.;Develop an individualized exercise prescription for aerobic and resistive training based on initial evaluation findings, risk stratification, comorbidities and participant's personal goals.       Expected Outcomes Achievement of increased cardiorespiratory fitness and enhanced flexibility, muscular endurance and strength shown through measurements of functional capacity and personal statement of participant.          Exercise Goals Re-Evaluation :    Discharge Exercise Prescription (Final Exercise Prescription Changes):     Exercise Prescription Changes - 07/05/17 1100      Response to Exercise   Blood Pressure (Admit) 132/74   Blood Pressure (Exercise) 144/82   Blood Pressure (Exit) 104/62   Heart Rate (Admit) 72 bpm   Heart Rate (Exercise) 117 bpm   Heart Rate (Exit) 77 bpm   Rating of Perceived  Exertion (Exercise) 12   Symptoms none   Comments Pt was oriented to exercise equipment on 07/04/17   Duration Continue with 30 min of aerobic exercise without signs/symptoms of physical distress.   Intensity THRR unchanged     Progression   Progression Continue to progress workloads to maintain intensity without signs/symptoms of physical distress.   Average METs 3.6     Resistance Training   Training Prescription Yes   Weight 3lbs.   Reps 10-15   Time 10 Minutes     Bike   Level 1.2   Minutes 10   METs 4.48     NuStep   Level 4   SPM 80   Minutes 10   METs 2.7     Track   Laps 15   Minutes 10  METs 3.6      Nutrition:  Target Goals: Understanding of nutrition guidelines, daily intake of sodium <154m, cholesterol <2054m calories 30% from fat and 7% or less from saturated fats, daily to have 5 or more servings of fruits and vegetables.  Biometrics:     Pre Biometrics - 06/28/17 1022      Pre Biometrics   Height 5' 10" (1.778 m)   Weight 142 lb 10.2 oz (64.7 kg)   Waist Circumference 31.5 inches   Hip Circumference 36 inches   Waist to Hip Ratio 0.88 %   BMI (Calculated) 20.5   Triceps Skinfold 12 mm   % Body Fat 20.1 %   Grip Strength 33.5 kg   Flexibility 9 in   Single Leg Stand 21 seconds       Nutrition Therapy Plan and Nutrition Goals:     Nutrition Therapy & Goals - 06/28/17 1412      Nutrition Therapy   Diet Therapeutic Lifestyle Changes     Personal Nutrition Goals   Nutrition Goal Pt to describe the benefit of including fruits, vegetables, whole grains, and low-fat dairy products in a heart healthy meal plan.     Intervention Plan   Intervention Prescribe, educate and counsel regarding individualized specific dietary modifications aiming towards targeted core components such as weight, hypertension, lipid management, diabetes, heart failure and other comorbidities.   Expected Outcomes Short Term Goal: Understand basic principles of  dietary content, such as calories, fat, sodium, cholesterol and nutrients.;Long Term Goal: Adherence to prescribed nutrition plan.      Nutrition Discharge: Nutrition Scores:     Nutrition Assessments - 06/28/17 1413      MEDFICTS Scores   Pre Score 68      Nutrition Goals Re-Evaluation:   Nutrition Goals Re-Evaluation:   Nutrition Goals Discharge (Final Nutrition Goals Re-Evaluation):   Psychosocial: Target Goals: Acknowledge presence or absence of significant depression and/or stress, maximize coping skills, provide positive support system. Participant is able to verbalize types and ability to use techniques and skills needed for reducing stress and depression.  Initial Review & Psychosocial Screening:     Initial Psych Review & Screening - 06/28/17 1154      Initial Review   Current issues with Current Stress Concerns   Source of Stress Concerns Chronic Illness   Comments upon bried assessment, pt exhibits health related anxiety      Family Dynamics   Good Support System? Yes  pt identifies himself as support, spouse and friends as positive influence      Barriers   Psychosocial barriers to participate in program There are no identifiable barriers or psychosocial needs.     Screening Interventions   Interventions Encouraged to exercise;Provide feedback about the scores to participant      Quality of Life Scores:     Quality of Life - 06/28/17 1037      Quality of Life Scores   Health/Function Pre 22.57 %   Socioeconomic Pre 22.21 %   Psych/Spiritual Pre 21.75 %   Family Pre 22.5 %   GLOBAL Pre 22.32 %      PHQ-9: Recent Review Flowsheet Data    [facility] 07/[facility]2017 06/21/2017   Decreased Interest 0 0   Down, Depressed, Hopeless 1 1   PHQ - 2 Score 1 1     Interpretation of Total Score  Total Score Depression Severity:  1-4 = Minimal depression, 5-9 = Mild depression, 10-14 = Moderate depression, 15-19 =  Moderately severe  depression, 20-27 = Severe depression   Psychosocial Evaluation and Intervention:     Psychosocial Evaluation - 07/06/17 1741      Psychosocial Evaluation & Interventions   Interventions Encouraged to exercise with the program and follow exercise prescription;Stress management education;Relaxation education   Comments pt exhibits mild health related stress/anxiety.  This is decreasing with CR activities.  otherwise no apparent psychosocial needs identified, no interventions necessary    Expected Outcomes pt will exhibit positive outlook with good coping skills.    Continue Psychosocial Services  No Follow up required      Psychosocial Re-Evaluation:     Psychosocial Re-Evaluation    [place] Name 07/06/17 1743             Psychosocial Re-Evaluation   Current issues with Current Anxiety/Panic;Current Stress Concerns       Comments Health related stress/anxiety improving with CR activities.  otherwise no psychosocial needs identified, no interventions necessary.       Expected Outcomes pt will exhibit positive outlook with good coping skills.        Interventions Stress management education;Relaxation education;Encouraged to attend Cardiac Rehabilitation for the exercise       Continue Psychosocial Services  No Follow up required          Psychosocial Discharge (Final Psychosocial Re-Evaluation):     Psychosocial Re-Evaluation - 07/06/17 1743      Psychosocial Re-Evaluation   Current issues with Current Anxiety/Panic;Current Stress Concerns   Comments Health related stress/anxiety improving with CR activities.  otherwise no psychosocial needs identified, no interventions necessary.   Expected Outcomes pt will exhibit positive outlook with good coping skills.    Interventions Stress management education;Relaxation education;Encouraged to attend Cardiac Rehabilitation for the exercise   Continue Psychosocial Services  No Follow up required      Vocational  Rehabilitation: Provide vocational rehab assistance to qualifying candidates.   Vocational Rehab Evaluation & Intervention:   Education: Education Goals: Education classes will be provided on a weekly basis, covering required topics. Participant will state understanding/return demonstration of topics presented.  Learning Barriers/Preferences:     Learning Barriers/Preferences - 06/28/17 1025      Learning Barriers/Preferences   Learning Barriers Sight;Hearing   Learning Preferences Written Material      Education Topics: Count Your Pulse:  -Group instruction provided by verbal instruction, demonstration, patient participation and written materials to support subject.  Instructors address importance of being able to find your pulse and how to count your pulse when at home without a heart monitor.  Patients get hands on experience counting their pulse with staff help and individually.   Heart Attack, Angina, and Risk Factor Modification:  -Group instruction provided by verbal instruction, video, and written materials to support subject.  Instructors address signs and symptoms of angina and heart attacks.    Also discuss risk factors for heart disease and how to make changes to improve heart health risk factors.   Functional Fitness:  -Group instruction provided by verbal instruction, demonstration, patient participation, and written materials to support subject.  Instructors address safety measures for doing things around the house.  Discuss how to get up and down off the floor, how to pick things up properly, how to safely get out of a chair without assistance, and balance training.   Meditation and Mindfulness:  -Group instruction provided by verbal instruction, patient participation, and written materials to support subject.  Instructor addresses importance of mindfulness and meditation practice to help reduce  stress and improve awareness.  Instructor also leads participants  through a meditation exercise.    CARDIAC REHAB PHASE II EXERCISE from 07/06/2017 in [place]  Date  07/06/17  Instruction Review Code  2- meets goals/outcomes      Stretching for Flexibility and Mobility:  -Group instruction provided by verbal instruction, patient participation, and written materials to support subject.  Instructors lead participants through series of stretches that are designed to increase flexibility thus improving mobility.  These stretches are additional exercise for major muscle groups that are typically performed during regular warm up and cool down.   Hands Only CPR:  -Group verbal, video, and participation provides a basic overview of AHA guidelines for community CPR. Role-play of emergencies allow participants the opportunity to practice calling for help and chest compression technique with discussion of AED use.   Hypertension: -Group verbal and written instruction that provides a basic overview of hypertension including the most recent diagnostic guidelines, risk factor reduction with self-care instructions and medication management.    Nutrition I class: Heart Healthy Eating:  -Group instruction provided by PowerPoint slides, verbal discussion, and written materials to support subject matter. The instructor gives an explanation and review of the Therapeutic Lifestyle Changes diet recommendations, which includes a discussion on lipid goals, dietary fat, sodium, fiber, plant stanol/sterol esters, sugar, and the components of a well-balanced, healthy diet.   Nutrition II class: Lifestyle Skills:  -Group instruction provided by PowerPoint slides, verbal discussion, and written materials to support subject matter. The instructor gives an explanation and review of label reading, grocery shopping for heart health, heart healthy recipe modifications, and ways to make healthier choices when eating out.   Diabetes Question & Answer:   -Group instruction provided by PowerPoint slides, verbal discussion, and written materials to support subject matter. The instructor gives an explanation and review of diabetes co-morbidities, pre- and post-prandial blood glucose goals, pre-exercise blood glucose goals, signs, symptoms, and treatment of hypoglycemia and hyperglycemia, and foot care basics.   Diabetes Blitz:  -Group instruction provided by PowerPoint slides, verbal discussion, and written materials to support subject matter. The instructor gives an explanation and review of the physiology behind type 1 and type 2 diabetes, diabetes medications and rational behind using different medications, pre- and post-prandial blood glucose recommendations and Hemoglobin A1c goals, diabetes diet, and exercise including blood glucose guidelines for exercising safely.    Portion Distortion:  -Group instruction provided by PowerPoint slides, verbal discussion, written materials, and food models to support subject matter. The instructor gives an explanation of serving size versus portion size, changes in portions sizes over the last 20 years, and what consists of a serving from each food group.   Stress Management:  -Group instruction provided by verbal instruction, video, and written materials to support subject matter.  Instructors review role of stress in heart disease and how to cope with stress positively.     Exercising on Your Own:  -Group instruction provided by verbal instruction, power point, and written materials to support subject.  Instructors discuss benefits of exercise, components of exercise, frequency and intensity of exercise, and end points for exercise.  Also discuss use of nitroglycerin and activating EMS.  Review options of places to exercise outside of rehab.  Review guidelines for sex with heart disease.   Cardiac Drugs I:  -Group instruction provided by verbal instruction and written materials to support subject.   Instructor reviews cardiac drug classes: antiplatelets, anticoagulants, beta blockers, and statins.  Instructor discusses reasons, side effects, and lifestyle considerations for each drug class.   Cardiac Drugs II:  -Group instruction provided by verbal instruction and written materials to support subject.  Instructor reviews cardiac drug classes: angiotensin converting enzyme inhibitors (ACE-I), angiotensin II receptor blockers (ARBs), nitrates, and calcium channel blockers.  Instructor discusses reasons, side effects, and lifestyle considerations for each drug class.   Anatomy and Physiology of the Circulatory System:  Group verbal and written instruction and models provide basic cardiac anatomy and physiology, with the coronary electrical and arterial systems. Review of: AMI, Angina, Valve disease, Heart Failure, Peripheral Artery Disease, Cardiac Arrhythmia, Pacemakers, and the ICD.   Other Education:  -Group or individual verbal, written, or video instructions that support the educational goals of the cardiac rehab program.   Knowledge Questionnaire Score:     Knowledge Questionnaire Score - 06/28/17 1032      Knowledge Questionnaire Score   Pre Score 22/24      Core Components/Risk Factors/Patient Goals at Admission:     Personal Goals and Risk Factors at Admission - 06/28/17 1059      Core Components/Risk Factors/Patient Goals on Admission   Lipids Yes   Intervention Provide education and support for participant on nutrition & aerobic/resistive exercise along with prescribed medications to achieve LDL <21m, HDL >470m   Expected Outcomes Long Term: Cholesterol controlled with medications as prescribed, with individualized exercise RX and with personalized nutrition plan. Value goals: LDL < 7017mHDL > 40 mg.;Short Term: Participant states understanding of desired cholesterol values and is compliant with medications prescribed. Participant is following exercise prescription  and nutrition guidelines.   Stress Yes   Intervention Offer individual and/or small group education and counseling on adjustment to heart disease, stress management and health-related lifestyle change. Teach and support self-help strategies.;Refer participants experiencing significant psychosocial distress to appropriate mental health specialists for further evaluation and treatment. When possible, include family members and significant others in education/counseling sessions.   Expected Outcomes Long Term: Emotional wellbeing is indicated by absence of clinically significant psychosocial distress or social isolation.;Short Term: Participant demonstrates changes in health-related behavior, relaxation and other stress management skills, ability to obtain effective social support, and compliance with psychotropic medications if prescribed.   Personal Goal Other Yes   Personal Goal Have energy to travel. Have energy to organize house and work in garden.   Intervention Provide aerobic exercise prgram to help increase strength and stamina, increase in energy expenditure as evidenced by increasing MET level, and increase functional capacity as evidenced by 6-minute walk test.   Expected Outcomes Patient will have sufficient energy to travel and take trip to [place]. Patient will have sufficient energy to do household chores including organizing household items and garden.      Core Components/Risk Factors/Patient Goals Review:      Goals and Risk Factor Review    Row Name 07/06/17 1737             Core Components/Risk Factors/Patient Goals Review   Personal Goals Review Stress;Lipids;Other       Review pt with few CAD risk factors.  pt is looking forward to increased strength/stamina and energy to be able to visit [place] months. pt seems eager to participate in CR activities       Expected Outcomes pt will participate in CR exercise, nutrition and education opportunities  to decrease overall RF.            Core Components/Risk Factors/Patient  Goals at Discharge (Final Review):      Goals and Risk Factor Review - 07/06/17 1737      Core Components/Risk Factors/Patient Goals Review   Personal Goals Review Stress;Lipids;Other   Review pt with few CAD risk factors.  pt is looking forward to increased strength/stamina and energy to be able to visit [place] in a few months. pt seems eager to participate in CR activities   Expected Outcomes pt will participate in CR exercise, nutrition and education opportunities to decrease overall RF.        ITP Comments:     ITP Comments    Row Name 06/28/17 [phone]           ITP Comments Dr. Fransico Him, Medical Director           Comments: Pt is making expected progress toward personal goals after completing 2 sessions. Recommend continued exercise and life style modification education including  stress management and relaxation techniques to decrease cardiac risk profile.

## 2017-07-08 ENCOUNTER — Encounter (HOSPITAL_COMMUNITY): Payer: Medicare Other

## 2017-07-08 ENCOUNTER — Encounter (HOSPITAL_COMMUNITY)
Admission: RE | Admit: 2017-07-08 | Discharge: 2017-07-08 | Disposition: A | Discharge status: Home / Self Care | Payer: Medicare Other | Source: Ambulatory Visit | Attending: Interventional Cardiology | Admitting: Interventional Cardiology

## 2017-07-08 DIAGNOSIS — Z953 Presence of xenogenic heart valve: Secondary | ICD-10-CM | POA: Diagnosis not present

## 2017-07-08 DIAGNOSIS — Z48812 Encounter for surgical aftercare following surgery on the circulatory system: Secondary | ICD-10-CM | POA: Diagnosis not present

## 2017-07-08 DIAGNOSIS — Z952 Presence of prosthetic heart valve: Secondary | ICD-10-CM

## 2017-07-08 DIAGNOSIS — H2513 Age-related nuclear cataract, bilateral: Secondary | ICD-10-CM | POA: Diagnosis not present

## 2017-07-08 DIAGNOSIS — H40033 Anatomical narrow angle, bilateral: Secondary | ICD-10-CM | POA: Diagnosis not present

## 2017-07-11 ENCOUNTER — Encounter (HOSPITAL_COMMUNITY)
Admission: RE | Admit: 2017-07-11 | Discharge: 2017-07-11 | Disposition: A | Discharge status: Home / Self Care | Payer: Medicare Other | Source: Ambulatory Visit | Attending: Interventional Cardiology | Admitting: Interventional Cardiology

## 2017-07-11 ENCOUNTER — Encounter (HOSPITAL_COMMUNITY): Payer: Medicare Other

## 2017-07-11 DIAGNOSIS — Z952 Presence of prosthetic heart valve: Secondary | ICD-10-CM

## 2017-07-11 DIAGNOSIS — Z48812 Encounter for surgical aftercare following surgery on the circulatory system: Secondary | ICD-10-CM | POA: Diagnosis not present

## 2017-07-11 DIAGNOSIS — Z953 Presence of xenogenic heart valve: Secondary | ICD-10-CM | POA: Diagnosis not present

## 2017-07-13 ENCOUNTER — Encounter (HOSPITAL_COMMUNITY): Payer: Medicare Other

## 2017-07-13 ENCOUNTER — Encounter (HOSPITAL_COMMUNITY)
Admission: RE | Admit: 2017-07-13 | Discharge: 2017-07-13 | Disposition: A | Discharge status: Home / Self Care | Payer: Medicare Other | Source: Ambulatory Visit | Attending: Interventional Cardiology | Admitting: Interventional Cardiology

## 2017-07-13 DIAGNOSIS — Z953 Presence of xenogenic heart valve: Secondary | ICD-10-CM | POA: Diagnosis not present

## 2017-07-13 DIAGNOSIS — Z952 Presence of prosthetic heart valve: Secondary | ICD-10-CM

## 2017-07-13 DIAGNOSIS — Z48812 Encounter for surgical aftercare following surgery on the circulatory system: Secondary | ICD-10-CM | POA: Diagnosis not present

## 2017-07-13 NOTE — Progress Notes (Signed)
Reviewed home exercise with pt today.  Pt plans to walk for exercise, 3x/week in addition to coming to cardiac rehab.  Reviewed THR, pulse, RPE, sign and symptoms, NTG use, and when to call 911 or MD.  Also discussed weather considerations and indoor options.  Pt voiced understanding.    Evo Aderman [employer]

## 2017-07-15 ENCOUNTER — Encounter (HOSPITAL_COMMUNITY)
Admission: RE | Admit: 2017-07-15 | Discharge: 2017-07-15 | Disposition: A | Discharge status: Home / Self Care | Payer: Medicare Other | Source: Ambulatory Visit | Attending: Interventional Cardiology | Admitting: Interventional Cardiology

## 2017-07-15 ENCOUNTER — Encounter (HOSPITAL_COMMUNITY): Payer: Medicare Other

## 2017-07-15 DIAGNOSIS — Z952 Presence of prosthetic heart valve: Secondary | ICD-10-CM

## 2017-07-15 DIAGNOSIS — Z953 Presence of xenogenic heart valve: Secondary | ICD-10-CM | POA: Diagnosis not present

## 2017-07-15 DIAGNOSIS — Z48812 Encounter for surgical aftercare following surgery on the circulatory system: Secondary | ICD-10-CM | POA: Diagnosis not present

## 2017-07-18 ENCOUNTER — Encounter (HOSPITAL_COMMUNITY): Payer: Medicare Other

## 2017-07-18 ENCOUNTER — Encounter (HOSPITAL_COMMUNITY)
Admission: RE | Admit: 2017-07-18 | Discharge: 2017-07-18 | Disposition: A | Discharge status: Home / Self Care | Payer: Medicare Other | Source: Ambulatory Visit | Attending: Interventional Cardiology | Admitting: Interventional Cardiology

## 2017-07-18 DIAGNOSIS — Z48812 Encounter for surgical aftercare following surgery on the circulatory system: Secondary | ICD-10-CM | POA: Diagnosis not present

## 2017-07-18 DIAGNOSIS — Z952 Presence of prosthetic heart valve: Secondary | ICD-10-CM

## 2017-07-18 DIAGNOSIS — Z953 Presence of xenogenic heart valve: Secondary | ICD-10-CM | POA: Diagnosis not present

## 2017-07-20 ENCOUNTER — Encounter (HOSPITAL_COMMUNITY): Payer: Medicare Other

## 2017-07-20 ENCOUNTER — Encounter (HOSPITAL_COMMUNITY)
Admission: RE | Admit: 2017-07-20 | Discharge: 2017-07-20 | Disposition: A | Discharge status: Home / Self Care | Payer: Medicare Other | Source: Ambulatory Visit | Attending: Interventional Cardiology | Admitting: Interventional Cardiology

## 2017-07-20 DIAGNOSIS — Z952 Presence of prosthetic heart valve: Secondary | ICD-10-CM

## 2017-07-20 DIAGNOSIS — Z48812 Encounter for surgical aftercare following surgery on the circulatory system: Secondary | ICD-10-CM | POA: Diagnosis not present

## 2017-07-20 DIAGNOSIS — Z953 Presence of xenogenic heart valve: Secondary | ICD-10-CM | POA: Diagnosis not present

## 2017-07-22 ENCOUNTER — Encounter (HOSPITAL_COMMUNITY): Payer: Medicare Other

## 2017-07-22 ENCOUNTER — Encounter (HOSPITAL_COMMUNITY)
Admission: RE | Admit: 2017-07-22 | Discharge: 2017-07-22 | Disposition: A | Discharge status: Home / Self Care | Payer: Medicare Other | Source: Ambulatory Visit | Attending: Interventional Cardiology | Admitting: Interventional Cardiology

## 2017-07-22 DIAGNOSIS — Z952 Presence of prosthetic heart valve: Secondary | ICD-10-CM

## 2017-07-22 DIAGNOSIS — Z953 Presence of xenogenic heart valve: Secondary | ICD-10-CM | POA: Diagnosis not present

## 2017-07-22 DIAGNOSIS — Z48812 Encounter for surgical aftercare following surgery on the circulatory system: Secondary | ICD-10-CM | POA: Diagnosis not present

## 2017-07-27 ENCOUNTER — Encounter (HOSPITAL_COMMUNITY): Payer: Medicare Other

## 2017-07-27 ENCOUNTER — Encounter (HOSPITAL_COMMUNITY)
Admission: RE | Admit: 2017-07-27 | Discharge: 2017-07-27 | Disposition: A | Discharge status: Home / Self Care | Payer: Medicare Other | Source: Ambulatory Visit | Attending: Interventional Cardiology | Admitting: Interventional Cardiology

## 2017-07-27 DIAGNOSIS — Z48812 Encounter for surgical aftercare following surgery on the circulatory system: Secondary | ICD-10-CM | POA: Diagnosis not present

## 2017-07-27 DIAGNOSIS — Z952 Presence of prosthetic heart valve: Secondary | ICD-10-CM

## 2017-07-27 DIAGNOSIS — Z953 Presence of xenogenic heart valve: Secondary | ICD-10-CM | POA: Diagnosis not present

## 2017-07-29 ENCOUNTER — Encounter (HOSPITAL_COMMUNITY)
Admission: RE | Admit: 2017-07-29 | Discharge: 2017-07-29 | Disposition: A | Discharge status: Home / Self Care | Payer: Medicare Other | Source: Ambulatory Visit | Attending: Interventional Cardiology | Admitting: Interventional Cardiology

## 2017-07-29 ENCOUNTER — Encounter (HOSPITAL_COMMUNITY): Payer: Medicare Other

## 2017-07-29 DIAGNOSIS — Z953 Presence of xenogenic heart valve: Secondary | ICD-10-CM | POA: Diagnosis not present

## 2017-07-29 DIAGNOSIS — Z48812 Encounter for surgical aftercare following surgery on the circulatory system: Secondary | ICD-10-CM | POA: Diagnosis not present

## 2017-07-29 DIAGNOSIS — Z952 Presence of prosthetic heart valve: Secondary | ICD-10-CM

## 2017-08-01 ENCOUNTER — Encounter (HOSPITAL_COMMUNITY)
Admission: RE | Admit: 2017-08-01 | Discharge: 2017-08-01 | Disposition: A | Discharge status: Home / Self Care | Payer: Medicare Other | Source: Ambulatory Visit | Attending: Interventional Cardiology | Admitting: Interventional Cardiology

## 2017-08-01 ENCOUNTER — Encounter (HOSPITAL_COMMUNITY): Payer: Medicare Other

## 2017-08-01 DIAGNOSIS — Z952 Presence of prosthetic heart valve: Secondary | ICD-10-CM

## 2017-08-01 DIAGNOSIS — Z953 Presence of xenogenic heart valve: Secondary | ICD-10-CM | POA: Diagnosis not present

## 2017-08-01 DIAGNOSIS — Z48812 Encounter for surgical aftercare following surgery on the circulatory system: Secondary | ICD-10-CM | POA: Diagnosis not present

## 2017-08-03 ENCOUNTER — Encounter (HOSPITAL_COMMUNITY): Payer: Medicare Other

## 2017-08-03 NOTE — Progress Notes (Signed)
Cardiac Individual Treatment Plan  Patient Details  Name: BERKLEY CRONKRIGHT MRN: [id number] Date of Birth: 03-28-1941 Referring Provider:     CARDIAC REHAB PHASE II ORIENTATION from 06/28/2017 in [place]  Referring Provider  Larae Grooms, MD.      Initial Encounter Date:    CARDIAC REHAB PHASE II ORIENTATION from 06/28/2017 in [place]  Date  06/28/17  Referring Provider  Larae Grooms, MD.      Visit Diagnosis: S/P AVR  Patient's Home Medications on Admission:  Current Outpatient Prescriptions:  .  aspirin EC 325 MG tablet, Take 1 tablet (325 mg total) by mouth daily., Disp: , Rfl:  .  eszopiclone (LUNESTA) 1 MG TABS tablet, Take 2 mg by mouth at bedtime as needed for sleep (take 1-87m qHS PRN). Take immediately before bedtime , Disp: , Rfl:  .  nitroGLYCERIN (NITROSTAT) 0.4 MG SL tablet, , Disp: , Rfl:  .  traMADol (ULTRAM) 50 MG tablet, Take 1 tablet (50 mg total) by mouth every 6 (six) hours as needed for severe pain. (Patient not taking: Reported on 07/04/2017), Disp: 30 tablet, Rfl: 0  Past Medical History: Past Medical History:  Diagnosis Date  . 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 Aortic stenosis   . Arthritis   . BPH (benign prostatic hyperplasia)   . CAD (coronary artery disease) 2004  . Dyspnea   . [place] [place]  . Heart murmur   . Hypercholesterolemia   . 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 PVD (peripheral vascular disease) ([facility]   . Wound infection 04/2017    Tobacco Use: History  Smoking Status  . Former Smoker  . Packs/day: 1.00  . Years: 50.00  . Types: Cigarettes  . Quit date: 11/22/2008  Smokeless Tobacco  . Never Used    Labs: Recent Review Flowsheet Data    Labs for ITP Cardiac and Pulmonary Rehab Latest Ref Rng & Units 04/22/2017 04/22/2017 04/22/2017 04/23/2017 05/05/2017   Cholestrol 100 - 199 mg/dL - - - - -   LDLCALC 0 - 99 mg/dL - - - - -   HDL >39  mg/dL - - - - -   Trlycerides 0 - 149 mg/dL - - - - -   Hemoglobin A1c 4.8 - 5.6 % - - - - 5.1   PHART 7.350 - 7.450 7.480(H) 7.352 - - -   PCO2ART 32.0 - 48.0 mmHg 29.5(L) 38.7 - - -   HCO3 20.0 - 28.0 mmol/L 21.9 21.3 - - -   TCO2 0 - 100 mmol/L _0 -   ACIDBASEDEF 0.0 - 2.0 mmol/L 1.0 4.0(H) - - -   O2SAT % 99.0 98.0 - - -      Capillary Blood Glucose: Lab Results  Component Value Date   GLUCAP 84 04/24/2017   GLUCAP 116 (H) 04/24/2017   GLUCAP 82 04/23/2017   GLUCAP 147 (H) 04/23/2017   GLUCAP 120 (H) 04/23/2017     Exercise Target Goals:    Exercise Program Goal: Individual exercise prescription set with THRR, safety & activity barriers. Participant demonstrates ability to understand and report RPE using BORG scale, to self-measure pulse accurately, and to acknowledge the importance of the exercise prescription.  Exercise Prescription Goal: Starting with aerobic activity 30 plus minutes a day, 3 days per week for initial exercise prescription. Provide home exercise prescription and guidelines that participant acknowledges understanding prior to discharge.  Activity Barriers & Risk Stratification:     Activity Barriers & Cardiac Risk Stratification - 06/28/17 1021      Activity Barriers & Cardiac Risk Stratification   Activity Barriers None   Cardiac Risk Stratification Moderate      6 Minute Walk:     6 Minute Walk    Row Name 06/28/17 1018         6 Minute Walk   Phase Initial     Distance 2016 feet     Walk Time 6 minutes     # of Rest Breaks 0     MPH 3.82     METS 4.61     RPE 12     VO2 Peak 16.13     Symptoms No     Resting HR 76 bpm     Resting BP 112/64     Max Ex. HR 104 bpm     Max Ex. BP 150/70     2 Minute Post BP 118/66        Oxygen Initial Assessment:   Oxygen Re-Evaluation:   Oxygen Discharge (Final Oxygen Re-Evaluation):   Initial Exercise Prescription:     Initial Exercise Prescription - 06/28/17 1000       Date of Initial Exercise RX and Referring Provider   Date 06/28/17   Referring Provider Larae Grooms, MD.     Bike   Level 1.2   Minutes 10   METs 4.48     NuStep   Level 4   SPM 85   Minutes 10   METs 3     Track   Laps 13   Minutes 10   METs 3.26     Prescription Details   Frequency (times per week) 3   Duration Progress to 30 minutes of continuous aerobic without signs/symptoms of physical distress     Intensity   THRR 40-80% of Max Heartrate 58-117   Ratings of Perceived Exertion 11-13   Perceived Dyspnea 0-4     Progression   Progression Continue to progress workloads to maintain intensity without signs/symptoms of physical distress.     Resistance Training   Training Prescription Yes   Weight 3lbs.   Reps 10-15      Perform Capillary Blood Glucose checks as needed.  Exercise Prescription Changes:     Exercise Prescription Changes    Row Name 07/05/17 1100 07/22/17 1516 08/01/17 1600         Response to Exercise   Blood Pressure (Admit) 132/74 114/70 126/62     Blood Pressure (Exercise) 144/82 121/62 152/70     Blood Pressure (Exit) 104/62 112/60 108/62     Heart Rate (Admit) 72 bpm 71 bpm 75 bpm     Heart Rate (Exercise) 117 bpm 99 bpm 94 bpm     Heart Rate (Exit) 77 bpm 70 bpm 74 bpm     Rating of Perceived Exertion (Exercise) '12 15 14     '$ Symptoms none none mild dizziness this morning when bending over to tie shoes     Comments Pt was oriented to exercise equipment on 07/04/17  - pt did not feel like himself. Pt did not work as hard in cardiac rehab today     Duration Continue with 30 min of aerobic exercise without signs/symptoms of physical distress. Continue with 30 min of aerobic exercise without signs/symptoms of physical distress. Continue with 30 min of aerobic exercise without signs/symptoms of physical distress.     Intensity THRR  unchanged THRR unchanged THRR unchanged       Progression   Progression Continue to progress  workloads to maintain intensity without signs/symptoms of physical distress. Continue to progress workloads to maintain intensity without signs/symptoms of physical distress. Continue to progress workloads to maintain intensity without signs/symptoms of physical distress.     Average METs 3.6 4 3.5       Resistance Training   Training Prescription Yes Yes Yes     Weight 3lbs. 5lbs 4lbs     Reps 10-15 10-15 10-15     Time 10 Minutes 10 Minutes 10 Minutes       Bike   Level 1.2 1.3 1.3     Minutes '10 10 10     '$ METs 4.48 4.65 4.65       NuStep   Level '4 4 4     '$ SPM 80 80 70     Minutes 10 90 90     METs 2.7 3.6 2.7       Track   Laps '15 16 13     '$ Minutes '10 10 10     '$ METs 3.6 3.79 3.26       Home Exercise Plan   Plans to continue exercise at  - Home (comment) Home (comment)     Frequency  - Add 3 additional days to program exercise sessions. Add 3 additional days to program exercise sessions.     Initial Home Exercises Provided  - 07/13/17 07/13/17        Exercise Comments:     Exercise Comments    Row Name 07/04/17 1145 08/02/17 0820         Exercise Comments Pt was oriented to exercise equipment today. Pt responded well to exercise session and denied signs/symptoms of CP, dizziness or unusual SOB. Will continue to monitor pt's actvity levels. Reviewed METs and goals. Pt is tolerating exercises and WL increases very well; will continue to monitor pt's progress and actvity levels.         Exercise Goals and Review:     Exercise Goals    Row Name 06/28/17 1024             Exercise Goals   Increase Physical Activity Yes       Intervention Provide advice, education, support and counseling about physical activity/exercise needs.;Develop an individualized exercise prescription for aerobic and resistive training based on initial evaluation findings, risk stratification, comorbidities and participant's personal goals.       Expected Outcomes Achievement of increased  cardiorespiratory fitness and enhanced flexibility, muscular endurance and strength shown through measurements of functional capacity and personal statement of participant.       Increase Strength and Stamina Yes       Intervention Provide advice, education, support and counseling about physical activity/exercise needs.;Develop an individualized exercise prescription for aerobic and resistive training based on initial evaluation findings, risk stratification, comorbidities and participant's personal goals.       Expected Outcomes Achievement of increased cardiorespiratory fitness and enhanced flexibility, muscular endurance and strength shown through measurements of functional capacity and personal statement of participant.          Exercise Goals Re-Evaluation :     Exercise Goals Re-Evaluation    Row Name 07/13/17 1044 08/02/17 0821           Exercise Goal Re-Evaluation   Exercise Goals Review Increase Strenth and Stamina;Increase Physical Activity Increase Physical Activity;Able to understand and use rate of perceived exertion (RPE)  scale;Knowledge and understanding of Target Heart Rate Range (THRR);Understanding of Exercise Prescription;Increase Strength and Stamina;Able to check pulse independently      Comments Reviewed home exercise with pt today.  Pt plans to walk for exercise, 3x/week in addition to coming to cardiac rehab.  Reviewed THR, pulse, RPE, sign and symptoms, NTG use, and when to call 911 or MD.  Also discussed weather considerations and indoor options.  Pt voiced understanding. Pt is actively involved with HEP and is walking at home 3x/week in addition to coming to cardiac rehab. pt stated, " having increase understanding exercise/activuty limitations." Pt also stated that UE strength is increasing and seeing more muscle definition throughout.      Expected Outcomes Pt will be compliant with HEP and improve in cardiorespiratory fitness. Pt will be compliant with HEP and improve  in cardiorespiratory fitness and overall strength.          Discharge Exercise Prescription (Final Exercise Prescription Changes):     Exercise Prescription Changes - 08/01/17 1600      Response to Exercise   Blood Pressure (Admit) 126/62   Blood Pressure (Exercise) 152/70   Blood Pressure (Exit) 108/62   Heart Rate (Admit) 75 bpm   Heart Rate (Exercise) 94 bpm   Heart Rate (Exit) 74 bpm   Rating of Perceived Exertion (Exercise) 14   Symptoms mild dizziness this morning when bending over to tie shoes   Comments pt did not feel like himself. Pt did not work as hard in cardiac rehab today   Duration Continue with 30 min of aerobic exercise without signs/symptoms of physical distress.   Intensity THRR unchanged     Progression   Progression Continue to progress workloads to maintain intensity without signs/symptoms of physical distress.   Average METs 3.5     Resistance Training   Training Prescription Yes   Weight 4lbs   Reps 10-15   Time 10 Minutes     Bike   Level 1.3   Minutes 10   METs 4.65     NuStep   Level 4   SPM 70   Minutes 90   METs 2.7     Track   Laps 13   Minutes 10   METs 3.26     Home Exercise Plan   Plans to continue exercise at Home (comment)   Frequency Add 3 additional days to program exercise sessions.   Initial Home Exercises Provided 07/13/17      Nutrition:  Target Goals: Understanding of nutrition guidelines, daily intake of sodium '1500mg'$ , cholesterol '200mg'$ , calories 30% from fat and 7% or less from saturated fats, daily to have 5 or more servings of fruits and vegetables.  Biometrics:     Pre Biometrics - 06/28/17 1022      Pre Biometrics   Height '5\' 10"'$  (1.778 m)   Weight 142 lb 10.2 oz (64.7 kg)   Waist Circumference 31.5 inches   Hip Circumference 36 inches   Waist to Hip Ratio 0.88 %   BMI (Calculated) 20.5   Triceps Skinfold 12 mm   % Body Fat 20.1 %   Grip Strength 33.5 kg   Flexibility 9 in   Single Leg Stand  21 seconds       Nutrition Therapy Plan and Nutrition Goals:     Nutrition Therapy & Goals - 06/28/17 1412      Nutrition Therapy   Diet Therapeutic Lifestyle Changes     Personal Nutrition Goals   Nutrition Goal  Pt to describe the benefit of including fruits, vegetables, whole grains, and low-fat dairy products in a heart healthy meal plan.     Intervention Plan   Intervention Prescribe, educate and counsel regarding individualized specific dietary modifications aiming towards targeted core components such as weight, hypertension, lipid management, diabetes, heart failure and other comorbidities.   Expected Outcomes Short Term Goal: Understand basic principles of dietary content, such as calories, fat, sodium, cholesterol and nutrients.;Long Term Goal: Adherence to prescribed nutrition plan.      Nutrition Discharge: Nutrition Scores:     Nutrition Assessments - 06/28/17 1413      MEDFICTS Scores   Pre Score 68      Nutrition Goals Re-Evaluation:   Nutrition Goals Re-Evaluation:   Nutrition Goals Discharge (Final Nutrition Goals Re-Evaluation):   Psychosocial: Target Goals: Acknowledge presence or absence of significant depression and/or stress, maximize coping skills, provide positive support system. Participant is able to verbalize types and ability to use techniques and skills needed for reducing stress and depression.  Initial Review & Psychosocial Screening:     Initial Psych Review & Screening - 06/28/17 1154      Initial Review   Current issues with Current Stress Concerns   Source of Stress Concerns Chronic Illness   Comments upon bried assessment, pt exhibits health related anxiety      Family Dynamics   Good Support System? Yes  pt identifies himself as support, spouse and friends as positive influence      Barriers   Psychosocial barriers to participate in program There are no identifiable barriers or psychosocial needs.     Screening  Interventions   Interventions Encouraged to exercise;Provide feedback about the scores to participant      Quality of Life Scores:     Quality of Life - 07/15/17 1655      Quality of Life Scores   Family Pre --  pt is eager and anxious for trip to [place] next month to visit his mother. He is fearful the travel will be very taxing on his this year. pt instructed to contact airline for assistance. pt offered emotional support and reassurance.    GLOBAL Pre --  overall QOL scores good. pt has health related, aging related and generalized anxieties which he verbalizes good coping skills. pt encouraged to particpate in meditation/mindfulness class and stress mgmt class.       PHQ-9: Recent Review Flowsheet Data    Depression screen [facility] 2/9 07/04/2017 06/21/2017   Decreased Interest 0 0   Down, Depressed, Hopeless 1 1   PHQ - 2 Score 1 1     Interpretation of Total Score  Total Score Depression Severity:  1-4 = Minimal depression, 5-9 = Mild depression, 10-14 = Moderate depression, 15-19 = Moderately severe depression, 20-27 = Severe depression   Psychosocial Evaluation and Intervention:     Psychosocial Evaluation - 07/06/17 1741      Psychosocial Evaluation & Interventions   Interventions Encouraged to exercise with the program and follow exercise prescription;Stress management education;Relaxation education   Comments pt exhibits mild health related stress/anxiety.  This is decreasing with CR activities.  otherwise no apparent psychosocial needs identified, no interventions necessary    Expected Outcomes pt will exhibit positive outlook with good coping skills.    Continue Psychosocial Services  No Follow up required      Psychosocial Re-Evaluation:     Psychosocial Re-Evaluation    [place] Name 07/06/17 1743 07/29/17 1647  Psychosocial Re-Evaluation   Current issues with Current Anxiety/Panic;Current Stress Concerns Current Anxiety/Panic;Current Stress Concerns       Comments Health related stress/anxiety improving with CR activities.  otherwise no psychosocial needs identified, no interventions necessary. Health related stress/anxiety improving with CR activities. pt demonstrates decreased stress/anxiety with exercise and CR participation. pt enthusiastically interacts with his peers. pt verbalizes decreased anxiety about planned [place] trip next week.  otherwise no psychosocial needs identified, no interventions necessary.      Expected Outcomes pt will exhibit positive outlook with good coping skills.  pt will exhibit positive outlook with good coping skills.       Interventions Stress management education;Relaxation education;Encouraged to attend Cardiac Rehabilitation for the exercise Stress management education;Relaxation education;Encouraged to attend Cardiac Rehabilitation for the exercise      Continue Psychosocial Services  No Follow up required No Follow up required         Psychosocial Discharge (Final Psychosocial Re-Evaluation):     Psychosocial Re-Evaluation - 07/29/17 1647      Psychosocial Re-Evaluation   Current issues with Current Anxiety/Panic;Current Stress Concerns   Comments Health related stress/anxiety improving with CR activities. pt demonstrates decreased stress/anxiety with exercise and CR participation. pt enthusiastically interacts with his peers. pt verbalizes decreased anxiety about planned [place] trip next week.  otherwise no psychosocial needs identified, no interventions necessary.   Expected Outcomes pt will exhibit positive outlook with good coping skills.    Interventions Stress management education;Relaxation education;Encouraged to attend Cardiac Rehabilitation for the exercise   Continue Psychosocial Services  No Follow up required      Vocational Rehabilitation: Provide vocational rehab assistance to qualifying candidates.   Vocational Rehab Evaluation & Intervention:   Education: Education Goals:  Education classes will be provided on a weekly basis, covering required topics. Participant will state understanding/return demonstration of topics presented.  Learning Barriers/Preferences:     Learning Barriers/Preferences - 06/28/17 1025      Learning Barriers/Preferences   Learning Barriers Sight;Hearing   Learning Preferences Written Material      Education Topics: Count Your Pulse:  -Group instruction provided by verbal instruction, demonstration, patient participation and written materials to support subject.  Instructors address importance of being able to find your pulse and how to count your pulse when at home without a heart monitor.  Patients get hands on experience counting their pulse with staff help and individually.   CARDIAC REHAB PHASE II EXERCISE from 07/29/2017 in [place]  Date  07/29/17  Instruction Review Code  2- meets goals/outcomes      Heart Attack, Angina, and Risk Factor Modification:  -Group instruction provided by verbal instruction, video, and written materials to support subject.  Instructors address signs and symptoms of angina and heart attacks.    Also discuss risk factors for heart disease and how to make changes to improve heart health risk factors.   Functional Fitness:  -Group instruction provided by verbal instruction, demonstration, patient participation, and written materials to support subject.  Instructors address safety measures for doing things around the house.  Discuss how to get up and down off the floor, how to pick things up properly, how to safely get out of a chair without assistance, and balance training.   CARDIAC REHAB PHASE II EXERCISE from 07/29/2017 in [place]  Date  07/08/17  Instruction Review Code  2- meets goals/outcomes      Meditation and Mindfulness:  -Group instruction provided by  verbal instruction, patient participation, and written materials to  support subject.  Instructor addresses importance of mindfulness and meditation practice to help reduce stress and improve awareness.  Instructor also leads participants through a meditation exercise.    CARDIAC REHAB PHASE II EXERCISE from 07/29/2017 in [place]  Date  07/06/17  Instruction Review Code  2- meets goals/outcomes      Stretching for Flexibility and Mobility:  -Group instruction provided by verbal instruction, patient participation, and written materials to support subject.  Instructors lead participants through series of stretches that are designed to increase flexibility thus improving mobility.  These stretches are additional exercise for major muscle groups that are typically performed during regular warm up and cool down.   CARDIAC REHAB PHASE II EXERCISE from 07/29/2017 in [place]  Date  07/15/17  Instruction Review Code  2- meets goals/outcomes      Hands Only CPR:  -Group verbal, video, and participation provides a basic overview of AHA guidelines for community CPR. Role-play of emergencies allow participants the opportunity to practice calling for help and chest compression technique with discussion of AED use.   Hypertension: -Group verbal and written instruction that provides a basic overview of hypertension including the most recent diagnostic guidelines, risk factor reduction with self-care instructions and medication management.    Nutrition I class: Heart Healthy Eating:  -Group instruction provided by PowerPoint slides, verbal discussion, and written materials to support subject matter. The instructor gives an explanation and review of the Therapeutic Lifestyle Changes diet recommendations, which includes a discussion on lipid goals, dietary fat, sodium, fiber, plant stanol/sterol esters, sugar, and the components of a well-balanced, healthy diet.   Nutrition II class: Lifestyle Skills:  -Group  instruction provided by PowerPoint slides, verbal discussion, and written materials to support subject matter. The instructor gives an explanation and review of label reading, grocery shopping for heart health, heart healthy recipe modifications, and ways to make healthier choices when eating out.   Diabetes Question & Answer:  -Group instruction provided by PowerPoint slides, verbal discussion, and written materials to support subject matter. The instructor gives an explanation and review of diabetes co-morbidities, pre- and post-prandial blood glucose goals, pre-exercise blood glucose goals, signs, symptoms, and treatment of hypoglycemia and hyperglycemia, and foot care basics.   Diabetes Blitz:  -Group instruction provided by PowerPoint slides, verbal discussion, and written materials to support subject matter. The instructor gives an explanation and review of the physiology behind type 1 and type 2 diabetes, diabetes medications and rational behind using different medications, pre- and post-prandial blood glucose recommendations and Hemoglobin A1c goals, diabetes diet, and exercise including blood glucose guidelines for exercising safely.    Portion Distortion:  -Group instruction provided by PowerPoint slides, verbal discussion, written materials, and food models to support subject matter. The instructor gives an explanation of serving size versus portion size, changes in portions sizes over the last 20 years, and what consists of a serving from each food group.   CARDIAC REHAB PHASE II EXERCISE from 07/29/2017 in [place]  Date  07/13/17  Educator  RD  Instruction Review Code  2- meets goals/outcomes      Stress Management:  -Group instruction provided by verbal instruction, video, and written materials to support subject matter.  Instructors review role of stress in heart disease and how to cope with stress positively.     CARDIAC REHAB PHASE II EXERCISE  from 07/29/2017  in [place]  Date  07/20/17  Instruction Review Code  2- meets goals/outcomes      Exercising on Your Own:  -Group instruction provided by verbal instruction, power point, and written materials to support subject.  Instructors discuss benefits of exercise, components of exercise, frequency and intensity of exercise, and end points for exercise.  Also discuss use of nitroglycerin and activating EMS.  Review options of places to exercise outside of rehab.  Review guidelines for sex with heart disease.   CARDIAC REHAB PHASE II EXERCISE from 07/29/2017 in [place]  Date  07/27/17  Instruction Review Code  2- meets goals/outcomes      Cardiac Drugs I:  -Group instruction provided by verbal instruction and written materials to support subject.  Instructor reviews cardiac drug classes: antiplatelets, anticoagulants, beta blockers, and statins.  Instructor discusses reasons, side effects, and lifestyle considerations for each drug class.   Cardiac Drugs II:  -Group instruction provided by verbal instruction and written materials to support subject.  Instructor reviews cardiac drug classes: angiotensin converting enzyme inhibitors (ACE-I), angiotensin II receptor blockers (ARBs), nitrates, and calcium channel blockers.  Instructor discusses reasons, side effects, and lifestyle considerations for each drug class.   Anatomy and Physiology of the Circulatory System:  Group verbal and written instruction and models provide basic cardiac anatomy and physiology, with the coronary electrical and arterial systems. Review of: AMI, Angina, Valve disease, Heart Failure, Peripheral Artery Disease, Cardiac Arrhythmia, Pacemakers, and the ICD.   Other Education:  -Group or individual verbal, written, or video instructions that support the educational goals of the cardiac rehab program.   Knowledge Questionnaire Score:      Knowledge Questionnaire Score - 06/28/17 1032      Knowledge Questionnaire Score   Pre Score 22/24      Core Components/Risk Factors/Patient Goals at Admission:     Personal Goals and Risk Factors at Admission - 06/28/17 1059      Core Components/Risk Factors/Patient Goals on Admission   Lipids Yes   Intervention Provide education and support for participant on nutrition & aerobic/resistive exercise along with prescribed medications to achieve LDL '70mg'$ , HDL >'40mg'$ .   Expected Outcomes Long Term: Cholesterol controlled with medications as prescribed, with individualized exercise RX and with personalized nutrition plan. Value goals: LDL < '70mg'$ , HDL > 40 mg.;Short Term: Participant states understanding of desired cholesterol values and is compliant with medications prescribed. Participant is following exercise prescription and nutrition guidelines.   Stress Yes   Intervention Offer individual and/or small group education and counseling on adjustment to heart disease, stress management and health-related lifestyle change. Teach and support self-help strategies.;Refer participants experiencing significant psychosocial distress to appropriate mental health specialists for further evaluation and treatment. When possible, include family members and significant others in education/counseling sessions.   Expected Outcomes Long Term: Emotional wellbeing is indicated by absence of clinically significant psychosocial distress or social isolation.;Short Term: Participant demonstrates changes in health-related behavior, relaxation and other stress management skills, ability to obtain effective social support, and compliance with psychotropic medications if prescribed.   Personal Goal Other Yes   Personal Goal Have energy to travel. Have energy to organize house and work in garden.   Intervention Provide aerobic exercise prgram to help increase strength and stamina, increase in energy expenditure as evidenced  by increasing MET level, and increase functional capacity as evidenced by 6-minute walk test.   Expected Outcomes Patient will have sufficient energy to  travel and take trip to [place] in September 2018. Patient will have sufficient energy to do household chores including organizing household items and garden.      Core Components/Risk Factors/Patient Goals Review:      Goals and Risk Factor Review    Row Name 07/06/17 1737 07/29/17 1646           Core Components/Risk Factors/Patient Goals Review   Personal Goals Review Stress;Lipids;Other Stress;Lipids;Other      Review pt with few CAD risk factors.  pt is looking forward to increased strength/stamina and energy to be able to visit [place] in a few months. pt seems eager to participate in CR activities pt with few CAD risk factors.  pt is looking forward to increased strength/stamina and energy to be able to visit [place] this week. pt has made appropriate travel arrangements and is looking forward to his trip.  pt seems eager to participate in CR activities and enthusiastically interacts with his peers.         Expected Outcomes pt will participate in CR exercise, nutrition and education opportunities to decrease overall RF.   pt will participate in CR exercise, nutrition and education opportunities to decrease overall RF.           Core Components/Risk Factors/Patient Goals at Discharge (Final Review):      Goals and Risk Factor Review - 07/29/17 1646      Core Components/Risk Factors/Patient Goals Review   Personal Goals Review Stress;Lipids;Other   Review pt with few CAD risk factors.  pt is looking forward to increased strength/stamina and energy to be able to visit [place] this week. pt has made appropriate travel arrangements and is looking forward to his trip.  pt seems eager to participate in CR activities and enthusiastically interacts with his peers.      Expected Outcomes pt will participate in CR exercise, nutrition and  education opportunities to decrease overall RF.        ITP Comments:     ITP Comments    Row Name 06/28/17 [phone] 08/03/17 1616         ITP Comments Dr. Fransico Him, Medical Director  Dr. Fransico Him, Medical Director          Comments: Pt is making expected progress toward personal goals after completing 13 sessions. Recommend continued exercise and life style modification education including  stress management and relaxation techniques to decrease cardiac risk profile.

## 2017-08-05 ENCOUNTER — Encounter (HOSPITAL_COMMUNITY): Payer: Medicare Other

## 2017-08-08 ENCOUNTER — Encounter (HOSPITAL_COMMUNITY): Payer: Medicare Other

## 2017-08-10 ENCOUNTER — Encounter (HOSPITAL_COMMUNITY): Payer: Medicare Other

## 2017-08-12 ENCOUNTER — Encounter (HOSPITAL_COMMUNITY): Payer: Medicare Other

## 2017-08-15 ENCOUNTER — Encounter (HOSPITAL_COMMUNITY): Payer: Medicare Other

## 2017-08-17 ENCOUNTER — Encounter (HOSPITAL_COMMUNITY): Payer: Medicare Other

## 2017-08-19 ENCOUNTER — Encounter: Payer: Self-pay | Admitting: Internal Medicine

## 2017-08-19 ENCOUNTER — Encounter (HOSPITAL_COMMUNITY): Payer: Medicare Other

## 2017-08-19 ENCOUNTER — Encounter (HOSPITAL_COMMUNITY)
Admission: RE | Admit: 2017-08-19 | Discharge: 2017-08-19 | Disposition: A | Discharge status: Home / Self Care | Payer: Medicare Other | Source: Ambulatory Visit | Attending: Interventional Cardiology | Admitting: Interventional Cardiology

## 2017-08-19 ENCOUNTER — Ambulatory Visit (INDEPENDENT_AMBULATORY_CARE_PROVIDER_SITE_OTHER): Payer: Medicare Other | Admitting: Internal Medicine

## 2017-08-19 VITALS — BP 150/68 | HR 64 | Ht 70.0 in | Wt 154.0 lb

## 2017-08-19 DIAGNOSIS — Z48812 Encounter for surgical aftercare following surgery on the circulatory system: Secondary | ICD-10-CM | POA: Diagnosis not present

## 2017-08-19 DIAGNOSIS — R42 Dizziness and giddiness: Secondary | ICD-10-CM | POA: Diagnosis not present

## 2017-08-19 DIAGNOSIS — I472 Ventricular tachycardia, unspecified: Secondary | ICD-10-CM

## 2017-08-19 DIAGNOSIS — Z952 Presence of prosthetic heart valve: Secondary | ICD-10-CM

## 2017-08-19 DIAGNOSIS — Z953 Presence of xenogenic heart valve: Secondary | ICD-10-CM | POA: Diagnosis not present

## 2017-08-19 DIAGNOSIS — I25119 Atherosclerotic heart disease of native coronary artery with unspecified angina pectoris: Secondary | ICD-10-CM

## 2017-08-19 NOTE — Progress Notes (Signed)
Daily Session Note  Patient Details  Name: Darin Simmons MRN: [id number] Date of Birth: 1941-02-14 Referring Provider:     CARDIAC REHAB PHASE II ORIENTATION from 06/28/2017 in [place]  Referring Provider  Larae Grooms, MD.      Encounter Date: 08/19/2017  Check In:     Session Check In - 08/19/17 0833      Check-In   Location [facility]   Staff Present Su Hilt, MS, ACSM RCEP, Exercise Physiologist;Damonie Ellenwood, RN, BSN;Amber Fair, MS, ACSM RCEP, Exercise Physiologist   Supervising physician immediately available to respond to emergencies [facility] immediately available   Physician(s) Dr. Wendee Beavers   Medication changes reported     No   Fall or balance concerns reported    No   Tobacco Cessation No Change   Warm-up and Cool-down Performed as group-led instruction   Resistance Training Performed Yes   VAD Patient? No     Pain Assessment   Currently in Pain? No/denies   Multiple Pain Sites No      Capillary Blood Glucose: No results found for this or any previous visit (from the past 24 hour(s)).    History  Smoking Status  . Former Smoker  . Packs/day: 1.00  . Years: 50.00  . Types: Cigarettes  . Quit date: 11/22/2008  Smokeless Tobacco  . Never Used    Goals Met:  Exercise tolerated well  Goals Unmet:  Not Applicable  Comments: Pt arrived at cardiac rehab today c/o episodes of dizziness associated with visual disturbances. Pt reports episodes occur daily at rest.  Pt also c/o fatigue, occasional palpitations and dyspnea on exertion. Pt denies pain.   Orthostatic Vital Signs:   BP:  137/71     HR:    71    Lying  BP:  145/85  HR:   75     Sitting  BP:  141/0   HR:   75     Standing   Dr. Caryl Comes (DOD) made aware of pt symptoms.  Dr. Caryl Comes recommended pt be seen today at 12:30.  pt instructed to arrive at [place] office at 12:15 for 12:30 appt.  Written and verbal  instructions given. Pt verbalized understanding.      Dr. Fransico Him is Medical Director for Cardiac Rehab at [facility].

## 2017-08-19 NOTE — Progress Notes (Signed)
Patient Care Team: Darcus Austin, MD as PCP - General (Family Medicine)   HPI  Darin Simmons is a 76 y.o. male  Seen as an add-on today because of episodes of lightheadedness.  He has a history of coronary artery disease with prior DES stenting. He underwent AVR 6/18. 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 He underwent a total of 4 2 days of antibiotics.  He was noted postoperatively also to have nonsustained ventricular tachycardia  He had been making good progress in rehabilitation and went to go see his 60 year old mother about 10 days ago in [place]. All walking he suddenly noted significant fatigue. There is also heaviness in his legs. There was no complete chest pain or shortness of breath. This seemed to be relatively persistent over the next days.  This morning he had an episode where he was a transient loss of vision this lasted 5-10 seconds. His only other similar episode had been prior to his aortic valve replacement.  He's had some palpitations and he has noted some skipped heartbeats. Review of his telemetry at [facility] demonstrated nocturnal bradycardia nonsustained supraventricular and ventricular tachycardia Records and Results Reviewed   Past Medical History:  Diagnosis Date  . 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 Aortic stenosis   . Arthritis   . BPH (benign prostatic hyperplasia)   . CAD (coronary artery disease) 2004  . Dyspnea   . Heart attack ([place]) 2004  . Heart murmur   . Hypercholesterolemia   . 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 PVD (peripheral vascular disease) ([place])   . Wound infection 04/2017    Past Surgical History:  Procedure Laterality Date  . AORTIC VALVE REPLACEMENT N/A 04/22/2017   Procedure: AORTIC VALVE REPLACEMENT (AVR) using Magna Ease size 59mm;  Surgeon: Gaye Pollack, MD;  Location: [place] [facility];  Service: Open Heart Surgery;  Laterality: N/A;  .  APPLICATION OF WOUND VAC N/A 05/13/2017   Procedure: WOUND VAC CHANGE;  Surgeon: Melrose Nakayama, MD;  Location: [place];  Service: Thoracic;  Laterality: N/A;  . DENTAL SURGERY     removed  . hair follicle removed Left   . heart stent    . HERNIA REPAIR Bilateral 12/15/56   BIH and umbilical hernia repair  . INGUINAL HERNIA REPAIR Bilateral 01/18/2013   Procedure: LAPAROSCOPIC BILATERAL INGUINAL HERNIA REPAIR;  Surgeon: Odis Hollingshead, MD;  Location: [facility];  Service: General;  Laterality: Bilateral;  . INSERTION OF MESH Bilateral 01/18/2013   Procedure: INSERTION OF MESH;  Surgeon: Odis Hollingshead, MD;  Location: [facility];  Service: General;  Laterality: Bilateral;  AND UMBILICUS  . STERNAL WOUND DEBRIDEMENT N/A 05/11/2017   Procedure: STERNAL WOUND IRRIGATION AND DEBRIDEMENT;  Surgeon: Melrose Nakayama, MD;  Location: [place];  Service: Thoracic;  Laterality: N/A;  . TEE WITHOUT CARDIOVERSION N/A 04/22/2017   Procedure: TRANSESOPHAGEAL ECHOCARDIOGRAM (TEE);  Surgeon: Gaye Pollack, MD;  Location: [place];  Service: Open Heart Surgery;  Laterality: N/A;  . UMBILICAL HERNIA REPAIR N/A 01/18/2013   Procedure: HERNIA REPAIR UMBILICAL ADULT;  Surgeon: Odis Hollingshead, MD;  Location: [facility];  Service: General;  Laterality: N/A;    Current Outpatient Prescriptions  Medication Sig Dispense Refill  . nitroGLYCERIN (NITROSTAT) 0.4 MG SL tablet     . pravastatin (PRAVACHOL) 20 MG tablet Take 1 tablet by mouth daily.     No  current facility-administered medications for this visit.     Allergies  Allergen Reactions  . Bee Venom Shortness Of Breath and Swelling  . Crestor [Rosuvastatin] Other (See Comments)    MYALGIAS  . Diphenhydramine Hcl Other (See Comments)    Shaky legs  . Statins Other (See Comments)    MYALGIAS MUSCLE PAIN      Review of Systems negative except from HPI and PMH  Physical Exam BP (!) 150/68   Pulse 64   Ht 5\' 10"  (1.778 m)   Wt 154 lb (69.9 kg)   SpO2  98%   BMI 22.10 kg/m  Well developed and well nourished in no acute distress HENT normal E scleral and icterus clear Neck Supple JVP 7-8; carotids brisk and full Clear to ausculation  Regular rate and rhythm, 2/6 with split s2 Soft with active bowel sounds No clubbing cyanosis  Edema Alert and oriented, grossly normal motor and sensory function Skin Warm and Dry   ECG demonstrates sinus rhythm at 62 Intervals 16/09/41 LVH with repolarization abnormalities  Unchanged from before x absence of PVCs  Assessment and  Plan   transient visual disturbance with lightheadedness   AVR--surgical  Ventricular ectopy couplets and nonsustained VT   Fatigue   I am not sure of the mechanism of the transient visual disturbance, but in the context of lightheadedness would wonder first about some arrhythmia. We saw him today because I was not sure what his postoperative ECG which show in terms of conduction abnormality; intervals were all normal. Telemetry as noted above demonstrated nonsustained supraventricular and ventricular tachycardia. Hence, an event recorder may be helpful in elucidating this.  His fatigue is somewhat worrisome given the Serratia infection. I wonder if there is recurrent or persistent infection. Also will check it chronotropic competence/heart rate excursion with his monitor given the fatigue.  With his ectopy, will check his electrolytes today. We will check his CBC.  There is no history of post slight swelling to suggest PE and dyspnea was not a major complaint.  He does have a little bit of jugulovenous distention which surprises me a little bit. We'll check an echocardiogram.  We will follow up with Dr. Saundra Shelling as scheduled       Current medicines are reviewed at length with the patient today .  The patient does not  have concerns regarding medicines.

## 2017-08-19 NOTE — Patient Instructions (Signed)
Medication Instructions: - Your physician recommends that you continue on your current medications as directed. Please refer to the Current Medication list given to you today.  Labwork: - Your physician recommends that you have lab work today: BMP/ CBC  Procedures/Testing: - Your physician has requested that you have an echocardiogram. Echocardiography is a painless test that uses sound waves to create images of your heart. It provides your doctor with information about the size and shape of your heart and how well your heart's chambers and valves are working. This procedure takes approximately one hour. There are no restrictions for this procedure.  - Your physician has recommended that you wear an event monitor- for 30 days. Event monitors are medical devices that record the heart's electrical activity. Doctors most often [place] these monitors to diagnose arrhythmias. Arrhythmias are problems with the speed or rhythm of the heartbeat. The monitor is a small, portable device. You can wear one while you do your normal daily activities. This is usually used to diagnose what is causing palpitations/syncope (passing out).  Follow-Up: - Your physician recommends that you follow up as scheduled with Dr. Irish Lack.   Any Additional Special Instructions Will Be Listed Below (If Applicable).     If you need a refill on your cardiac medications before your next appointment, please call your pharmacy.

## 2017-08-19 NOTE — Progress Notes (Addendum)
Pt arrived at cardiac rehab today c/o episodes of dizziness associated with visual disturbances. Pt reports episodes occur daily at rest.  Pt also c/o fatigue, occasional palpitations and dyspnea on exertion. Pt denies pain.  Orthostatic Vital Signs:   BP:  137/71     HR:    71    Lying  BP:  145/85  HR:   75     Sitting  BP:  141/0   HR:   75     Standing   Pt asymptomatic at rehab  and felt agreeable to exercise. Pt tolerated light exercise without difficulty.    Dr. Caryl Comes (DOD) made aware of pt symptoms.  Dr. Caryl Comes recommended pt be seen today at 12:30.  pt instructed to arrive at [place] office at 12:15 for 12:30 appt.  Written and verbal instructions given. Pt verbalized understanding.

## 2017-08-20 LAB — CBC WITH DIFFERENTIAL/PLATELET
Basophils Absolute: 0 10*3/uL (ref 0.0–0.2)
Basos: 0 %
EOS (ABSOLUTE): 0.3 10*3/uL (ref 0.0–0.4)
EOS: 4 %
HEMATOCRIT: 40.8 % (ref 37.5–51.0)
HEMOGLOBIN: 13.8 g/dL (ref 13.0–17.7)
IMMATURE GRANS (ABS): 0 10*3/uL (ref 0.0–0.1)
Immature Granulocytes: 0 %
LYMPHS ABS: 1.7 10*3/uL (ref 0.7–3.1)
Lymphs: 24 %
MCH: 28.8 pg (ref 26.6–33.0)
MCHC: 33.8 g/dL (ref 31.5–35.7)
MCV: 85 fL (ref 79–97)
MONOCYTES: 8 %
Monocytes Absolute: 0.6 10*3/uL (ref 0.1–0.9)
Neutrophils Absolute: 4.6 10*3/uL (ref 1.4–7.0)
Neutrophils: 64 %
PLATELETS: 145 10*3/uL — AB (ref 150–379)
RBC: 4.79 x10E6/uL (ref 4.14–5.80)
RDW: 14.7 % (ref 12.3–15.4)
WBC: 7.2 10*3/uL (ref 3.4–10.8)

## 2017-08-20 LAB — BASIC METABOLIC PANEL
BUN / CREAT RATIO: 27 — AB (ref 10–24)
BUN: 18 mg/dL (ref 8–27)
CO2: 23 mmol/L (ref 20–29)
CREATININE: 0.66 mg/dL — AB (ref 0.76–1.27)
Calcium: 9 mg/dL (ref 8.6–10.2)
Chloride: 101 mmol/L (ref 96–106)
GFR calc non Af Amer: 95 mL/min/{1.73_m2} (ref >59)
GFR, EST AFRICAN AMERICAN: 110 mL/min/{1.73_m2} (ref >59)
Glucose: 76 mg/dL (ref 65–99)
Potassium: 4.4 mmol/L (ref 3.5–5.2)
Sodium: 142 mmol/L (ref 134–144)

## 2017-08-22 ENCOUNTER — Encounter (HOSPITAL_COMMUNITY)
Admission: RE | Admit: 2017-08-22 | Discharge: 2017-08-22 | Disposition: A | Discharge status: Home / Self Care | Payer: Medicare Other | Source: Ambulatory Visit | Attending: Interventional Cardiology | Admitting: Interventional Cardiology

## 2017-08-22 ENCOUNTER — Telehealth: Payer: Self-pay | Admitting: Interventional Cardiology

## 2017-08-22 ENCOUNTER — Encounter (HOSPITAL_COMMUNITY): Payer: Medicare Other

## 2017-08-22 ENCOUNTER — Telehealth: Payer: Self-pay

## 2017-08-22 DIAGNOSIS — Z952 Presence of prosthetic heart valve: Secondary | ICD-10-CM

## 2017-08-22 DIAGNOSIS — Z48812 Encounter for surgical aftercare following surgery on the circulatory system: Secondary | ICD-10-CM | POA: Diagnosis not present

## 2017-08-22 DIAGNOSIS — Z953 Presence of xenogenic heart valve: Secondary | ICD-10-CM | POA: Diagnosis not present

## 2017-08-22 DIAGNOSIS — Z23 Encounter for immunization: Secondary | ICD-10-CM | POA: Diagnosis not present

## 2017-08-22 MED ORDER — AMOXICILLIN 500 MG PO CAPS: ORAL_CAPSULE | ORAL | 3 refills | Status: DC

## 2017-08-22 NOTE — Telephone Encounter (Signed)
New Message  1. What dental office are you calling from? Dr. [name]  2. What is your office phone and fax number? [phone] fax [phone]  What type of procedure is the patient having performed?  Dental cleaning   3. What date is procedure scheduled or is the patient there now? No date   4. What is your question (ex. Antibiotics prior to procedure, holding medication-we need to know how long dentist wants pt to hold med)?

## 2017-08-22 NOTE — Telephone Encounter (Signed)
Pt is aware and agreeable to results 

## 2017-08-22 NOTE — Telephone Encounter (Signed)
Patient post AV replacement June 2018.  Will need dental prophylaxis for upcoming visit.    Spoke with both dental office and patient.  Patient is familiar with this as he has been doing dental prophylaxis prior to the replacement because of stenosis.    Will send rx to [facility] per patient request

## 2017-08-22 NOTE — Telephone Encounter (Signed)
lmtcb for lab results

## 2017-08-22 NOTE — Telephone Encounter (Signed)
New Message     [facility] Pre-operative Risk Assessment    Request for surgical clearance:  1. What type of surgery is being performed? Dental Cleaning   2. When is this surgery scheduled? Scheduled for today , but need to be reschedule   3. Are there any medications that need to be held prior to surgery and how long? TBD  4. Name of physician performing surgery? [name]   5. What is your office phone and fax number? [phone] fax [phone]  6. Anesthesia type (None, local, MAC, general) ?    Darin Simmons 08/22/2017, 9:55 AM  _________________________________________________________________   (provider comments below)

## 2017-08-24 ENCOUNTER — Encounter (HOSPITAL_COMMUNITY)
Admission: RE | Admit: 2017-08-24 | Discharge: 2017-08-24 | Disposition: A | Discharge status: Home / Self Care | Payer: Medicare Other | Source: Ambulatory Visit | Attending: Interventional Cardiology | Admitting: Interventional Cardiology

## 2017-08-24 ENCOUNTER — Encounter (HOSPITAL_COMMUNITY): Payer: Medicare Other

## 2017-08-24 DIAGNOSIS — Z48812 Encounter for surgical aftercare following surgery on the circulatory system: Secondary | ICD-10-CM | POA: Diagnosis not present

## 2017-08-24 DIAGNOSIS — Z952 Presence of prosthetic heart valve: Secondary | ICD-10-CM

## 2017-08-24 DIAGNOSIS — Z953 Presence of xenogenic heart valve: Secondary | ICD-10-CM | POA: Diagnosis not present

## 2017-08-25 ENCOUNTER — Encounter: Payer: Self-pay | Admitting: *Deleted

## 2017-08-25 NOTE — Progress Notes (Signed)
Patient ID: Darin Simmons, male   DOB: 08-16-1941, 76 y.o.   MRN: [id number]  Patient enrolled for [employer] to mail a MCT-3 cardiac event monitor to his home.

## 2017-08-26 ENCOUNTER — Encounter (HOSPITAL_COMMUNITY)
Admission: RE | Admit: 2017-08-26 | Discharge: 2017-08-26 | Disposition: A | Discharge status: Home / Self Care | Payer: Medicare Other | Source: Ambulatory Visit | Attending: Interventional Cardiology | Admitting: Interventional Cardiology

## 2017-08-26 ENCOUNTER — Encounter (HOSPITAL_COMMUNITY): Payer: Medicare Other

## 2017-08-26 DIAGNOSIS — Z48812 Encounter for surgical aftercare following surgery on the circulatory system: Secondary | ICD-10-CM | POA: Diagnosis not present

## 2017-08-26 DIAGNOSIS — Z952 Presence of prosthetic heart valve: Secondary | ICD-10-CM

## 2017-08-26 DIAGNOSIS — Z953 Presence of xenogenic heart valve: Secondary | ICD-10-CM | POA: Diagnosis not present

## 2017-08-29 ENCOUNTER — Encounter (HOSPITAL_COMMUNITY): Payer: Medicare Other

## 2017-08-29 ENCOUNTER — Encounter (HOSPITAL_COMMUNITY)
Admission: RE | Admit: 2017-08-29 | Discharge: 2017-08-29 | Disposition: A | Discharge status: Home / Self Care | Payer: Medicare Other | Source: Ambulatory Visit | Attending: Interventional Cardiology | Admitting: Interventional Cardiology

## 2017-08-29 DIAGNOSIS — Z48812 Encounter for surgical aftercare following surgery on the circulatory system: Secondary | ICD-10-CM | POA: Diagnosis not present

## 2017-08-29 DIAGNOSIS — Z953 Presence of xenogenic heart valve: Secondary | ICD-10-CM | POA: Diagnosis not present

## 2017-08-29 DIAGNOSIS — Z952 Presence of prosthetic heart valve: Secondary | ICD-10-CM

## 2017-08-30 ENCOUNTER — Encounter (HOSPITAL_COMMUNITY): Payer: Self-pay

## 2017-08-30 NOTE — Progress Notes (Signed)
Cardiac Individual Treatment Plan  Patient Details  Name: Darin Simmons MRN: [id number] Date of Birth: 1941/07/21 Referring Provider:     CARDIAC REHAB PHASE II ORIENTATION from 06/28/2017 in [place]  Referring Provider  Larae Grooms, MD.      Initial Encounter Date:    CARDIAC REHAB PHASE II ORIENTATION from 06/28/2017 in [place]  Date  06/28/17  Referring Provider  Larae Grooms, MD.      Visit Diagnosis: S/P AVR  Patient's Home Medications on Admission:  Current Outpatient Prescriptions:  .  amoxicillin (AMOXIL) 500 MG capsule, Take 4 capsules by mouth 1 hour prior to dental appointment, Disp: 12 capsule, Rfl: 3 .  nitroGLYCERIN (NITROSTAT) 0.4 MG SL tablet, , Disp: , Rfl:  .  pravastatin (PRAVACHOL) 20 MG tablet, Take 1 tablet by mouth daily., Disp: , Rfl:   Past Medical History: Past Medical History:  Diagnosis Date  . 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 Aortic stenosis   . Arthritis   . BPH (benign prostatic hyperplasia)   . CAD (coronary artery disease) 2004  . Dyspnea   . Heart attack ([place]) 2004  . Heart murmur   . Hypercholesterolemia   . 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 PVD (peripheral vascular disease) ([place])   . Wound infection 04/2017    Tobacco Use: History  Smoking Status  . Former Smoker  . Packs/day: 1.00  . Years: 50.00  . Types: Cigarettes  . Quit date: 11/22/2008  Smokeless Tobacco  . Never Used    Labs: Recent Review Flowsheet Data    Labs for ITP Cardiac and Pulmonary Rehab Latest Ref Rng & Units 04/22/2017 04/22/2017 04/22/2017 04/23/2017 05/05/2017   Cholestrol 100 - 199 mg/dL - - - - -   LDLCALC 0 - 99 mg/dL - - - - -   HDL >39 mg/dL - - - - -   Trlycerides 0 - 149 mg/dL - - - - -   Hemoglobin A1c 4.8 - 5.6 % - - - - 5.1   PHART 7.350 - 7.450 7.480(H) 7.352 - - -   PCO2ART 32.0 - 48.0 mmHg 29.5(L) 38.7 - - -   HCO3 20.0 - 28.0 mmol/L 21.9 21.3 - - -    TCO2 0 - 100 mmol/L '23 22 23 26 ' -   ACIDBASEDEF 0.0 - 2.0 mmol/L 1.0 4.0(H) - - -   O2SAT % 99.0 98.0 - - -      Capillary Blood Glucose: Lab Results  Component Value Date   GLUCAP 84 04/24/2017   GLUCAP 116 (H) 04/24/2017   GLUCAP 82 04/23/2017   GLUCAP 147 (H) 04/23/2017   GLUCAP 120 (H) 04/23/2017     Exercise Target Goals:    Exercise Program Goal: Individual exercise prescription set with THRR, safety & activity barriers. Participant demonstrates ability to understand and report RPE using BORG scale, to self-measure pulse accurately, and to acknowledge the importance of the exercise prescription.  Exercise Prescription Goal: Starting with aerobic activity 30 plus minutes a day, 3 days per week for initial exercise prescription. Provide home exercise prescription and guidelines that participant acknowledges understanding prior to discharge.  Activity Barriers & Risk Stratification:     Activity Barriers & Cardiac Risk Stratification - 06/28/17 1021      Activity Barriers & Cardiac Risk Stratification   Activity Barriers None   Cardiac Risk Stratification Moderate      6  Minute Walk:     6 Minute Walk    Row Name 06/28/17 1018         6 Minute Walk   Phase Initial     Distance 2016 feet     Walk Time 6 minutes     # of Rest Breaks 0     MPH 3.82     METS 4.61     RPE 12     VO2 Peak 16.13     Symptoms No     Resting HR 76 bpm     Resting BP 112/64     Max Ex. HR 104 bpm     Max Ex. BP 150/70     2 Minute Post BP 118/66        Oxygen Initial Assessment:   Oxygen Re-Evaluation:   Oxygen Discharge (Final Oxygen Re-Evaluation):   Initial Exercise Prescription:     Initial Exercise Prescription - 06/28/17 1000      Date of Initial Exercise RX and Referring Provider   Date 06/28/17   Referring Provider Larae Grooms, MD.     Bike   Level 1.2   Minutes 10   METs 4.48     NuStep   Level 4   SPM 85   Minutes 10   METs 3      Track   Laps 13   Minutes 10   METs 3.26     Prescription Details   Frequency (times per week) 3   Duration Progress to 30 minutes of continuous aerobic without signs/symptoms of physical distress     Intensity   THRR 40-80% of Max Heartrate 58-117   Ratings of Perceived Exertion 11-13   Perceived Dyspnea 0-4     Progression   Progression Continue to progress workloads to maintain intensity without signs/symptoms of physical distress.     Resistance Training   Training Prescription Yes   Weight 3lbs.   Reps 10-15      Perform Capillary Blood Glucose checks as needed.  Exercise Prescription Changes:     Exercise Prescription Changes    Row Name 07/05/17 1100 07/22/17 1516 08/01/17 1600 08/19/17 1619 08/22/17 1600     Response to Exercise   Blood Pressure (Admit) 132/74 114/70 126/62 134/70 130/80   Blood Pressure (Exercise) 144/82 121/62 152/70 120/80 140/80   Blood Pressure (Exit) 104/62 112/60 108/62 120/80 118/62   Heart Rate (Admit) 72 bpm 71 bpm 75 bpm 76 bpm 66 bpm   Heart Rate (Exercise) 117 bpm 99 bpm 94 bpm 100 bpm 101 bpm   Heart Rate (Exit) 77 bpm 70 bpm 74 bpm 76 bpm 66 bpm   Rating of Perceived Exertion (Exercise) '12 15 14 12 13   ' Symptoms none none mild dizziness this morning when bending over to tie shoes  -  -   Comments Pt was oriented to exercise equipment on 07/04/17  - pt did not feel like himself. Pt did not work as hard in cardiac rehab today  -  -   Duration Continue with 30 min of aerobic exercise without signs/symptoms of physical distress. Continue with 30 min of aerobic exercise without signs/symptoms of physical distress. Continue with 30 min of aerobic exercise without signs/symptoms of physical distress. Continue with 30 min of aerobic exercise without signs/symptoms of physical distress. Continue with 30 min of aerobic exercise without signs/symptoms of physical distress.   Intensity THRR unchanged THRR unchanged THRR unchanged THRR unchanged  THRR unchanged  Progression   Progression Continue to progress workloads to maintain intensity without signs/symptoms of physical distress. Continue to progress workloads to maintain intensity without signs/symptoms of physical distress. Continue to progress workloads to maintain intensity without signs/symptoms of physical distress. Continue to progress workloads to maintain intensity without signs/symptoms of physical distress. Continue to progress workloads to maintain intensity without signs/symptoms of physical distress.   Average METs 3.6 4 3.5 4 3.9     Resistance Training   Training Prescription Yes Yes Yes Yes Yes   Weight 3lbs. 5lbs 4lbs 5lbs 5lbs   Reps 10-15 10-15 10-15 10-15 10-15   Time 10 Minutes 10 Minutes 10 Minutes 10 Minutes 10 Minutes     Bike   Level 1.2 1.3 1.3 1.3 1.3   Minutes '10 10 10 10 10   ' METs 4.48 4.65 4.65 4.63 4.51     NuStep   Level '4 4 4 4 4   ' SPM 80 80 70 70 70   Minutes 10 90 90 10 90   METs 2.7 3.6 2.7 3.6 3.9     Track   Laps '15 16 13 13 13   ' Minutes '10 10 10 10 10   ' METs 3.6 3.79 3.26 3.26 3.26     Home Exercise Plan   Plans to continue exercise at  - Home (comment) Home (comment) Home (comment) Home (comment)   Frequency  - Add 3 additional days to program exercise sessions. Add 3 additional days to program exercise sessions. Add 3 additional days to program exercise sessions. Add 3 additional days to program exercise sessions.   Initial Home Exercises Provided  - 07/13/17 07/13/17 07/13/17 07/13/17      Exercise Comments:     Exercise Comments    Row Name 07/04/17 1145 08/02/17 0820 08/30/17 1237       Exercise Comments Pt was oriented to exercise equipment today. Pt responded well to exercise session and denied signs/symptoms of CP, dizziness or unusual SOB. Will continue to monitor pt's actvity levels. Reviewed METs and goals. Pt is tolerating exercises and WL increases very well; will continue to monitor pt's progress and  actvity levels. Reviewed METs and goals. Pt is tolerating exercises and WL increases very well; will continue to monitor pt's progress and actvity levels.        Exercise Goals and Review:     Exercise Goals    Row Name 06/28/17 1024             Exercise Goals   Increase Physical Activity Yes       Intervention Provide advice, education, support and counseling about physical activity/exercise needs.;Develop an individualized exercise prescription for aerobic and resistive training based on initial evaluation findings, risk stratification, comorbidities and participant's personal goals.       Expected Outcomes Achievement of increased cardiorespiratory fitness and enhanced flexibility, muscular endurance and strength shown through measurements of functional capacity and personal statement of participant.       Increase Strength and Stamina Yes       Intervention Provide advice, education, support and counseling about physical activity/exercise needs.;Develop an individualized exercise prescription for aerobic and resistive training based on initial evaluation findings, risk stratification, comorbidities and participant's personal goals.       Expected Outcomes Achievement of increased cardiorespiratory fitness and enhanced flexibility, muscular endurance and strength shown through measurements of functional capacity and personal statement of participant.          Exercise Goals Re-Evaluation :  Exercise Goals Re-Evaluation    Row Name 07/13/17 1044 08/02/17 0821 08/30/17 1237         Exercise Goal Re-Evaluation   Exercise Goals Review Increase Strenth and Stamina;Increase Physical Activity Increase Physical Activity;Able to understand and use rate of perceived exertion (RPE) scale;Knowledge and understanding of Target Heart Rate Range (THRR);Understanding of Exercise Prescription;Increase Strength and Stamina;Able to check pulse independently Increase Physical Activity;Able to  understand and use rate of perceived exertion (RPE) scale;Knowledge and understanding of Target Heart Rate Range (THRR);Understanding of Exercise Prescription;Increase Strength and Stamina;Able to check pulse independently     Comments Reviewed home exercise with pt today.  Pt plans to walk for exercise, 3x/week in addition to coming to cardiac rehab.  Reviewed THR, pulse, RPE, sign and symptoms, NTG use, and when to call 911 or MD.  Also discussed weather considerations and indoor options.  Pt voiced understanding. Pt is actively involved with HEP and is walking at home 3x/week in addition to coming to cardiac rehab. pt stated, " having increase understanding exercise/activuty limitations." Pt also stated that UE strength is increasing and seeing more muscle definition throughout. pt recentlly returned from trrip to [place] (home) , visiting mother, in which we did lots of walking. Upon return pt felt more fatigue/tired and think it is related to valve. MD notified. Pt is active with yardwork an d household chores.     Expected Outcomes Pt will be compliant with HEP and improve in cardiorespiratory fitness. Pt will be compliant with HEP and improve in cardiorespiratory fitness and overall strength. Pt will continue to improve in cardiorespiratory fitness and will f/u with cardiology team for echocardiogram on October 10th          Discharge Exercise Prescription (Final Exercise Prescription Changes):     Exercise Prescription Changes - 08/22/17 1600      Response to Exercise   Blood Pressure (Admit) 130/80   Blood Pressure (Exercise) 140/80   Blood Pressure (Exit) 118/62   Heart Rate (Admit) 66 bpm   Heart Rate (Exercise) 101 bpm   Heart Rate (Exit) 66 bpm   Rating of Perceived Exertion (Exercise) 13   Duration Continue with 30 min of aerobic exercise without signs/symptoms of physical distress.   Intensity THRR unchanged     Progression   Progression Continue to progress workloads to  maintain intensity without signs/symptoms of physical distress.   Average METs 3.9     Resistance Training   Training Prescription Yes   Weight 5lbs   Reps 10-15   Time 10 Minutes     Bike   Level 1.3   Minutes 10   METs 4.51     NuStep   Level 4   SPM 70   Minutes 90   METs 3.9     Track   Laps 13   Minutes 10   METs 3.26     Home Exercise Plan   Plans to continue exercise at Home (comment)   Frequency Add 3 additional days to program exercise sessions.   Initial Home Exercises Provided 07/13/17      Nutrition:  Target Goals: Understanding of nutrition guidelines, daily intake of sodium <1566m, cholesterol <2012m calories 30% from fat and 7% or less from saturated fats, daily to have 5 or more servings of fruits and vegetables.  Biometrics:     Pre Biometrics - 06/28/17 1022      Pre Biometrics   Height '5\' 10"'  (1.778 m)   Weight 142 lb 10.2  oz (64.7 kg)   Waist Circumference 31.5 inches   Hip Circumference 36 inches   Waist to Hip Ratio 0.88 %   BMI (Calculated) 20.5   Triceps Skinfold 12 mm   % Body Fat 20.1 %   Grip Strength 33.5 kg   Flexibility 9 in   Single Leg Stand 21 seconds       Nutrition Therapy Plan and Nutrition Goals:     Nutrition Therapy & Goals - 06/28/17 1412      Nutrition Therapy   Diet Therapeutic Lifestyle Changes     Personal Nutrition Goals   Nutrition Goal Pt to describe the benefit of including fruits, vegetables, whole grains, and low-fat dairy products in a heart healthy meal plan.     Intervention Plan   Intervention Prescribe, educate and counsel regarding individualized specific dietary modifications aiming towards targeted core components such as weight, hypertension, lipid management, diabetes, heart failure and other comorbidities.   Expected Outcomes Short Term Goal: Understand basic principles of dietary content, such as calories, fat, sodium, cholesterol and nutrients.;Long Term Goal: Adherence to prescribed  nutrition plan.      Nutrition Discharge: Nutrition Scores:     Nutrition Assessments - 06/28/17 1413      MEDFICTS Scores   Pre Score 68      Nutrition Goals Re-Evaluation:   Nutrition Goals Re-Evaluation:   Nutrition Goals Discharge (Final Nutrition Goals Re-Evaluation):   Psychosocial: Target Goals: Acknowledge presence or absence of significant depression and/or stress, maximize coping skills, provide positive support system. Participant is able to verbalize types and ability to use techniques and skills needed for reducing stress and depression.  Initial Review & Psychosocial Screening:     Initial Psych Review & Screening - 06/28/17 1154      Initial Review   Current issues with Current Stress Concerns   Source of Stress Concerns Chronic Illness   Comments upon bried assessment, pt exhibits health related anxiety      Family Dynamics   Good Support System? Yes  pt identifies himself as support, spouse and friends as positive influence      Barriers   Psychosocial barriers to participate in program There are no identifiable barriers or psychosocial needs.     Screening Interventions   Interventions Encouraged to exercise;Provide feedback about the scores to participant      Quality of Life Scores:     Quality of Life - 07/15/17 1655      Quality of Life Scores   Family Pre --  pt is eager and anxious for trip to [place] next month to visit his mother. He is fearful the travel will be very taxing on his this year. pt instructed to contact airline for assistance. pt offered emotional support and reassurance.    GLOBAL Pre --  overall QOL scores good. pt has health related, aging related and generalized anxieties which he verbalizes good coping skills. pt encouraged to particpate in meditation/mindfulness class and stress mgmt class.       PHQ-9: Recent Review Flowsheet Data    Depression screen [facility] 2/9 07/04/2017 06/21/2017   Decreased Interest 0 0    Down, Depressed, Hopeless 1 1   PHQ - 2 Score 1 1     Interpretation of Total Score  Total Score Depression Severity:  1-4 = Minimal depression, 5-9 = Mild depression, 10-14 = Moderate depression, 15-19 = Moderately severe depression, 20-27 = Severe depression   Psychosocial Evaluation and Intervention:     Psychosocial  Evaluation - 07/06/17 1741      Psychosocial Evaluation & Interventions   Interventions Encouraged to exercise with the program and follow exercise prescription;Stress management education;Relaxation education   Comments pt exhibits mild health related stress/anxiety.  This is decreasing with CR activities.  otherwise no apparent psychosocial needs identified, no interventions necessary    Expected Outcomes pt will exhibit positive outlook with good coping skills.    Continue Psychosocial Services  No Follow up required      Psychosocial Re-Evaluation:     Psychosocial Re-Evaluation    [place] Name 07/06/17 1743 07/29/17 1647 08/24/17 1027         Psychosocial Re-Evaluation   Current issues with Current Anxiety/Panic;Current Stress Concerns Current Anxiety/Panic;Current Stress Concerns Current Anxiety/Panic;Current Stress Concerns     Comments Health related stress/anxiety improving with CR activities.  otherwise no psychosocial needs identified, no interventions necessary. Health related stress/anxiety improving with CR activities. pt demonstrates decreased stress/anxiety with exercise and CR participation. pt enthusiastically interacts with his peers. pt verbalizes decreased anxiety about planned [place] trip next week.  otherwise no psychosocial needs identified, no interventions necessary. Health related stress/anxiety improving with CR activities. pt demonstrates decreased stress/anxiety with exercise and CR participation. pt enthusiastically interacts with his peers. pt tolerated [place] trip without significant anxiety.  no psychosocial needs identified, no  interventions necessary.     Expected Outcomes pt will exhibit positive outlook with good coping skills.  pt will exhibit positive outlook with good coping skills.  pt will exhibit positive outlook with good coping skills.      Interventions Stress management education;Relaxation education;Encouraged to attend Cardiac Rehabilitation for the exercise Stress management education;Relaxation education;Encouraged to attend Cardiac Rehabilitation for the exercise Stress management education;Relaxation education;Encouraged to attend Cardiac Rehabilitation for the exercise     Continue Psychosocial Services  No Follow up required No Follow up required No Follow up required     Comments  -  - upon bried assessment, pt exhibits health related anxiety        Initial Review   Source of Stress Concerns  -  - Chronic Illness        Psychosocial Discharge (Final Psychosocial Re-Evaluation):     Psychosocial Re-Evaluation - 08/24/17 1027      Psychosocial Re-Evaluation   Current issues with Current Anxiety/Panic;Current Stress Concerns   Comments Health related stress/anxiety improving with CR activities. pt demonstrates decreased stress/anxiety with exercise and CR participation. pt enthusiastically interacts with his peers. pt tolerated [place] trip without significant anxiety.  no psychosocial needs identified, no interventions necessary.   Expected Outcomes pt will exhibit positive outlook with good coping skills.    Interventions Stress management education;Relaxation education;Encouraged to attend Cardiac Rehabilitation for the exercise   Continue Psychosocial Services  No Follow up required   Comments upon bried assessment, pt exhibits health related anxiety      Initial Review   Source of Stress Concerns Chronic Illness      Vocational Rehabilitation: Provide vocational rehab assistance to qualifying candidates.   Vocational Rehab Evaluation & Intervention:   Education: Education Goals:  Education classes will be provided on a weekly basis, covering required topics. Participant will state understanding/return demonstration of topics presented.  Learning Barriers/Preferences:     Learning Barriers/Preferences - 06/28/17 1025      Learning Barriers/Preferences   Learning Barriers Sight;Hearing   Learning Preferences Written Material      Education Topics: Count Your Pulse:  -Group instruction provided by verbal instruction, demonstration, patient  participation and written materials to support subject.  Instructors address importance of being able to find your pulse and how to count your pulse when at home without a heart monitor.  Patients get hands on experience counting their pulse with staff help and individually.   CARDIAC REHAB PHASE II EXERCISE from 08/26/2017 in [place]  Date  07/29/17  Instruction Review Code  2- meets goals/outcomes      Heart Attack, Angina, and Risk Factor Modification:  -Group instruction provided by verbal instruction, video, and written materials to support subject.  Instructors address signs and symptoms of angina and heart attacks.    Also discuss risk factors for heart disease and how to make changes to improve heart health risk factors.   Functional Fitness:  -Group instruction provided by verbal instruction, demonstration, patient participation, and written materials to support subject.  Instructors address safety measures for doing things around the house.  Discuss how to get up and down off the floor, how to pick things up properly, how to safely get out of a chair without assistance, and balance training.   CARDIAC REHAB PHASE II EXERCISE from 08/26/2017 in [place]  Date  07/08/17  Instruction Review Code  2- meets goals/outcomes      Meditation and Mindfulness:  -Group instruction provided by verbal instruction, patient participation, and written materials to  support subject.  Instructor addresses importance of mindfulness and meditation practice to help reduce stress and improve awareness.  Instructor also leads participants through a meditation exercise.    CARDIAC REHAB PHASE II EXERCISE from 08/26/2017 in [place]  Date  08/24/17  Instruction Review Code  2- meets goals/outcomes      Stretching for Flexibility and Mobility:  -Group instruction provided by verbal instruction, patient participation, and written materials to support subject.  Instructors lead participants through series of stretches that are designed to increase flexibility thus improving mobility.  These stretches are additional exercise for major muscle groups that are typically performed during regular warm up and cool down.   CARDIAC REHAB PHASE II EXERCISE from 08/26/2017 in [place]  Date  08/26/17  Instruction Review Code  2- meets goals/outcomes      Hands Only CPR:  -Group verbal, video, and participation provides a basic overview of AHA guidelines for community CPR. Role-play of emergencies allow participants the opportunity to practice calling for help and chest compression technique with discussion of AED use.   Hypertension: -Group verbal and written instruction that provides a basic overview of hypertension including the most recent diagnostic guidelines, risk factor reduction with self-care instructions and medication management.    Nutrition I class: Heart Healthy Eating:  -Group instruction provided by PowerPoint slides, verbal discussion, and written materials to support subject matter. The instructor gives an explanation and review of the Therapeutic Lifestyle Changes diet recommendations, which includes a discussion on lipid goals, dietary fat, sodium, fiber, plant stanol/sterol esters, sugar, and the components of a well-balanced, healthy diet.   Nutrition II class: Lifestyle Skills:   -Group instruction provided by PowerPoint slides, verbal discussion, and written materials to support subject matter. The instructor gives an explanation and review of label reading, grocery shopping for heart health, heart healthy recipe modifications, and ways to make healthier choices when eating out.   Diabetes Question & Answer:  -Group instruction provided by PowerPoint slides, verbal discussion, and written materials to support subject matter.  The instructor gives an explanation and review of diabetes co-morbidities, pre- and post-prandial blood glucose goals, pre-exercise blood glucose goals, signs, symptoms, and treatment of hypoglycemia and hyperglycemia, and foot care basics.   Diabetes Blitz:  -Group instruction provided by PowerPoint slides, verbal discussion, and written materials to support subject matter. The instructor gives an explanation and review of the physiology behind type 1 and type 2 diabetes, diabetes medications and rational behind using different medications, pre- and post-prandial blood glucose recommendations and Hemoglobin A1c goals, diabetes diet, and exercise including blood glucose guidelines for exercising safely.    Portion Distortion:  -Group instruction provided by PowerPoint slides, verbal discussion, written materials, and food models to support subject matter. The instructor gives an explanation of serving size versus portion size, changes in portions sizes over the last 20 years, and what consists of a serving from each food group.   CARDIAC REHAB PHASE II EXERCISE from 08/26/2017 in [facility]  Date  07/13/17  Educator  RD  Instruction Review Code  2- meets goals/outcomes      Stress Management:  -Group instruction provided by verbal instruction, video, and written materials to support subject matter.  Instructors review role of stress in heart disease and how to cope with stress positively.     CARDIAC REHAB PHASE II  EXERCISE from 08/26/2017 in [facility]  Date  07/20/17  Instruction Review Code  2- meets goals/outcomes      Exercising on Your Own:  -Group instruction provided by verbal instruction, power point, and written materials to support subject.  Instructors discuss benefits of exercise, components of exercise, frequency and intensity of exercise, and end points for exercise.  Also discuss use of nitroglycerin and activating EMS.  Review options of places to exercise outside of rehab.  Review guidelines for sex with heart disease.   CARDIAC REHAB PHASE II EXERCISE from 08/26/2017 in [facility] [facility]  Date  07/27/17  Instruction Review Code  2- meets goals/outcomes      Cardiac Drugs I:  -Group instruction provided by verbal instruction and written materials to support subject.  Instructor reviews cardiac drug classes: antiplatelets, anticoagulants, beta blockers, and statins.  Instructor discusses reasons, side effects, and lifestyle considerations for each drug class.   Cardiac Drugs II:  -Group instruction provided by verbal instruction and written materials to support subject.  Instructor reviews cardiac drug classes: angiotensin converting enzyme inhibitors (ACE-I), angiotensin II receptor blockers (ARBs), nitrates, and calcium channel blockers.  Instructor discusses reasons, side effects, and lifestyle considerations for each drug class.   Anatomy and Physiology of the Circulatory System:  Group verbal and written instruction and models provide basic cardiac anatomy and physiology, with the coronary electrical and arterial systems. Review of: AMI, Angina, Valve disease, Heart Failure, Peripheral Artery Disease, Cardiac Arrhythmia, Pacemakers, and the ICD.   Other Education:  -Group or individual verbal, written, or video instructions that support the educational goals of the cardiac rehab program.   Knowledge Questionnaire Score:      Knowledge Questionnaire Score - 06/28/17 1032      Knowledge Questionnaire Score   Pre Score 22/24      Core Components/Risk Factors/Patient Goals at Admission:     Personal Goals and Risk Factors at Admission - 06/28/17 1059      Core Components/Risk Factors/Patient Goals on Admission   Lipids Yes   Intervention Provide education and support for participant on nutrition & aerobic/resistive  exercise along with prescribed medications to achieve LDL <22m, HDL >477m   Expected Outcomes Long Term: Cholesterol controlled with medications as prescribed, with individualized exercise RX and with personalized nutrition plan. Value goals: LDL < 7029mHDL > 40 mg.;Short Term: Participant states understanding of desired cholesterol values and is compliant with medications prescribed. Participant is following exercise prescription and nutrition guidelines.   Stress Yes   Intervention Offer individual and/or small group education and counseling on adjustment to heart disease, stress management and health-related lifestyle change. Teach and support self-help strategies.;Refer participants experiencing significant psychosocial distress to appropriate mental health specialists for further evaluation and treatment. When possible, include family members and significant others in education/counseling sessions.   Expected Outcomes Long Term: Emotional wellbeing is indicated by absence of clinically significant psychosocial distress or social isolation.;Short Term: Participant demonstrates changes in health-related behavior, relaxation and other stress management skills, ability to obtain effective social support, and compliance with psychotropic medications if prescribed.   Personal Goal Other Yes   Personal Goal Have energy to travel. Have energy to organize house and work in garden.   Intervention Provide aerobic exercise prgram to help increase strength and stamina, increase in energy expenditure as  evidenced by increasing MET level, and increase functional capacity as evidenced by 6-minute walk test.   Expected Outcomes Patient will have sufficient energy to travel and take trip to [place] September 2018. Patient will have sufficient energy to do household chores including organizing household items and garden.      Core Components/Risk Factors/Patient Goals Review:      Goals and Risk Factor Review    Row Name 07/06/17 1737 07/29/17 1646 08/24/17 1026         Core Components/Risk Factors/Patient Goals Review   Personal Goals Review Stress;Lipids;Other Stress;Lipids;Other Stress;Lipids;Other     Review pt with few CAD risk factors.  pt is looking forward to increased strength/stamina and energy to be able to visit [place] a few months. pt seems eager to participate in CR activities pt with few CAD risk factors.  pt is looking forward to increased strength/stamina and energy to be able to [place]. pt has made appropriate travel arrangements and is looking forward to his trip.  pt seems eager to participate in CR activities and enthusiastically interacts with his peers.    pt with few CAD risk factors.  pt tolerated [place] well with exception of experiencing dizziness.  Symptoms being evaluated by cardiology.  pt seems eager to participate in CR activities and enthusiastically interacts with his peers.       Expected Outcomes pt will participate in CR exercise, nutrition and education opportunities to decrease overall RF.   pt will participate in CR exercise, nutrition and education opportunities to decrease overall RF.   pt will participate in CR exercise, nutrition and education opportunities to decrease overall RF.          Core Components/Risk Factors/Patient Goals at Discharge (Final Review):      Goals and Risk Factor Review - 08/24/17 1026      Core Components/Risk Factors/Patient Goals Review   Personal Goals Review Stress;Lipids;Other   Review pt  with few CAD risk factors.  pt tolerated [place] well with exception of experiencing dizziness.  Symptoms being evaluated by cardiology.  pt seems eager to participate in CR activities and enthusiastically interacts with his peers.     Expected Outcomes pt will participate in CR exercise, nutrition and education opportunities to  decrease overall RF.        ITP Comments:     ITP Comments    Row Name 06/28/17 3254 08/03/17 1616 08/30/17 1548       ITP Comments Dr. Fransico Him, Medical Director  Dr. Fransico Him, Medical Director  30 day ITP review.  Pt with good participation and attendance.  No change in current regimen unless directed by Medical Director.          Comments:

## 2017-08-31 ENCOUNTER — Encounter (HOSPITAL_COMMUNITY)
Admission: RE | Admit: 2017-08-31 | Discharge: 2017-08-31 | Disposition: A | Discharge status: Home / Self Care | Payer: Medicare Other | Source: Ambulatory Visit | Attending: Interventional Cardiology | Admitting: Interventional Cardiology

## 2017-08-31 ENCOUNTER — Encounter (HOSPITAL_COMMUNITY): Payer: Medicare Other

## 2017-08-31 DIAGNOSIS — Z953 Presence of xenogenic heart valve: Secondary | ICD-10-CM | POA: Diagnosis not present

## 2017-08-31 DIAGNOSIS — Z48812 Encounter for surgical aftercare following surgery on the circulatory system: Secondary | ICD-10-CM | POA: Diagnosis not present

## 2017-08-31 DIAGNOSIS — Z952 Presence of prosthetic heart valve: Secondary | ICD-10-CM

## 2017-09-01 ENCOUNTER — Encounter (INDEPENDENT_AMBULATORY_CARE_PROVIDER_SITE_OTHER): Payer: Medicare Other

## 2017-09-01 DIAGNOSIS — I472 Ventricular tachycardia, unspecified: Secondary | ICD-10-CM

## 2017-09-01 DIAGNOSIS — R42 Dizziness and giddiness: Secondary | ICD-10-CM | POA: Diagnosis not present

## 2017-09-02 ENCOUNTER — Encounter (HOSPITAL_COMMUNITY)
Admission: RE | Admit: 2017-09-02 | Discharge: 2017-09-02 | Disposition: A | Discharge status: Home / Self Care | Payer: Medicare Other | Source: Ambulatory Visit | Attending: Interventional Cardiology | Admitting: Interventional Cardiology

## 2017-09-02 ENCOUNTER — Encounter (HOSPITAL_COMMUNITY): Payer: Medicare Other

## 2017-09-02 DIAGNOSIS — Z48812 Encounter for surgical aftercare following surgery on the circulatory system: Secondary | ICD-10-CM | POA: Diagnosis not present

## 2017-09-02 DIAGNOSIS — Z953 Presence of xenogenic heart valve: Secondary | ICD-10-CM | POA: Diagnosis not present

## 2017-09-02 DIAGNOSIS — Z952 Presence of prosthetic heart valve: Secondary | ICD-10-CM

## 2017-09-05 ENCOUNTER — Ambulatory Visit (HOSPITAL_COMMUNITY): Payer: Medicare Other | Attending: Cardiovascular Disease

## 2017-09-05 ENCOUNTER — Other Ambulatory Visit: Payer: Self-pay

## 2017-09-05 ENCOUNTER — Encounter (HOSPITAL_COMMUNITY)
Admission: RE | Admit: 2017-09-05 | Discharge: 2017-09-05 | Disposition: A | Discharge status: Home / Self Care | Payer: Medicare Other | Source: Ambulatory Visit | Attending: Interventional Cardiology | Admitting: Interventional Cardiology

## 2017-09-05 ENCOUNTER — Encounter (HOSPITAL_COMMUNITY): Payer: Medicare Other

## 2017-09-05 DIAGNOSIS — Z952 Presence of prosthetic heart valve: Secondary | ICD-10-CM | POA: Diagnosis not present

## 2017-09-05 DIAGNOSIS — I472 Ventricular tachycardia, unspecified: Secondary | ICD-10-CM

## 2017-09-05 DIAGNOSIS — I420 Dilated cardiomyopathy: Secondary | ICD-10-CM | POA: Diagnosis not present

## 2017-09-05 DIAGNOSIS — I5030 Unspecified diastolic (congestive) heart failure: Secondary | ICD-10-CM | POA: Insufficient documentation

## 2017-09-05 DIAGNOSIS — Z953 Presence of xenogenic heart valve: Secondary | ICD-10-CM | POA: Diagnosis not present

## 2017-09-05 DIAGNOSIS — Z48812 Encounter for surgical aftercare following surgery on the circulatory system: Secondary | ICD-10-CM | POA: Diagnosis not present

## 2017-09-07 ENCOUNTER — Encounter (HOSPITAL_COMMUNITY)
Admission: RE | Admit: 2017-09-07 | Discharge: 2017-09-07 | Disposition: A | Discharge status: Home / Self Care | Payer: Medicare Other | Source: Ambulatory Visit | Attending: Interventional Cardiology | Admitting: Interventional Cardiology

## 2017-09-07 ENCOUNTER — Encounter: Payer: Self-pay | Admitting: Interventional Cardiology

## 2017-09-07 ENCOUNTER — Encounter (HOSPITAL_COMMUNITY): Payer: Medicare Other

## 2017-09-07 ENCOUNTER — Ambulatory Visit (INDEPENDENT_AMBULATORY_CARE_PROVIDER_SITE_OTHER): Payer: Medicare Other | Admitting: Interventional Cardiology

## 2017-09-07 VITALS — BP 122/68 | HR 65 | Ht 69.5 in | Wt 156.6 lb

## 2017-09-07 DIAGNOSIS — E782 Mixed hyperlipidemia: Secondary | ICD-10-CM | POA: Diagnosis not present

## 2017-09-07 DIAGNOSIS — Z952 Presence of prosthetic heart valve: Secondary | ICD-10-CM | POA: Diagnosis not present

## 2017-09-07 DIAGNOSIS — R42 Dizziness and giddiness: Secondary | ICD-10-CM

## 2017-09-07 DIAGNOSIS — Z48812 Encounter for surgical aftercare following surgery on the circulatory system: Secondary | ICD-10-CM | POA: Diagnosis not present

## 2017-09-07 DIAGNOSIS — I25119 Atherosclerotic heart disease of native coronary artery with unspecified angina pectoris: Secondary | ICD-10-CM | POA: Diagnosis not present

## 2017-09-07 DIAGNOSIS — Z953 Presence of xenogenic heart valve: Secondary | ICD-10-CM | POA: Diagnosis not present

## 2017-09-07 DIAGNOSIS — I2510 Atherosclerotic heart disease of native coronary artery without angina pectoris: Secondary | ICD-10-CM

## 2017-09-07 NOTE — Progress Notes (Signed)
Cardiology Office Note   Date:  09/07/2017   ID:  Jaion, Lagrange 1941-09-08, MRN [id number]  PCP:  Darcus Austin, MD    No chief complaint on file.  AVR, CAD  Wt Readings from Last 3 Encounters:  09/07/17 156 lb 9.6 oz (71 kg)  08/19/17 154 lb (69.9 kg)  06/29/17 140 lb (63.5 kg)       History of Present Illness: Darin Simmons is a 76 y.o. male  who has had CAD, DES to RCA CTO .  He had an AVR in 6/29, complicated by a sternal wound infection.   He had some lightheadedness and saw Dr. Caryl Comes.  He had some postoperative arrhythmias, but no obvious correlation.  A monitor was placed.  He has had some episodes of lightheadedness, and visual changes.  He is doing well overall.  No angina.  He feels that his sternum has healed well.   Works out in the garden for 4 hours at a time.  No problems with that activity.    Past Medical History:  Diagnosis Date  . 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 Aortic stenosis   . Arthritis   . BPH (benign prostatic hyperplasia)   . CAD (coronary artery disease) 2004  . Dyspnea   . Heart attack ([facility]) 2004  . Heart murmur   . Hypercholesterolemia   . 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 PVD (peripheral vascular disease) ([place])   . Wound infection 04/2017    Past Surgical History:  Procedure Laterality Date  . AORTIC VALVE REPLACEMENT N/A 04/22/2017   Procedure: AORTIC VALVE REPLACEMENT (AVR) using Magna Ease size 59mm;  Surgeon: Gaye Pollack, MD;  Location: [facility];  Service: Open Heart Surgery;  Laterality: N/A;  . APPLICATION OF WOUND VAC N/A 05/13/2017   Procedure: WOUND VAC CHANGE;  Surgeon: Melrose Nakayama, MD;  Location: [facility];  Service: Thoracic;  Laterality: N/A;  . DENTAL SURGERY     removed  . hair follicle removed Left   . heart stent    . HERNIA REPAIR Bilateral 4/76/54   BIH and umbilical hernia repair  . INGUINAL HERNIA REPAIR Bilateral 01/18/2013   Procedure: LAPAROSCOPIC BILATERAL INGUINAL HERNIA  REPAIR;  Surgeon: Odis Hollingshead, MD;  Location: [facility];  Service: General;  Laterality: Bilateral;  . INSERTION OF MESH Bilateral 01/18/2013   Procedure: INSERTION OF MESH;  Surgeon: Odis Hollingshead, MD;  Location: [facility];  Service: General;  Laterality: Bilateral;  AND UMBILICUS  . STERNAL WOUND DEBRIDEMENT N/A 05/11/2017   Procedure: STERNAL WOUND IRRIGATION AND DEBRIDEMENT;  Surgeon: Melrose Nakayama, MD;  Location: [facility];  Service: Thoracic;  Laterality: N/A;  . TEE WITHOUT CARDIOVERSION N/A 04/22/2017   Procedure: TRANSESOPHAGEAL ECHOCARDIOGRAM (TEE);  Surgeon: Gaye Pollack, MD;  Location: [facility];  Service: Open Heart Surgery;  Laterality: N/A;  . UMBILICAL HERNIA REPAIR N/A 01/18/2013   Procedure: HERNIA REPAIR UMBILICAL ADULT;  Surgeon: Odis Hollingshead, MD;  Location: [facility];  Service: General;  Laterality: N/A;     Current Outpatient Prescriptions  Medication Sig Dispense Refill  . amoxicillin (AMOXIL) 500 MG capsule Take 4 capsules by mouth 1 hour prior to dental appointment 12 capsule 3  . nitroGLYCERIN (NITROSTAT) 0.4 MG SL tablet Place under the tongue every 5 (five) minutes as needed.     . pravastatin (PRAVACHOL) 20 MG tablet Take 1 tablet by mouth daily.  No current facility-administered medications for this visit.     Allergies:   Bee venom; Crestor [rosuvastatin]; Diphenhydramine hcl; and Statins    Social History:  The patient  reports that he quit smoking about 8 years ago. His smoking use included Cigarettes. He has a 50.00 pack-year smoking history. He has never used smokeless tobacco. He reports that he does not drink alcohol or use drugs.   Family History:  The patient's family history includes Heart disease in his father.    ROS:  Please see the history of present illness.   Otherwise, review of systems are positive for occasional lightheadedness.   All other systems are reviewed and negative.    PHYSICAL EXAM: VS:  BP 122/68   Pulse 65   Ht 5'  9.5" (1.765 m)   Wt 156 lb 9.6 oz (71 kg)   SpO2 97%   BMI 22.79 kg/m  , BMI Body mass index is 22.79 kg/m. GEN: Well nourished, well developed, in no acute distress  HEENT: normal  Neck: no JVD, carotid bruits, or masses Cardiac: RRR; 2/6 systolic murmur, no rubs, or gallops,no edema  Respiratory:  clear to auscultation bilaterally, normal work of breathing GI: soft, nontender, nondistended, + BS MS: no deformity or atrophy  Skin: warm and dry, no rash Neuro:  Strength and sensation are intact Psych: euthymic mood, full affect     Recent Labs: 04/20/2017: ALT 20 05/10/2017: Magnesium 2.1 08/19/2017: BUN 18; Creatinine, Ser 0.66; Hemoglobin 13.8; Platelets 145; Potassium 4.4; Sodium 142   Lipid Panel    Component Value Date/Time   CHOL 178 04/06/2017 0943   TRIG 115 04/06/2017 0943   HDL 47 04/06/2017 0943   CHOLHDL 3.8 04/06/2017 0943   CHOLHDL 2.4 08/27/2015 0901   VLDL 13 08/27/2015 0901   LDLCALC 108 (H) 04/06/2017 0943     Other studies Reviewed: Additional studies/ records that were reviewed today with results demonstrating: 09/05/2017 echo reviewed.   ASSESSMENT AND PLAN:  1. CAD: No angina. COntine aggressive secondary prevention.  2. Hyperlipidemia: Continue pravastatin. LDL well controlled.  3. AVR: s/p bioprosthetic valve.  COntinue SBE prophylaxis. 4. Lightheadedness: Will check monitor readings.  Unclear that this is related to any arrhythmia.    Current medicines are reviewed at length with the patient today.  The patient concerns regarding his medicines were addressed.  The following changes have been made:  No change  Labs/ tests ordered today include:  No orders of the defined types were placed in this encounter.   Recommend 150 minutes/week of aerobic exercise Low fat, low carb, high fiber diet recommended  Disposition:   FU in 6 months   Signed, Larae Grooms, MD  09/07/2017 12:09 PM    [facility] [place], [place], [place]  [address] Phone: [phone]; Fax: [phone]

## 2017-09-07 NOTE — Patient Instructions (Signed)

## 2017-09-08 DIAGNOSIS — E7800 Pure hypercholesterolemia, unspecified: Secondary | ICD-10-CM | POA: Diagnosis not present

## 2017-09-09 ENCOUNTER — Encounter (HOSPITAL_COMMUNITY): Payer: Medicare Other

## 2017-09-09 ENCOUNTER — Encounter (HOSPITAL_COMMUNITY)
Admission: RE | Admit: 2017-09-09 | Discharge: 2017-09-09 | Disposition: A | Discharge status: Home / Self Care | Payer: Medicare Other | Source: Ambulatory Visit | Attending: Interventional Cardiology | Admitting: Interventional Cardiology

## 2017-09-09 DIAGNOSIS — Z48812 Encounter for surgical aftercare following surgery on the circulatory system: Secondary | ICD-10-CM | POA: Diagnosis not present

## 2017-09-09 DIAGNOSIS — Z953 Presence of xenogenic heart valve: Secondary | ICD-10-CM | POA: Diagnosis not present

## 2017-09-09 DIAGNOSIS — Z952 Presence of prosthetic heart valve: Secondary | ICD-10-CM

## 2017-09-12 ENCOUNTER — Encounter (HOSPITAL_COMMUNITY)
Admission: RE | Admit: 2017-09-12 | Discharge: 2017-09-12 | Disposition: A | Discharge status: Home / Self Care | Payer: Medicare Other | Source: Ambulatory Visit | Attending: Interventional Cardiology | Admitting: Interventional Cardiology

## 2017-09-12 ENCOUNTER — Telehealth: Payer: Self-pay

## 2017-09-12 ENCOUNTER — Encounter (HOSPITAL_COMMUNITY): Payer: Medicare Other

## 2017-09-12 DIAGNOSIS — Z48812 Encounter for surgical aftercare following surgery on the circulatory system: Secondary | ICD-10-CM | POA: Diagnosis not present

## 2017-09-12 DIAGNOSIS — Z953 Presence of xenogenic heart valve: Secondary | ICD-10-CM | POA: Diagnosis not present

## 2017-09-12 DIAGNOSIS — Z952 Presence of prosthetic heart valve: Secondary | ICD-10-CM

## 2017-09-12 NOTE — Telephone Encounter (Signed)
Pt is aware and agreeable to normal echo results. Pt informed me that he is currently wearing a cardiac monitor and is curious to know what it is showing as he is still very symptomatic with SOB, dizziness, and blurred vision. He was grateful for the call

## 2017-09-12 NOTE — Telephone Encounter (Signed)
lmtcb for echo results.

## 2017-09-14 ENCOUNTER — Encounter (HOSPITAL_COMMUNITY)
Admission: RE | Admit: 2017-09-14 | Discharge: 2017-09-14 | Disposition: A | Discharge status: Home / Self Care | Payer: Medicare Other | Source: Ambulatory Visit | Attending: Interventional Cardiology | Admitting: Interventional Cardiology

## 2017-09-14 ENCOUNTER — Encounter (HOSPITAL_COMMUNITY): Payer: Medicare Other

## 2017-09-14 DIAGNOSIS — Z48812 Encounter for surgical aftercare following surgery on the circulatory system: Secondary | ICD-10-CM | POA: Diagnosis not present

## 2017-09-14 DIAGNOSIS — Z952 Presence of prosthetic heart valve: Secondary | ICD-10-CM

## 2017-09-14 DIAGNOSIS — Z953 Presence of xenogenic heart valve: Secondary | ICD-10-CM | POA: Diagnosis not present

## 2017-09-16 ENCOUNTER — Encounter (HOSPITAL_COMMUNITY): Payer: Medicare Other

## 2017-09-16 ENCOUNTER — Encounter (HOSPITAL_COMMUNITY)
Admission: RE | Admit: 2017-09-16 | Discharge: 2017-09-16 | Disposition: A | Discharge status: Home / Self Care | Payer: Medicare Other | Source: Ambulatory Visit | Attending: Interventional Cardiology | Admitting: Interventional Cardiology

## 2017-09-16 DIAGNOSIS — Z48812 Encounter for surgical aftercare following surgery on the circulatory system: Secondary | ICD-10-CM | POA: Diagnosis not present

## 2017-09-16 DIAGNOSIS — Z953 Presence of xenogenic heart valve: Secondary | ICD-10-CM | POA: Diagnosis not present

## 2017-09-16 DIAGNOSIS — Z952 Presence of prosthetic heart valve: Secondary | ICD-10-CM

## 2017-09-19 ENCOUNTER — Encounter (HOSPITAL_COMMUNITY)
Admission: RE | Admit: 2017-09-19 | Discharge: 2017-09-19 | Disposition: A | Discharge status: Home / Self Care | Payer: Medicare Other | Source: Ambulatory Visit | Attending: Interventional Cardiology | Admitting: Interventional Cardiology

## 2017-09-19 ENCOUNTER — Encounter (HOSPITAL_COMMUNITY): Payer: Medicare Other

## 2017-09-19 DIAGNOSIS — Z952 Presence of prosthetic heart valve: Secondary | ICD-10-CM

## 2017-09-19 DIAGNOSIS — Z953 Presence of xenogenic heart valve: Secondary | ICD-10-CM | POA: Diagnosis not present

## 2017-09-19 DIAGNOSIS — Z48812 Encounter for surgical aftercare following surgery on the circulatory system: Secondary | ICD-10-CM | POA: Diagnosis not present

## 2017-09-21 ENCOUNTER — Encounter (HOSPITAL_COMMUNITY)
Admission: RE | Admit: 2017-09-21 | Discharge: 2017-09-21 | Disposition: A | Discharge status: Home / Self Care | Payer: Medicare Other | Source: Ambulatory Visit | Attending: Interventional Cardiology | Admitting: Interventional Cardiology

## 2017-09-21 ENCOUNTER — Encounter (HOSPITAL_COMMUNITY): Payer: Medicare Other

## 2017-09-21 DIAGNOSIS — Z953 Presence of xenogenic heart valve: Secondary | ICD-10-CM | POA: Diagnosis not present

## 2017-09-21 DIAGNOSIS — Z48812 Encounter for surgical aftercare following surgery on the circulatory system: Secondary | ICD-10-CM | POA: Diagnosis not present

## 2017-09-21 DIAGNOSIS — Z952 Presence of prosthetic heart valve: Secondary | ICD-10-CM

## 2017-09-23 ENCOUNTER — Encounter (HOSPITAL_COMMUNITY): Payer: Medicare Other

## 2017-09-23 ENCOUNTER — Encounter (HOSPITAL_COMMUNITY)
Admission: RE | Admit: 2017-09-23 | Discharge: 2017-09-23 | Disposition: A | Discharge status: Home / Self Care | Payer: Medicare Other | Source: Ambulatory Visit | Attending: Interventional Cardiology | Admitting: Interventional Cardiology

## 2017-09-23 DIAGNOSIS — Z952 Presence of prosthetic heart valve: Secondary | ICD-10-CM

## 2017-09-23 DIAGNOSIS — Z953 Presence of xenogenic heart valve: Secondary | ICD-10-CM | POA: Insufficient documentation

## 2017-09-23 DIAGNOSIS — Z48812 Encounter for surgical aftercare following surgery on the circulatory system: Secondary | ICD-10-CM | POA: Diagnosis not present

## 2017-09-23 NOTE — Progress Notes (Signed)
Cardiac Individual Treatment Plan  Patient Details  Name: Darin Simmons MRN: [id number] Date of Birth: [birth year] Referring Provider:     CARDIAC REHAB PHASE II ORIENTATION from 06/28/2017 in [place]  Referring Provider  Larae Grooms, MD.      Initial Encounter Date:    CARDIAC REHAB PHASE II ORIENTATION from 06/28/2017 in [place]  Date  06/28/17  Referring Provider  Larae Grooms, MD.      Visit Diagnosis: S/P AVR  Patient's Home Medications on Admission:  Current Outpatient Prescriptions:  .  amoxicillin (AMOXIL) 500 MG capsule, Take 4 capsules by mouth 1 hour prior to dental appointment, Disp: 12 capsule, Rfl: 3 .  nitroGLYCERIN (NITROSTAT) 0.4 MG SL tablet, Place under the tongue every 5 (five) minutes as needed. , Disp: , Rfl:  .  pravastatin (PRAVACHOL) 20 MG tablet, Take 1 tablet by mouth daily., Disp: , Rfl:   Past Medical History: Past Medical History:  Diagnosis Date  . 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 Aortic stenosis   . Arthritis   . BPH (benign prostatic hyperplasia)   . CAD (coronary artery disease) 2004  . Dyspnea   . Heart attack ([place]) 2004  . Heart murmur   . Hypercholesterolemia   . 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 PVD (peripheral vascular disease) ([place])   . Wound infection 04/2017    Tobacco Use: History  Smoking Status  . Former Smoker  . Packs/day: 1.00  . Years: 50.00  . Types: Cigarettes  . Quit date: 11/22/2008  Smokeless Tobacco  . Never Used    Labs: Recent Review Flowsheet Data    Labs for ITP Cardiac and Pulmonary Rehab Latest Ref Rng & Units 04/22/2017 04/22/2017 04/22/2017 04/23/2017 05/05/2017   Cholestrol 100 - 199 mg/dL - - - - -   LDLCALC 0 - 99 mg/dL - - - - -   HDL >39 mg/dL - - - - -   Trlycerides 0 - 149 mg/dL - - - - -   Hemoglobin A1c 4.8 - 5.6 % - - - - 5.1   PHART 7.350 - 7.450 7.480(H) 7.352 - - -   PCO2ART 32.0 - 48.0 mmHg  29.5(L) 38.7 - - -   HCO3 20.0 - 28.0 mmol/L 21.9 21.3 - - -   TCO2 0 - 100 mmol/L _0 -   ACIDBASEDEF 0.0 - 2.0 mmol/L 1.0 4.0(H) - - -   O2SAT % 99.0 98.0 - - -      Capillary Blood Glucose: Lab Results  Component Value Date   GLUCAP 84 04/24/2017   GLUCAP 116 (H) 04/24/2017   GLUCAP 82 04/23/2017   GLUCAP 147 (H) 04/23/2017   GLUCAP 120 (H) 04/23/2017     Exercise Target Goals:    Exercise Program Goal: Individual exercise prescription set with THRR, safety & activity barriers. Participant demonstrates ability to understand and report RPE using BORG scale, to self-measure pulse accurately, and to acknowledge the importance of the exercise prescription.  Exercise Prescription Goal: Starting with aerobic activity 30 plus minutes a day, 3 days per week for initial exercise prescription. Provide home exercise prescription and guidelines that participant acknowledges understanding prior to discharge.  Activity Barriers & Risk Stratification:     Activity Barriers & Cardiac Risk Stratification - 06/28/17 1021      Activity Barriers & Cardiac Risk Stratification   Activity Barriers None  Cardiac Risk Stratification Moderate      6 Minute Walk:     6 Minute Walk    Row Name 06/28/17 1018         6 Minute Walk   Phase Initial     Distance 2016 feet     Walk Time 6 minutes     # of Rest Breaks 0     MPH 3.82     METS 4.61     RPE 12     VO2 Peak 16.13     Symptoms No     Resting HR 76 bpm     Resting BP 112/64     Max Ex. HR 104 bpm     Max Ex. BP 150/70     2 Minute Post BP 118/66        Oxygen Initial Assessment:   Oxygen Re-Evaluation:   Oxygen Discharge (Final Oxygen Re-Evaluation):   Initial Exercise Prescription:     Initial Exercise Prescription - 06/28/17 1000      Date of Initial Exercise RX and Referring Provider   Date 06/28/17   Referring Provider Larae Grooms, MD.     Bike   Level 1.2   Minutes 10   METs 4.48      NuStep   Level 4   SPM 85   Minutes 10   METs 3     Track   Laps 13   Minutes 10   METs 3.26     Prescription Details   Frequency (times per week) 3   Duration Progress to 30 minutes of continuous aerobic without signs/symptoms of physical distress     Intensity   THRR 40-80% of Max Heartrate 58-117   Ratings of Perceived Exertion 11-13   Perceived Dyspnea 0-4     Progression   Progression Continue to progress workloads to maintain intensity without signs/symptoms of physical distress.     Resistance Training   Training Prescription Yes   Weight 3lbs.   Reps 10-15      Perform Capillary Blood Glucose checks as needed.  Exercise Prescription Changes:     Exercise Prescription Changes    Row Name 07/05/17 1100 07/22/17 1516 08/01/17 1600 08/19/17 1619 08/22/17 1600     Response to Exercise   Blood Pressure (Admit) 132/74 114/70 126/62 134/70 130/80   Blood Pressure (Exercise) 144/82 121/62 152/70 120/80 140/80   Blood Pressure (Exit) 104/62 112/60 108/62 120/80 118/62   Heart Rate (Admit) 72 bpm 71 bpm 75 bpm 76 bpm 66 bpm   Heart Rate (Exercise) 117 bpm 99 bpm 94 bpm 100 bpm 101 bpm   Heart Rate (Exit) 77 bpm 70 bpm 74 bpm 76 bpm 66 bpm   Rating of Perceived Exertion (Exercise) _0 Symptoms none none mild dizziness this morning when bending over to tie shoes  -  -   Comments Pt was oriented to exercise equipment on 07/04/17  - pt did not feel like himself. Pt did not work as hard in cardiac rehab today  -  -   Duration Continue with 30 min of aerobic exercise without signs/symptoms of physical distress. Continue with 30 min of aerobic exercise without signs/symptoms of physical distress. Continue with 30 min of aerobic exercise without signs/symptoms of physical distress. Continue with 30 min of aerobic exercise without signs/symptoms of physical distress. Continue with 30 min of aerobic exercise without signs/symptoms of physical distress.    Intensity THRR unchanged THRR unchanged  THRR unchanged THRR unchanged THRR unchanged     Progression   Progression Continue to progress workloads to maintain intensity without signs/symptoms of physical distress. Continue to progress workloads to maintain intensity without signs/symptoms of physical distress. Continue to progress workloads to maintain intensity without signs/symptoms of physical distress. Continue to progress workloads to maintain intensity without signs/symptoms of physical distress. Continue to progress workloads to maintain intensity without signs/symptoms of physical distress.   Average METs 3.6 4 3.5 4 3.9     Resistance Training   Training Prescription _0    Weight 3lbs. 5lbs 4lbs 5lbs 5lbs   Reps 10-15 10-15 10-15 10-15 10-15   Time 10 Minutes 10 Minutes 10 Minutes 10 Minutes 10 Minutes     Bike   Level 1.2 1.3 1.3 1.3 1.3   Minutes _1 METs 4.48 4.65 4.65 4.63 4.51     NuStep   Level _2 SPM 80 80 70 70 70   Minutes 10 90 90 10 90   METs 2.7 3.6 2.7 3.6 3.9     Track   Laps _3 Minutes _4 METs 3.6 3.79 3.26 3.26 3.26     Home Exercise Plan   Plans to continue exercise at  - Home (comment) Home (comment) Home (comment) Home (comment)   Frequency  - Add 3 additional days to program exercise sessions. Add 3 additional days to program exercise sessions. Add 3 additional days to program exercise sessions. Add 3 additional days to program exercise sessions.   Initial Home Exercises Provided  - 07/13/17 07/13/17 07/13/17 07/13/17   Row Name 09/12/17 1500             Response to Exercise   Blood Pressure (Admit) 130/68       Blood Pressure (Exercise) 142/80       Blood Pressure (Exit) 108/60       Heart Rate (Admit) 71 bpm       Heart Rate (Exercise) 110 bpm       Heart Rate (Exit) 79 bpm       Rating of Perceived Exertion (Exercise) 13       Duration Continue with 30 min of aerobic exercise  without signs/symptoms of physical distress.       Intensity THRR unchanged         Progression   Progression Continue to progress workloads to maintain intensity without signs/symptoms of physical distress.       Average METs 3.9         Resistance Training   Training Prescription Yes       Weight 5lbs       Reps 10-15       Time 10 Minutes         Bike   Level 1.3       Minutes 10       METs 4.51         NuStep   Level 5       SPM 80       Minutes 10       METs 3.8         Track   Laps 14       Minutes 10       METs 3.43         Home Exercise Plan   Plans to continue  exercise at Home (comment)       Frequency Add 3 additional days to program exercise sessions.       Initial Home Exercises Provided 07/13/17          Exercise Comments:     Exercise Comments    Row Name 07/04/17 1145 08/02/17 0820 08/30/17 1237 09/20/17 1402     Exercise Comments Pt was oriented to exercise equipment today. Pt responded well to exercise session and denied signs/symptoms of CP, dizziness or unusual SOB. Will continue to monitor pt's actvity levels. Reviewed METs and goals. Pt is tolerating exercises and WL increases very well; will continue to monitor pt's progress and actvity levels. Reviewed METs and goals. Pt is tolerating exercises and WL increases very well; will continue to monitor pt's progress and actvity levels. Reviewed METs and goals. Pt is tolerating exercises and WL increases very well; will continue to monitor pt's progress and actvity levels.       Exercise Goals and Review:     Exercise Goals    Row Name 06/28/17 1024             Exercise Goals   Increase Physical Activity Yes       Intervention Provide advice, education, support and counseling about physical activity/exercise needs.;Develop an individualized exercise prescription for aerobic and resistive training based on initial evaluation findings, risk stratification, comorbidities and participant's  personal goals.       Expected Outcomes Achievement of increased cardiorespiratory fitness and enhanced flexibility, muscular endurance and strength shown through measurements of functional capacity and personal statement of participant.       Increase Strength and Stamina Yes       Intervention Provide advice, education, support and counseling about physical activity/exercise needs.;Develop an individualized exercise prescription for aerobic and resistive training based on initial evaluation findings, risk stratification, comorbidities and participant's personal goals.       Expected Outcomes Achievement of increased cardiorespiratory fitness and enhanced flexibility, muscular endurance and strength shown through measurements of functional capacity and personal statement of participant.          Exercise Goals Re-Evaluation :     Exercise Goals Re-Evaluation    Row Name 07/13/17 1044 08/02/17 0821 08/30/17 1237 09/20/17 1402       Exercise Goal Re-Evaluation   Exercise Goals Review Increase Strenth and Stamina;Increase Physical Activity Increase Physical Activity;Able to understand and use rate of perceived exertion (RPE) scale;Knowledge and understanding of Target Heart Rate Range (THRR);Understanding of Exercise Prescription;Increase Strength and Stamina;Able to check pulse independently Increase Physical Activity;Able to understand and use rate of perceived exertion (RPE) scale;Knowledge and understanding of Target Heart Rate Range (THRR);Understanding of Exercise Prescription;Increase Strength and Stamina;Able to check pulse independently Increase Physical Activity;Able to understand and use rate of perceived exertion (RPE) scale;Knowledge and understanding of Target Heart Rate Range (THRR);Understanding of Exercise Prescription;Increase Strength and Stamina;Able to check pulse independently    Comments Reviewed home exercise with pt today.  Pt plans to walk for exercise, 3x/week in addition to  coming to cardiac rehab.  Reviewed THR, pulse, RPE, sign and symptoms, NTG use, and when to call 911 or MD.  Also discussed weather considerations and indoor options.  Pt voiced understanding. Pt is actively involved with HEP and is walking at home 3x/week in addition to coming to cardiac rehab. pt stated, " having increase understanding exercise/activuty limitations." Pt also stated that UE strength is increasing and seeing more muscle definition throughout. pt recentlly returned from  trrip to [place] (home) , visiting mother, in which we did lots of walking. Upon return pt felt more fatigue/tired and think it is related to valve. MD notified. Pt is active with yardwork an d household chores. Pt unloaded truck load of gravel without difficulty. Pt did make mention of fatigue with inclines and stairs. Pt introduced stairs to walking track to help increase endurance will climbing hills and stairs.    Expected Outcomes Pt will be compliant with HEP and improve in cardiorespiratory fitness. Pt will be compliant with HEP and improve in cardiorespiratory fitness and overall strength. Pt will continue to improve in cardiorespiratory fitness and will f/u with cardiology team for echocardiogram on October 10th  Pt will continue to improve in cardiorespiratory fitness and functional capacity.        Discharge Exercise Prescription (Final Exercise Prescription Changes):     Exercise Prescription Changes - 09/12/17 1500      Response to Exercise   Blood Pressure (Admit) 130/68   Blood Pressure (Exercise) 142/80   Blood Pressure (Exit) 108/60   Heart Rate (Admit) 71 bpm   Heart Rate (Exercise) 110 bpm   Heart Rate (Exit) 79 bpm   Rating of Perceived Exertion (Exercise) 13   Duration Continue with 30 min of aerobic exercise without signs/symptoms of physical distress.   Intensity THRR unchanged     Progression   Progression Continue to progress workloads to maintain intensity without signs/symptoms of  physical distress.   Average METs 3.9     Resistance Training   Training Prescription Yes   Weight 5lbs   Reps 10-15   Time 10 Minutes     Bike   Level 1.3   Minutes 10   METs 4.51     NuStep   Level 5   SPM 80   Minutes 10   METs 3.8     Track   Laps 14   Minutes 10   METs 3.43     Home Exercise Plan   Plans to continue exercise at Home (comment)   Frequency Add 3 additional days to program exercise sessions.   Initial Home Exercises Provided 07/13/17      Nutrition:  Target Goals: Understanding of nutrition guidelines, daily intake of sodium <1542m, cholesterol <2054m calories 30% from fat and 7% or less from saturated fats, daily to have 5 or more servings of fruits and vegetables.  Biometrics:     Pre Biometrics - 06/28/17 1022      Pre Biometrics   Height _0  (1.778 m)   Weight 142 lb 10.2 oz (64.7 kg)   Waist Circumference 31.5 inches   Hip Circumference 36 inches   Waist to Hip Ratio 0.88 %   BMI (Calculated) 20.5   Triceps Skinfold 12 mm   % Body Fat 20.1 %   Grip Strength 33.5 kg   Flexibility 9 in   Single Leg Stand 21 seconds       Nutrition Therapy Plan and Nutrition Goals:     Nutrition Therapy & Goals - 06/28/17 1412      Nutrition Therapy   Diet Therapeutic Lifestyle Changes     Personal Nutrition Goals   Nutrition Goal Pt to describe the benefit of including fruits, vegetables, whole grains, and low-fat dairy products in a heart healthy meal plan.     Intervention Plan   Intervention Prescribe, educate and counsel regarding individualized specific dietary modifications aiming towards targeted core components such as weight, hypertension, lipid management, diabetes,  heart failure and other comorbidities.   Expected Outcomes Short Term Goal: Understand basic principles of dietary content, such as calories, fat, sodium, cholesterol and nutrients.;Long Term Goal: Adherence to prescribed nutrition plan.      Nutrition Discharge:  Nutrition Scores:     Nutrition Assessments - 06/28/17 1413      MEDFICTS Scores   Pre Score 68      Nutrition Goals Re-Evaluation:   Nutrition Goals Re-Evaluation:   Nutrition Goals Discharge (Final Nutrition Goals Re-Evaluation):   Psychosocial: Target Goals: Acknowledge presence or absence of significant depression and/or stress, maximize coping skills, provide positive support system. Participant is able to verbalize types and ability to use techniques and skills needed for reducing stress and depression.  Initial Review & Psychosocial Screening:     Initial Psych Review & Screening - 06/28/17 1154      Initial Review   Current issues with Current Stress Concerns   Source of Stress Concerns Chronic Illness   Comments upon bried assessment, pt exhibits health related anxiety      Family Dynamics   Good Support System? Yes  pt identifies himself as support, spouse and friends as positive influence      Barriers   Psychosocial barriers to participate in program There are no identifiable barriers or psychosocial needs.     Screening Interventions   Interventions Encouraged to exercise;Provide feedback about the scores to participant      Quality of Life Scores:     Quality of Life - 07/15/17 1655      Quality of Life Scores   Family Pre --  pt is eager and anxious for trip to [place] next month to visit his mother. He is fearful the travel will be very taxing on his this year. pt instructed to contact airline for assistance. pt offered emotional support and reassurance.    GLOBAL Pre --  overall QOL scores good. pt has health related, aging related and generalized anxieties which he verbalizes good coping skills. pt encouraged to particpate in meditation/mindfulness class and stress mgmt class.       PHQ-9: Recent Review Flowsheet Data    Depression screen [facility] 2/9 07/04/2017 06/21/2017   Decreased Interest 0 0   Down, Depressed, Hopeless 1 1   PHQ - 2 Score  1 1     Interpretation of Total Score  Total Score Depression Severity:  1-4 = Minimal depression, 5-9 = Mild depression, 10-14 = Moderate depression, 15-19 = Moderately severe depression, 20-27 = Severe depression   Psychosocial Evaluation and Intervention:     Psychosocial Evaluation - 07/06/17 1741      Psychosocial Evaluation & Interventions   Interventions Encouraged to exercise with the program and follow exercise prescription;Stress management education;Relaxation education   Comments pt exhibits mild health related stress/anxiety.  This is decreasing with CR activities.  otherwise no apparent psychosocial needs identified, no interventions necessary    Expected Outcomes pt will exhibit positive outlook with good coping skills.    Continue Psychosocial Services  No Follow up required      Psychosocial Re-Evaluation:     Psychosocial Re-Evaluation    [place] Name 07/06/17 1743 07/29/17 1647 08/24/17 1027 09/20/17 0805       Psychosocial Re-Evaluation   Current issues with Current Anxiety/Panic;Current Stress Concerns Current Anxiety/Panic;Current Stress Concerns Current Anxiety/Panic;Current Stress Concerns Current Anxiety/Panic;Current Stress Concerns    Comments Health related stress/anxiety improving with CR activities.  otherwise no psychosocial needs identified, no interventions necessary. Health related  stress/anxiety improving with CR activities. pt demonstrates decreased stress/anxiety with exercise and CR participation. pt enthusiastically interacts with his peers. pt verbalizes decreased anxiety about planned [place] trip next week.  otherwise no psychosocial needs identified, no interventions necessary. Health related stress/anxiety improving with CR activities. pt demonstrates decreased stress/anxiety with exercise and CR participation. pt enthusiastically interacts with his peers. pt tolerated [place] trip without significant anxiety.  no psychosocial needs identified, no  interventions necessary. Health related stress/anxiety improving with CR activities. pt demonstrates decreased stress/anxiety with exercise and CR participation. pt enthusiastically interacts with his peers. pt continues to exhibit health related anxiety.   no psychosocial needs identified, no interventions necessary.    Expected Outcomes pt will exhibit positive outlook with good coping skills.  pt will exhibit positive outlook with good coping skills.  pt will exhibit positive outlook with good coping skills.  pt will exhibit positive outlook with good coping skills.     Interventions Stress management education;Relaxation education;Encouraged to attend Cardiac Rehabilitation for the exercise Stress management education;Relaxation education;Encouraged to attend Cardiac Rehabilitation for the exercise Stress management education;Relaxation education;Encouraged to attend Cardiac Rehabilitation for the exercise Stress management education;Relaxation education;Encouraged to attend Cardiac Rehabilitation for the exercise    Continue Psychosocial Services  No Follow up required No Follow up required No Follow up required No Follow up required    Comments  -  - upon bried assessment, pt exhibits health related anxiety  upon bried assessment, pt exhibits health related anxiety       Initial Review   Source of Stress Concerns  -  - Chronic Illness Chronic Illness       Psychosocial Discharge (Final Psychosocial Re-Evaluation):     Psychosocial Re-Evaluation - 09/20/17 0805      Psychosocial Re-Evaluation   Current issues with Current Anxiety/Panic;Current Stress Concerns   Comments Health related stress/anxiety improving with CR activities. pt demonstrates decreased stress/anxiety with exercise and CR participation. pt enthusiastically interacts with his peers. pt continues to exhibit health related anxiety.   no psychosocial needs identified, no interventions necessary.   Expected Outcomes pt will  exhibit positive outlook with good coping skills.    Interventions Stress management education;Relaxation education;Encouraged to attend Cardiac Rehabilitation for the exercise   Continue Psychosocial Services  No Follow up required   Comments upon bried assessment, pt exhibits health related anxiety      Initial Review   Source of Stress Concerns Chronic Illness      Vocational Rehabilitation: Provide vocational rehab assistance to qualifying candidates.   Vocational Rehab Evaluation & Intervention:   Education: Education Goals: Education classes will be provided on a weekly basis, covering required topics. Participant will state understanding/return demonstration of topics presented.  Learning Barriers/Preferences:     Learning Barriers/Preferences - 06/28/17 1025      Learning Barriers/Preferences   Learning Barriers Sight;Hearing   Learning Preferences Written Material      Education Topics: Count Your Pulse:  -Group instruction provided by verbal instruction, demonstration, patient participation and written materials to support subject.  Instructors address importance of being able to find your pulse and how to count your pulse when at home without a heart monitor.  Patients get hands on experience counting their pulse with staff help and individually.   CARDIAC REHAB PHASE II EXERCISE from 09/23/2017 in [place]  Date  09/23/17  Instruction Review Code  2- meets goals/outcomes      Heart Attack, Angina, and Risk Factor Modification:  -  Group instruction provided by verbal instruction, video, and written materials to support subject.  Instructors address signs and symptoms of angina and heart attacks.    Also discuss risk factors for heart disease and how to make changes to improve heart health risk factors.   CARDIAC REHAB PHASE II EXERCISE from 09/23/2017 in [place]  Date  09/21/17  Instruction Review  Code  2- meets goals/outcomes      Functional Fitness:  -Group instruction provided by verbal instruction, demonstration, patient participation, and written materials to support subject.  Instructors address safety measures for doing things around the house.  Discuss how to get up and down off the floor, how to pick things up properly, how to safely get out of a chair without assistance, and balance training.   CARDIAC REHAB PHASE II EXERCISE from 09/23/2017 in [place]  Date  09/09/17  Instruction Review Code  2- meets goals/outcomes      Meditation and Mindfulness:  -Group instruction provided by verbal instruction, patient participation, and written materials to support subject.  Instructor addresses importance of mindfulness and meditation practice to help reduce stress and improve awareness.  Instructor also leads participants through a meditation exercise.    CARDIAC REHAB PHASE II EXERCISE from 09/23/2017 in [place]  Date  08/24/17  Instruction Review Code  2- meets goals/outcomes      Stretching for Flexibility and Mobility:  -Group instruction provided by verbal instruction, patient participation, and written materials to support subject.  Instructors lead participants through series of stretches that are designed to increase flexibility thus improving mobility.  These stretches are additional exercise for major muscle groups that are typically performed during regular warm up and cool down.   CARDIAC REHAB PHASE II EXERCISE from 09/23/2017 in [place]  Date  09/14/17  Instruction Review Code  2- meets goals/outcomes      Hands Only CPR:  -Group verbal, video, and participation provides a basic overview of AHA guidelines for community CPR. Role-play of emergencies allow participants the opportunity to practice calling for help and chest compression technique with discussion of AED  use.   Hypertension: -Group verbal and written instruction that provides a basic overview of hypertension including the most recent diagnostic guidelines, risk factor reduction with self-care instructions and medication management.    Nutrition I class: Heart Healthy Eating:  -Group instruction provided by PowerPoint slides, verbal discussion, and written materials to support subject matter. The instructor gives an explanation and review of the Therapeutic Lifestyle Changes diet recommendations, which includes a discussion on lipid goals, dietary fat, sodium, fiber, plant stanol/sterol esters, sugar, and the components of a well-balanced, healthy diet.   Nutrition II class: Lifestyle Skills:  -Group instruction provided by PowerPoint slides, verbal discussion, and written materials to support subject matter. The instructor gives an explanation and review of label reading, grocery shopping for heart health, heart healthy recipe modifications, and ways to make healthier choices when eating out.   Diabetes Question & Answer:  -Group instruction provided by PowerPoint slides, verbal discussion, and written materials to support subject matter. The instructor gives an explanation and review of diabetes co-morbidities, pre- and post-prandial blood glucose goals, pre-exercise blood glucose goals, signs, symptoms, and treatment of hypoglycemia and hyperglycemia, and foot care basics.   CARDIAC REHAB PHASE II EXERCISE from 09/23/2017 in [place]  Date  09/02/17  Educator  RD  Instruction Review Code  2- meets goals/outcomes      Diabetes Blitz:  -Group instruction provided by PowerPoint slides, verbal discussion, and written materials to support subject matter. The instructor gives an explanation and review of the physiology behind type 1 and type 2 diabetes, diabetes medications and rational behind using different medications, pre- and post-prandial blood glucose  recommendations and Hemoglobin A1c goals, diabetes diet, and exercise including blood glucose guidelines for exercising safely.    Portion Distortion:  -Group instruction provided by PowerPoint slides, verbal discussion, written materials, and food models to support subject matter. The instructor gives an explanation of serving size versus portion size, changes in portions sizes over the last 20 years, and what consists of a serving from each food group.   CARDIAC REHAB PHASE II EXERCISE from 09/23/2017 in [place]  Date  09/07/17  Educator  RD  Instruction Review Code  2- meets goals/outcomes      Stress Management:  -Group instruction provided by verbal instruction, video, and written materials to support subject matter.  Instructors review role of stress in heart disease and how to cope with stress positively.     CARDIAC REHAB PHASE II EXERCISE from 09/23/2017 in [place]  Date  09/16/17  Instruction Review Code  2- meets goals/outcomes      Exercising on Your Own:  -Group instruction provided by verbal instruction, power point, and written materials to support subject.  Instructors discuss benefits of exercise, components of exercise, frequency and intensity of exercise, and end points for exercise.  Also discuss use of nitroglycerin and activating EMS.  Review options of places to exercise outside of rehab.  Review guidelines for sex with heart disease.   CARDIAC REHAB PHASE II EXERCISE from 09/23/2017 in [place]  Date  07/27/17  Instruction Review Code  2- meets goals/outcomes      Cardiac Drugs I:  -Group instruction provided by verbal instruction and written materials to support subject.  Instructor reviews cardiac drug classes: antiplatelets, anticoagulants, beta blockers, and statins.  Instructor discusses reasons, side effects, and lifestyle considerations for each drug  class.   Cardiac Drugs II:  -Group instruction provided by verbal instruction and written materials to support subject.  Instructor reviews cardiac drug classes: angiotensin converting enzyme inhibitors (ACE-I), angiotensin II receptor blockers (ARBs), nitrates, and calcium channel blockers.  Instructor discusses reasons, side effects, and lifestyle considerations for each drug class.   CARDIAC REHAB PHASE II EXERCISE from 09/23/2017 in [place]  Date  08/31/17  Instruction Review Code  2- meets goals/outcomes      Anatomy and Physiology of the Circulatory System:  Group verbal and written instruction and models provide basic cardiac anatomy and physiology, with the coronary electrical and arterial systems. Review of: AMI, Angina, Valve disease, Heart Failure, Peripheral Artery Disease, Cardiac Arrhythmia, Pacemakers, and the ICD.   Other Education:  -Group or individual verbal, written, or video instructions that support the educational goals of the cardiac rehab program.   Knowledge Questionnaire Score:     Knowledge Questionnaire Score - 06/28/17 1032      Knowledge Questionnaire Score   Pre Score 22/24      Core Components/Risk Factors/Patient Goals at Admission:     Personal Goals and Risk Factors at Admission - 06/28/17 1059      Core Components/Risk Factors/Patient Goals on Admission   Lipids Yes  Intervention Provide education and support for participant on nutrition & aerobic/resistive exercise along with prescribed medications to achieve LDL <49m, HDL >467m   Expected Outcomes Long Term: Cholesterol controlled with medications as prescribed, with individualized exercise RX and with personalized nutrition plan. Value goals: LDL < 7057mHDL > 40 mg.;Short Term: Participant states understanding of desired cholesterol values and is compliant with medications prescribed. Participant is following exercise prescription and nutrition  guidelines.   Stress Yes   Intervention Offer individual and/or small group education and counseling on adjustment to heart disease, stress management and health-related lifestyle change. Teach and support self-help strategies.;Refer participants experiencing significant psychosocial distress to appropriate mental health specialists for further evaluation and treatment. When possible, include family members and significant others in education/counseling sessions.   Expected Outcomes Long Term: Emotional wellbeing is indicated by absence of clinically significant psychosocial distress or social isolation.;Short Term: Participant demonstrates changes in health-related behavior, relaxation and other stress management skills, ability to obtain effective social support, and compliance with psychotropic medications if prescribed.   Personal Goal Other Yes   Personal Goal Have energy to travel. Have energy to organize house and work in garden.   Intervention Provide aerobic exercise prgram to help increase strength and stamina, increase in energy expenditure as evidenced by increasing MET level, and increase functional capacity as evidenced by 6-minute walk test.   Expected Outcomes Patient will have sufficient energy to travel and take trip to [place] September 2018. Patient will have sufficient energy to do household chores including organizing household items and garden.      Core Components/Risk Factors/Patient Goals Review:      Goals and Risk Factor Review    Row Name 07/06/17 1737 07/29/17 1646 08/24/17 1026 09/20/17 0801 09/23/17 1044     Core Components/Risk Factors/Patient Goals Review   Personal Goals Review Stress;Lipids;Other Stress;Lipids;Other Stress;Lipids;Other Develop more efficient breathing techniques such as purse lipped breathing and diaphragmatic breathing and practicing self-pacing with activity. Develop more efficient breathing techniques such as purse lipped breathing and  diaphragmatic breathing and practicing self-pacing with activity.   Review pt with few CAD risk factors.  pt is looking forward to increased strength/stamina and energy to be able to visit [place] a few months. pt seems eager to participate in CR activities pt with few CAD risk factors.  pt is looking forward to increased strength/stamina and energy to be able to [place]. pt has made appropriate travel arrangements and is looking forward to his trip.  pt seems eager to participate in CR activities and enthusiastically interacts with his peers.    pt with few CAD risk factors.  pt [place] with exception of experiencing dizziness.  Symptoms being evaluated by cardiology.  pt seems eager to participate in CR activities and enthusiastically interacts with his peers.   pt with few CAD risk factors.  pt seems eager to participate in CR activities and enthusiastically interacts with his peers.  pt reports decreased dizziness and dyspnea on exertion.     Expected Outcomes pt will participate in CR exercise, nutrition and education opportunities to decrease overall RF.   pt will participate in CR exercise, nutrition and education opportunities to decrease overall RF.   pt will participate in CR exercise, nutrition and education opportunities to decrease overall RF.   pt will participate in CR exercise, nutrition and education opportunities to decrease overall RF.   pt will participate in CR exercise, nutrition and education opportunities to  decrease overall RF.        Core Components/Risk Factors/Patient Goals at Discharge (Final Review):      Goals and Risk Factor Review - 09/23/17 1044      Core Components/Risk Factors/Patient Goals Review   Personal Goals Review Develop more efficient breathing techniques such as purse lipped breathing and diaphragmatic breathing and practicing self-pacing with activity.   Review pt reports decreased dizziness and dyspnea on exertion.      Expected Outcomes pt will participate in CR exercise, nutrition and education opportunities to decrease overall RF.        ITP Comments:     ITP Comments    Row Name 06/28/17 1580 08/03/17 1616 08/30/17 1548 09/20/17 0801     ITP Comments Dr. Fransico Him, Medical Director  Dr. Fransico Him, Medical Director  30 day ITP review.  Pt with good participation and attendance.  No change in current regimen unless directed by Medical Director.   30 day ITP review.  Pt with good participation and attendance.  No change in current regimen unless directed by Medical Director.         Comments:

## 2017-09-26 ENCOUNTER — Encounter (HOSPITAL_COMMUNITY): Payer: Medicare Other

## 2017-09-26 ENCOUNTER — Encounter (HOSPITAL_COMMUNITY)
Admission: RE | Admit: 2017-09-26 | Discharge: 2017-09-26 | Disposition: A | Discharge status: Home / Self Care | Payer: Medicare Other | Source: Ambulatory Visit | Attending: Interventional Cardiology | Admitting: Interventional Cardiology

## 2017-09-26 DIAGNOSIS — Z953 Presence of xenogenic heart valve: Secondary | ICD-10-CM | POA: Diagnosis not present

## 2017-09-26 DIAGNOSIS — Z952 Presence of prosthetic heart valve: Secondary | ICD-10-CM

## 2017-09-26 DIAGNOSIS — Z48812 Encounter for surgical aftercare following surgery on the circulatory system: Secondary | ICD-10-CM | POA: Diagnosis not present

## 2017-09-28 ENCOUNTER — Encounter (HOSPITAL_COMMUNITY)
Admission: RE | Admit: 2017-09-28 | Discharge: 2017-09-28 | Disposition: A | Discharge status: Home / Self Care | Payer: Medicare Other | Source: Ambulatory Visit | Attending: Interventional Cardiology | Admitting: Interventional Cardiology

## 2017-09-28 ENCOUNTER — Encounter (HOSPITAL_COMMUNITY): Payer: Medicare Other

## 2017-09-28 ENCOUNTER — Other Ambulatory Visit: Payer: Self-pay | Admitting: Internal Medicine

## 2017-09-28 DIAGNOSIS — R42 Dizziness and giddiness: Secondary | ICD-10-CM

## 2017-09-28 DIAGNOSIS — Z952 Presence of prosthetic heart valve: Secondary | ICD-10-CM

## 2017-09-28 DIAGNOSIS — Z953 Presence of xenogenic heart valve: Secondary | ICD-10-CM | POA: Diagnosis not present

## 2017-09-28 DIAGNOSIS — I472 Ventricular tachycardia, unspecified: Secondary | ICD-10-CM

## 2017-09-28 DIAGNOSIS — Z48812 Encounter for surgical aftercare following surgery on the circulatory system: Secondary | ICD-10-CM | POA: Diagnosis not present

## 2017-09-30 ENCOUNTER — Encounter (HOSPITAL_COMMUNITY)
Admission: RE | Admit: 2017-09-30 | Discharge: 2017-09-30 | Disposition: A | Discharge status: Home / Self Care | Payer: Medicare Other | Source: Ambulatory Visit | Attending: Interventional Cardiology | Admitting: Interventional Cardiology

## 2017-09-30 ENCOUNTER — Encounter (HOSPITAL_COMMUNITY): Payer: Medicare Other

## 2017-09-30 VITALS — Ht 70.0 in | Wt 156.5 lb

## 2017-09-30 DIAGNOSIS — Z48812 Encounter for surgical aftercare following surgery on the circulatory system: Secondary | ICD-10-CM | POA: Diagnosis not present

## 2017-09-30 DIAGNOSIS — Z953 Presence of xenogenic heart valve: Secondary | ICD-10-CM | POA: Diagnosis not present

## 2017-09-30 DIAGNOSIS — Z952 Presence of prosthetic heart valve: Secondary | ICD-10-CM

## 2017-10-03 ENCOUNTER — Encounter (HOSPITAL_COMMUNITY)
Admission: RE | Admit: 2017-10-03 | Discharge: 2017-10-03 | Disposition: A | Discharge status: Home / Self Care | Payer: Medicare Other | Source: Ambulatory Visit | Attending: Interventional Cardiology | Admitting: Interventional Cardiology

## 2017-10-03 ENCOUNTER — Encounter (HOSPITAL_COMMUNITY): Payer: Medicare Other

## 2017-10-03 DIAGNOSIS — Z953 Presence of xenogenic heart valve: Secondary | ICD-10-CM | POA: Diagnosis not present

## 2017-10-03 DIAGNOSIS — Z48812 Encounter for surgical aftercare following surgery on the circulatory system: Secondary | ICD-10-CM | POA: Diagnosis not present

## 2017-10-03 DIAGNOSIS — Z952 Presence of prosthetic heart valve: Secondary | ICD-10-CM

## 2017-10-05 ENCOUNTER — Encounter (HOSPITAL_COMMUNITY)
Admission: RE | Admit: 2017-10-05 | Discharge: 2017-10-05 | Disposition: A | Discharge status: Home / Self Care | Payer: Medicare Other | Source: Ambulatory Visit | Attending: Interventional Cardiology | Admitting: Interventional Cardiology

## 2017-10-05 ENCOUNTER — Encounter (HOSPITAL_COMMUNITY): Payer: Medicare Other

## 2017-10-05 DIAGNOSIS — Z952 Presence of prosthetic heart valve: Secondary | ICD-10-CM

## 2017-10-05 DIAGNOSIS — Z48812 Encounter for surgical aftercare following surgery on the circulatory system: Secondary | ICD-10-CM | POA: Diagnosis not present

## 2017-10-05 DIAGNOSIS — Z953 Presence of xenogenic heart valve: Secondary | ICD-10-CM | POA: Diagnosis not present

## 2017-10-07 ENCOUNTER — Encounter (HOSPITAL_COMMUNITY)
Admission: RE | Admit: 2017-10-07 | Discharge: 2017-10-07 | Disposition: A | Discharge status: Home / Self Care | Payer: Medicare Other | Source: Ambulatory Visit | Attending: Interventional Cardiology | Admitting: Interventional Cardiology

## 2017-10-07 DIAGNOSIS — Z48812 Encounter for surgical aftercare following surgery on the circulatory system: Secondary | ICD-10-CM | POA: Diagnosis not present

## 2017-10-07 DIAGNOSIS — Z953 Presence of xenogenic heart valve: Secondary | ICD-10-CM | POA: Diagnosis not present

## 2017-10-07 DIAGNOSIS — Z952 Presence of prosthetic heart valve: Secondary | ICD-10-CM

## 2017-10-10 ENCOUNTER — Encounter (HOSPITAL_COMMUNITY): Payer: Self-pay

## 2017-10-10 ENCOUNTER — Encounter (HOSPITAL_COMMUNITY)
Admission: RE | Admit: 2017-10-10 | Discharge: 2017-10-10 | Disposition: A | Discharge status: Home / Self Care | Payer: Medicare Other | Source: Ambulatory Visit | Attending: Interventional Cardiology | Admitting: Interventional Cardiology

## 2017-10-10 DIAGNOSIS — Z953 Presence of xenogenic heart valve: Secondary | ICD-10-CM | POA: Diagnosis not present

## 2017-10-10 DIAGNOSIS — Z48812 Encounter for surgical aftercare following surgery on the circulatory system: Secondary | ICD-10-CM | POA: Diagnosis not present

## 2017-10-10 DIAGNOSIS — Z952 Presence of prosthetic heart valve: Secondary | ICD-10-CM

## 2017-11-01 NOTE — Progress Notes (Signed)
Discharge Progress Report  Patient Details  Name: Darin Simmons MRN: [id number] Date of Birth: [birth year] Referring Provider:     CARDIAC REHAB PHASE II ORIENTATION from 06/28/2017 in [place]  Referring Provider  Larae Grooms, MD.       Number of Visits: .66 Reason for Discharge:  Patient has met program and personal goals.  Smoking History:  Social History   Tobacco Use  Smoking Status Former Smoker  . Packs/day: 1.00  . Years: 50.00  . Pack years: 50.00  . Types: Cigarettes  . Last attempt to quit: 11/22/2008  . Years since quitting: 8.9  Smokeless Tobacco Never Used    Diagnosis:  S/P AVR  ADL UCSD:   Initial Exercise Prescription: Initial Exercise Prescription - 06/28/17 1000      Date of Initial Exercise RX and Referring Provider   Date  06/28/17    Referring Provider  Larae Grooms, MD.      Bike   Level  1.2    Minutes  10    METs  4.48      NuStep   Level  4    SPM  85    Minutes  10    METs  3      Track   Laps  13    Minutes  10    METs  3.26      Prescription Details   Frequency (times per week)  3    Duration  Progress to 30 minutes of continuous aerobic without signs/symptoms of physical distress      Intensity   THRR 40-80% of Max Heartrate  58-117    Ratings of Perceived Exertion  11-13    Perceived Dyspnea  0-4      Progression   Progression  Continue to progress workloads to maintain intensity without signs/symptoms of physical distress.      Resistance Training   Training Prescription  Yes    Weight  3lbs.    Reps  10-15       Discharge Exercise Prescription (Final Exercise Prescription Changes): Exercise Prescription Changes - 10/10/17 1520      Response to Exercise   Blood Pressure (Admit)  136/68    Blood Pressure (Exercise)  140/84    Blood Pressure (Exit)  112/70    Heart Rate (Admit)  66 bpm    Heart Rate (Exercise)  107 bpm    Heart Rate (Exit)  66 bpm    Rating of  Perceived Exertion (Exercise)  13    Duration  Continue with 30 min of aerobic exercise without signs/symptoms of physical distress.    Intensity  THRR unchanged      Progression   Progression  Continue to progress workloads to maintain intensity without signs/symptoms of physical distress.    Average METs  4.2      Resistance Training   Training Prescription  Yes    Weight  5lbs    Reps  10-15    Time  10 Minutes      Bike   Level  1.3    Minutes  15    METs  4.51      NuStep   Level  5    SPM  100    Minutes  15    METs  4.5      Track   Laps  15    Minutes  10    METs  3.6  Home Exercise Plan   Plans to continue exercise at  Home (comment)    Frequency  Add 3 additional days to program exercise sessions.    Initial Home Exercises Provided  07/13/17       Functional Capacity: 6 Minute Walk    Row Name 06/28/17 1018 10/03/17 1439 10/04/17 1646     6 Minute Walk   Phase  Initial  Discharge  Discharge   Distance  2016 feet  2146 feet  -   Distance Feet Change  -  130 ft  -   Walk Time  6 minutes  6 minutes  -   # of Rest Breaks  0  0  -   MPH  3.82  4.06  -   METS  4.61  4.14  -   RPE  12  14  -   VO2 Peak  16.13  -  -   Symptoms  No  No  -   Resting HR  76 bpm  65 bpm  -   Resting BP  112/64  128/60  -   Max Ex. HR  104 bpm  111 bpm  -   Max Ex. BP  150/70  144/62  -   2 Minute Post BP  118/66  -  -   Row Name 10/12/17 1535         6 Minute Walk   Distance % Change  6.45 %     2 Minute Post BP  110/60        Psychological, QOL, Others - Outcomes: PHQ 2/9: Depression screen [facility] 2/9 10/10/2017 07/04/2017 06/21/2017  Decreased Interest 0 0 0  Down, Depressed, Hopeless 0 1 1  PHQ - 2 Score 0 1 1    Quality of Life: Quality of Life - 10/12/17 1530      Quality of Life Scores   Family Pre  22.5 %    Family Post  21.33 %    Family % Change  -5.2 %    GLOBAL Pre  22.32 %    GLOBAL Post  23 %    GLOBAL % Change  3.05 %       Personal  Goals: Goals established at orientation with interventions provided to work toward goal. Personal Goals and Risk Factors at Admission - 06/28/17 1059      Core Components/Risk Factors/Patient Goals on Admission   Lipids  Yes    Intervention  Provide education and support for participant on nutrition & aerobic/resistive exercise along with prescribed medications to achieve LDL <50m, HDL >464m    Expected Outcomes  Long Term: Cholesterol controlled with medications as prescribed, with individualized exercise RX and with personalized nutrition plan. Value goals: LDL < 7060mHDL > 40 mg.;Short Term: Participant states understanding of desired cholesterol values and is compliant with medications prescribed. Participant is following exercise prescription and nutrition guidelines.    Stress  Yes    Intervention  Offer individual and/or small group education and counseling on adjustment to heart disease, stress management and health-related lifestyle change. Teach and support self-help strategies.;Refer participants experiencing significant psychosocial distress to appropriate mental health specialists for further evaluation and treatment. When possible, include family members and significant others in education/counseling sessions.    Expected Outcomes  Long Term: Emotional wellbeing is indicated by absence of clinically significant psychosocial distress or social isolation.;Short Term: Participant demonstrates changes in health-related behavior, relaxation and other stress management skills, ability to obtain effective social support, and  compliance with psychotropic medications if prescribed.    Personal Goal Other  Yes    Personal Goal  Have energy to travel. Have energy to organize house and work in garden.    Intervention  Provide aerobic exercise prgram to help increase strength and stamina, increase in energy expenditure as evidenced by increasing MET level, and increase functional capacity as  evidenced by 6-minute walk test.    Expected Outcomes  Patient will have sufficient energy to travel and take trip to [place] in September 2018. Patient will have sufficient energy to do household chores including organizing household items and garden.        Personal Goals Discharge: Goals and Risk Factor Review    Row Name 07/06/17 1737 07/29/17 1646 08/24/17 1026 09/20/17 0801 09/23/17 1044     Core Components/Risk Factors/Patient Goals Review   Personal Goals Review  Stress;Lipids;Other  Stress;Lipids;Other  Stress;Lipids;Other  Develop more efficient breathing techniques such as purse lipped breathing and diaphragmatic breathing and practicing self-pacing with activity.  Develop more efficient breathing techniques such as purse lipped breathing and diaphragmatic breathing and practicing self-pacing with activity.   Review  pt with few CAD risk factors.  pt is looking forward to increased strength/stamina and energy to be able to visit [place] in a few months. pt seems eager to participate in CR activities  pt with few CAD risk factors.  pt is looking forward to increased strength/stamina and energy to be able to visit [place] this week. pt has made appropriate travel arrangements and is looking forward to his trip.  pt seems eager to participate in CR activities and enthusiastically interacts with his peers.     pt with few CAD risk factors.  pt tolerated [place] trip well with exception of experiencing dizziness.  Symptoms being evaluated by cardiology.  pt seems eager to participate in CR activities and enthusiastically interacts with his peers.    pt with few CAD risk factors.  pt seems eager to participate in CR activities and enthusiastically interacts with his peers.   pt reports decreased dizziness and dyspnea on exertion.     Expected Outcomes  pt will participate in CR exercise, nutrition and education opportunities to decrease overall RF.    pt will participate in CR exercise, nutrition  and education opportunities to decrease overall RF.    pt will participate in CR exercise, nutrition and education opportunities to decrease overall RF.    pt will participate in CR exercise, nutrition and education opportunities to decrease overall RF.    pt will participate in CR exercise, nutrition and education opportunities to decrease overall RF.     [place] Name 10/10/17 1648             Core Components/Risk Factors/Patient Goals Review   Review  pt reports improvement in functional ability and symptoms. pt is pleased to be able to work in his garden without symptoms.  pt plans to continue exercising on his own by using home nustep and cardiac maintenance program.  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         Expected Outcomes  pt will participate in  exercise, nutrition and education opportunities to decrease overall RF.            Exercise Goals and Review: Exercise Goals    Row Name 06/28/17 1024             Exercise Goals   Increase Physical Activity  Yes  Intervention  Provide advice, education, support and counseling about physical activity/exercise needs.;Develop an individualized exercise prescription for aerobic and resistive training based on initial evaluation findings, risk stratification, comorbidities and participant's personal goals.       Expected Outcomes  Achievement of increased cardiorespiratory fitness and enhanced flexibility, muscular endurance and strength shown through measurements of functional capacity and personal statement of participant.       Increase Strength and Stamina  Yes       Intervention  Provide advice, education, support and counseling about physical activity/exercise needs.;Develop an individualized exercise prescription for aerobic and resistive training based on initial evaluation findings, risk stratification, comorbidities and participant's personal goals.       Expected Outcomes  Achievement of  increased cardiorespiratory fitness and enhanced flexibility, muscular endurance and strength shown through measurements of functional capacity and personal statement of participant.          Nutrition & Weight - Outcomes: Pre Biometrics - 10/04/17 1647      Pre Biometrics   Height  _0  (1.778 m)    Weight  156 lb 8.4 oz (71 kg)    Waist Circumference  35 inches    Hip Circumference  36 inches    Waist to Hip Ratio  0.97 %    BMI (Calculated)  22.46    Triceps Skinfold  11 mm    % Body Fat  22.1 %    Grip Strength  35 kg    Flexibility  11 in    Single Leg Stand  30 seconds        Nutrition: Nutrition Therapy & Goals - 06/28/17 1412      Nutrition Therapy   Diet  Therapeutic Lifestyle Changes      Personal Nutrition Goals   Nutrition Goal  Pt to describe the benefit of including fruits, vegetables, whole grains, and low-fat dairy products in a heart healthy meal plan.      Intervention Plan   Intervention  Prescribe, educate and counsel regarding individualized specific dietary modifications aiming towards targeted core components such as weight, hypertension, lipid management, diabetes, heart failure and other comorbidities.    Expected Outcomes  Short Term Goal: Understand basic principles of dietary content, such as calories, fat, sodium, cholesterol and nutrients.;Long Term Goal: Adherence to prescribed nutrition plan.       Nutrition Discharge: Nutrition Assessments - 10/19/17 1042      MEDFICTS Scores   Pre Score  68    Post Score  37    Score Difference  -31       Education Questionnaire Score: Knowledge Questionnaire Score - 10/12/17 1528      Knowledge Questionnaire Score   Post Score  22/24       Goals reviewed with patient; copy given to patient.

## 2017-11-16 ENCOUNTER — Encounter (HOSPITAL_COMMUNITY)
Admission: RE | Admit: 2017-11-16 | Discharge: 2017-11-16 | Disposition: A | Discharge status: Home / Self Care | Payer: Self-pay | Source: Ambulatory Visit | Attending: Interventional Cardiology | Admitting: Interventional Cardiology

## 2017-11-16 DIAGNOSIS — Z953 Presence of xenogenic heart valve: Secondary | ICD-10-CM | POA: Insufficient documentation

## 2017-11-17 ENCOUNTER — Encounter (HOSPITAL_COMMUNITY)
Admission: RE | Admit: 2017-11-17 | Discharge: 2017-11-17 | Disposition: A | Discharge status: Home / Self Care | Payer: Self-pay | Source: Ambulatory Visit | Attending: Interventional Cardiology | Admitting: Interventional Cardiology

## 2017-11-18 ENCOUNTER — Encounter (HOSPITAL_COMMUNITY)
Admission: RE | Admit: 2017-11-18 | Discharge: 2017-11-18 | Disposition: A | Discharge status: Home / Self Care | Payer: Self-pay | Source: Ambulatory Visit | Attending: Interventional Cardiology | Admitting: Interventional Cardiology

## 2017-11-21 ENCOUNTER — Encounter (HOSPITAL_COMMUNITY)
Admission: RE | Admit: 2017-11-21 | Discharge: 2017-11-21 | Disposition: A | Discharge status: Home / Self Care | Payer: Self-pay | Source: Ambulatory Visit | Attending: Interventional Cardiology | Admitting: Interventional Cardiology

## 2017-11-24 ENCOUNTER — Encounter (HOSPITAL_COMMUNITY)
Admission: RE | Admit: 2017-11-24 | Discharge: 2017-11-24 | Disposition: A | Discharge status: Home / Self Care | Payer: Self-pay | Source: Ambulatory Visit | Attending: Interventional Cardiology | Admitting: Interventional Cardiology

## 2017-11-24 DIAGNOSIS — Z953 Presence of xenogenic heart valve: Secondary | ICD-10-CM | POA: Insufficient documentation

## 2017-11-25 ENCOUNTER — Encounter (HOSPITAL_COMMUNITY)
Admission: RE | Admit: 2017-11-25 | Discharge: 2017-11-25 | Disposition: A | Discharge status: Home / Self Care | Payer: Self-pay | Source: Ambulatory Visit | Attending: Interventional Cardiology | Admitting: Interventional Cardiology

## 2017-11-28 ENCOUNTER — Encounter (HOSPITAL_COMMUNITY)
Admission: RE | Admit: 2017-11-28 | Discharge: 2017-11-28 | Disposition: A | Discharge status: Home / Self Care | Payer: Self-pay | Source: Ambulatory Visit | Attending: Interventional Cardiology | Admitting: Interventional Cardiology

## 2017-11-29 ENCOUNTER — Encounter (HOSPITAL_COMMUNITY): Payer: Self-pay

## 2017-12-01 ENCOUNTER — Encounter (HOSPITAL_COMMUNITY): Payer: Self-pay

## 2017-12-02 ENCOUNTER — Encounter (HOSPITAL_COMMUNITY): Payer: Self-pay

## 2017-12-06 ENCOUNTER — Encounter (HOSPITAL_COMMUNITY): Payer: Self-pay

## 2017-12-08 ENCOUNTER — Encounter (HOSPITAL_COMMUNITY): Payer: Self-pay

## 2017-12-09 ENCOUNTER — Encounter (HOSPITAL_COMMUNITY)
Admission: RE | Admit: 2017-12-09 | Discharge: 2017-12-09 | Disposition: A | Discharge status: Home / Self Care | Payer: Self-pay | Source: Ambulatory Visit | Attending: Interventional Cardiology | Admitting: Interventional Cardiology

## 2017-12-13 ENCOUNTER — Encounter (HOSPITAL_COMMUNITY): Payer: Self-pay

## 2017-12-15 ENCOUNTER — Encounter (HOSPITAL_COMMUNITY)
Admission: RE | Admit: 2017-12-15 | Discharge: 2017-12-15 | Disposition: A | Discharge status: Home / Self Care | Payer: Self-pay | Source: Ambulatory Visit | Attending: Interventional Cardiology | Admitting: Interventional Cardiology

## 2017-12-16 ENCOUNTER — Encounter (HOSPITAL_COMMUNITY)
Admission: RE | Admit: 2017-12-16 | Discharge: 2017-12-16 | Disposition: A | Discharge status: Home / Self Care | Payer: Self-pay | Source: Ambulatory Visit | Attending: Interventional Cardiology | Admitting: Interventional Cardiology

## 2017-12-19 ENCOUNTER — Encounter (HOSPITAL_COMMUNITY)
Admission: RE | Admit: 2017-12-19 | Discharge: 2017-12-19 | Disposition: A | Discharge status: Home / Self Care | Payer: Self-pay | Source: Ambulatory Visit | Attending: Interventional Cardiology | Admitting: Interventional Cardiology

## 2017-12-20 ENCOUNTER — Encounter (HOSPITAL_COMMUNITY): Payer: Self-pay

## 2017-12-22 ENCOUNTER — Encounter (HOSPITAL_COMMUNITY)
Admission: RE | Admit: 2017-12-22 | Discharge: 2017-12-22 | Disposition: A | Discharge status: Home / Self Care | Payer: Self-pay | Source: Ambulatory Visit | Attending: Interventional Cardiology | Admitting: Interventional Cardiology

## 2017-12-23 ENCOUNTER — Encounter (HOSPITAL_COMMUNITY)
Admission: RE | Admit: 2017-12-23 | Discharge: 2017-12-23 | Disposition: A | Discharge status: Home / Self Care | Payer: Self-pay | Source: Ambulatory Visit | Attending: Interventional Cardiology | Admitting: Interventional Cardiology

## 2017-12-23 DIAGNOSIS — Z953 Presence of xenogenic heart valve: Secondary | ICD-10-CM | POA: Insufficient documentation

## 2017-12-26 ENCOUNTER — Encounter (HOSPITAL_COMMUNITY)
Admission: RE | Admit: 2017-12-26 | Discharge: 2017-12-26 | Disposition: A | Discharge status: Home / Self Care | Payer: Self-pay | Source: Ambulatory Visit | Attending: Interventional Cardiology | Admitting: Interventional Cardiology

## 2017-12-27 ENCOUNTER — Encounter (HOSPITAL_COMMUNITY): Payer: Self-pay

## 2017-12-29 ENCOUNTER — Encounter (HOSPITAL_COMMUNITY)
Admission: RE | Admit: 2017-12-29 | Discharge: 2017-12-29 | Disposition: A | Discharge status: Home / Self Care | Payer: Self-pay | Source: Ambulatory Visit | Attending: Interventional Cardiology | Admitting: Interventional Cardiology

## 2017-12-30 ENCOUNTER — Encounter (HOSPITAL_COMMUNITY)
Admission: RE | Admit: 2017-12-30 | Discharge: 2017-12-30 | Disposition: A | Discharge status: Home / Self Care | Payer: Self-pay | Source: Ambulatory Visit | Attending: Interventional Cardiology | Admitting: Interventional Cardiology

## 2018-01-02 ENCOUNTER — Encounter (HOSPITAL_COMMUNITY)
Admission: RE | Admit: 2018-01-02 | Discharge: 2018-01-02 | Disposition: A | Discharge status: Home / Self Care | Payer: Self-pay | Source: Ambulatory Visit | Attending: Interventional Cardiology | Admitting: Interventional Cardiology

## 2018-01-03 ENCOUNTER — Encounter (HOSPITAL_COMMUNITY): Payer: Self-pay

## 2018-01-05 ENCOUNTER — Encounter (HOSPITAL_COMMUNITY)
Admission: RE | Admit: 2018-01-05 | Discharge: 2018-01-05 | Disposition: A | Discharge status: Home / Self Care | Payer: Self-pay | Source: Ambulatory Visit | Attending: Interventional Cardiology | Admitting: Interventional Cardiology

## 2018-01-06 ENCOUNTER — Encounter (HOSPITAL_COMMUNITY)
Admission: RE | Admit: 2018-01-06 | Discharge: 2018-01-06 | Disposition: A | Discharge status: Home / Self Care | Payer: Self-pay | Source: Ambulatory Visit | Attending: Interventional Cardiology | Admitting: Interventional Cardiology

## 2018-01-10 ENCOUNTER — Encounter (HOSPITAL_COMMUNITY): Payer: Self-pay

## 2018-01-11 IMAGING — CT CT CHEST W/ CM
2 of 7 series · 14 of 36 positions shown, 18 images · IV contrast (iopamidol)
Comparison: 04/06/2017 chest CT

CLINICAL DATA: 75 y/o M; aortic valve replacement 04/22/2017.
Sternal wound infection with soreness in the sternum.

EXAM:
CT CHEST WITH CONTRAST
TECHNIQUE: Multidetector CT imaging of the chest was performed during
intravenous contrast administration.
CONTRAST:  75mL WHWJKC-J99 IOPAMIDOL (WHWJKC-J99) INJECTION 61%

[Series 3: thorroutine 2.0 i31s 1 · axial · 0.68mm/px · z∈[+1036,+1290]mm · 13 of 145 slices shown, 17 images]
[im 9/145  mediastinal]
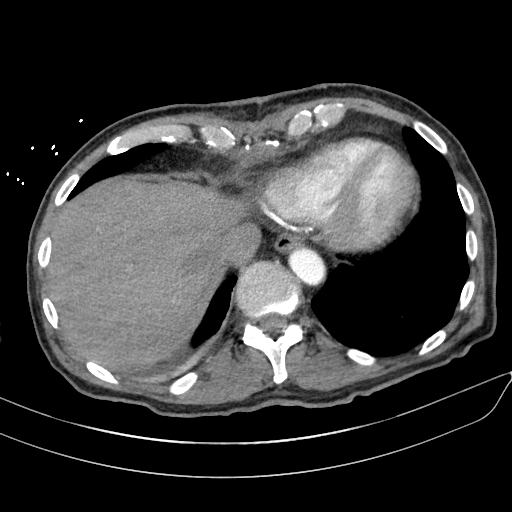
[im 9/145  lung]
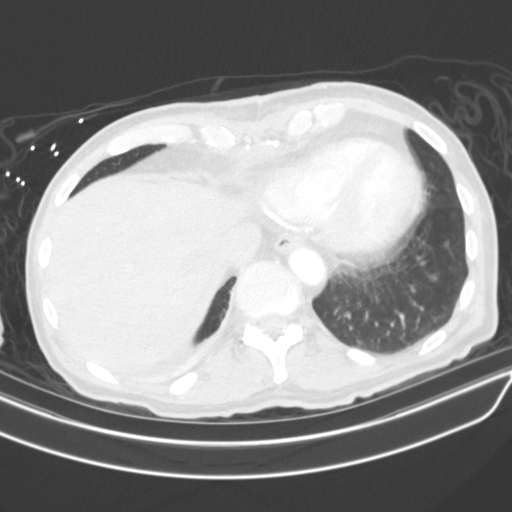
[im 17/145  lung]
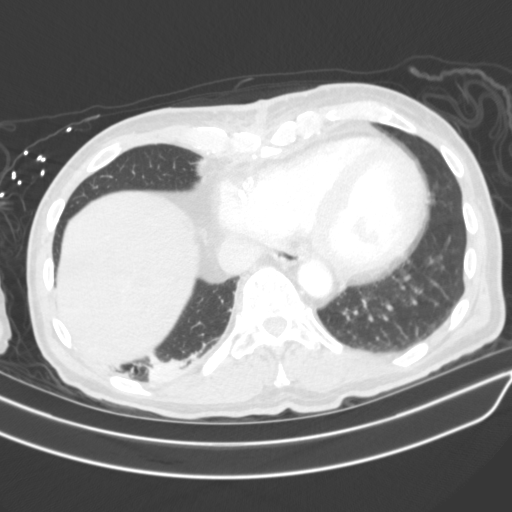
[im 34/145  lung]
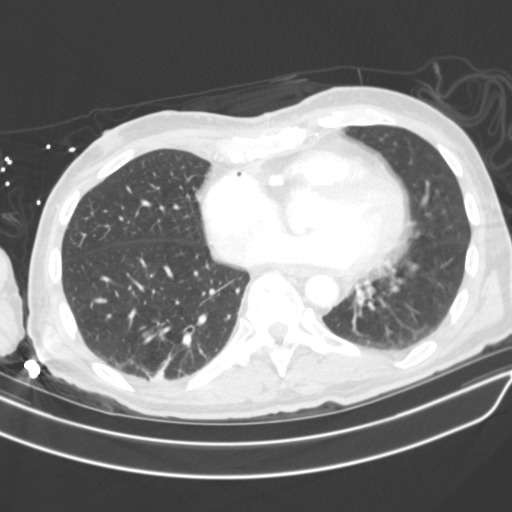
[im 43/145  lung]
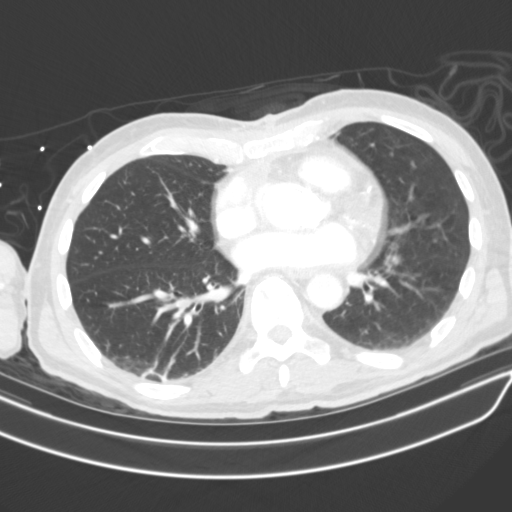
[im 51/145  mediastinal]
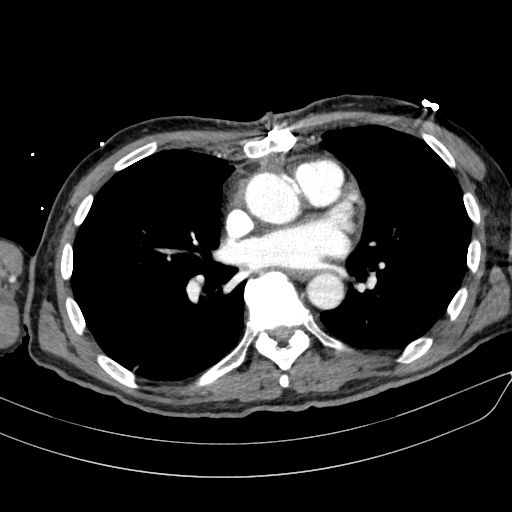
[im 51/145  lung]
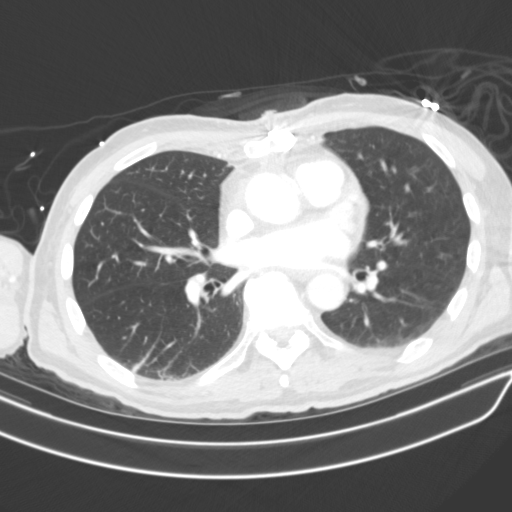
[im 60/145  lung]
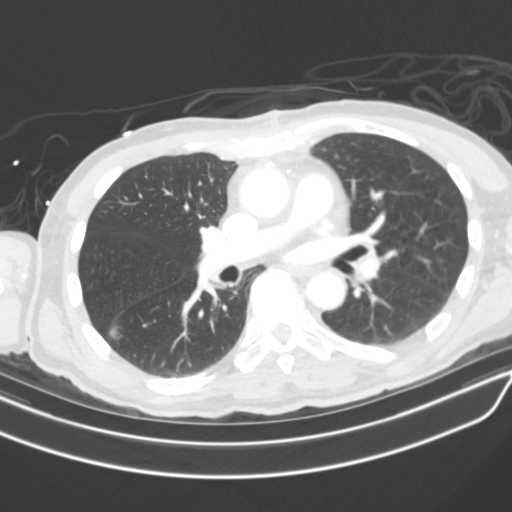
[im 77/145  lung]
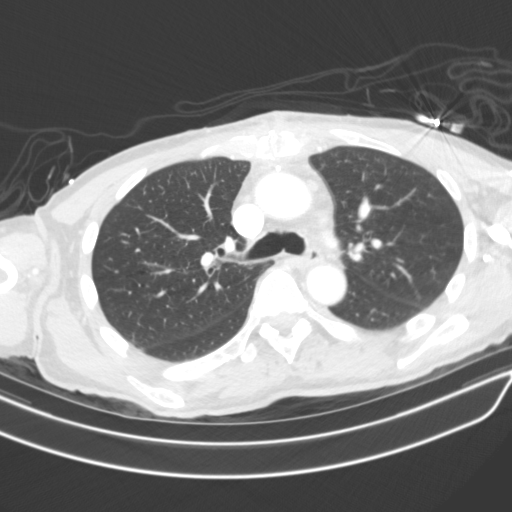
[im 85/145  lung]
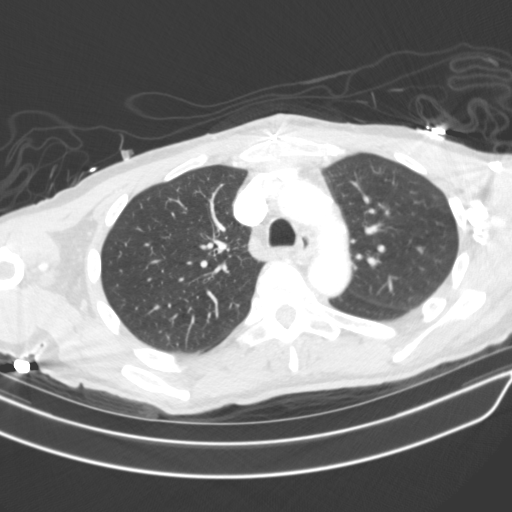
[im 94/145  mediastinal]
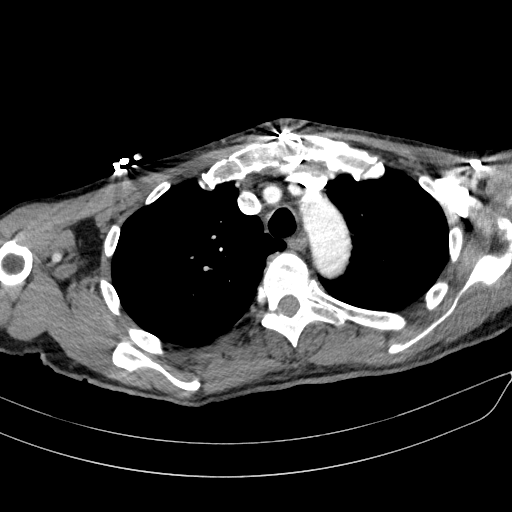
[im 94/145  lung]
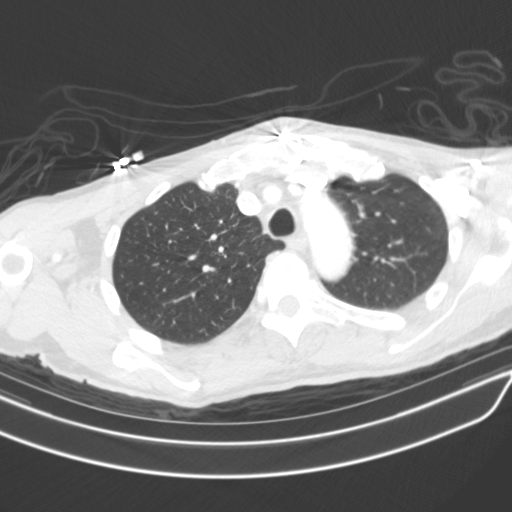
[im 102/145  lung]
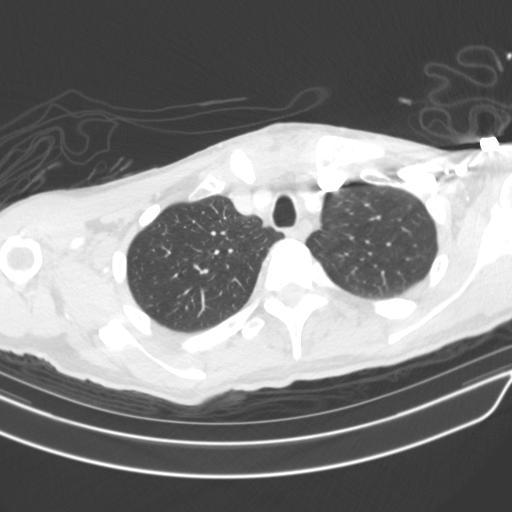
[im 111/145  lung]
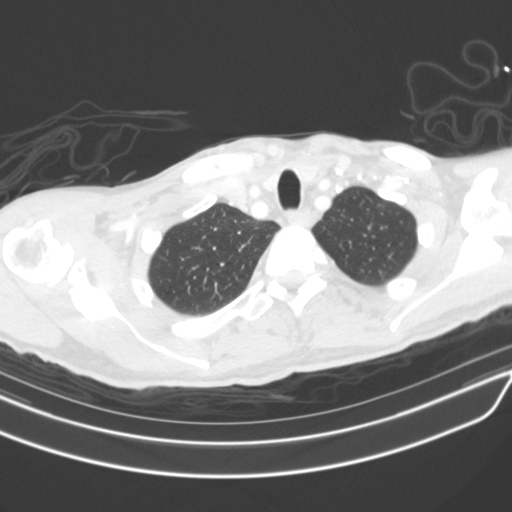
[im 128/145  lung]
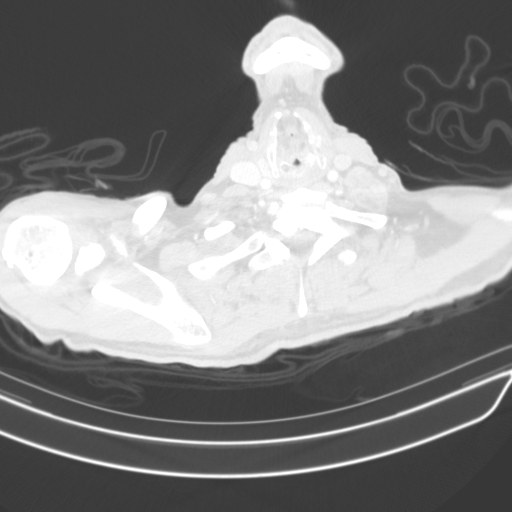
[im 136/145  mediastinal]
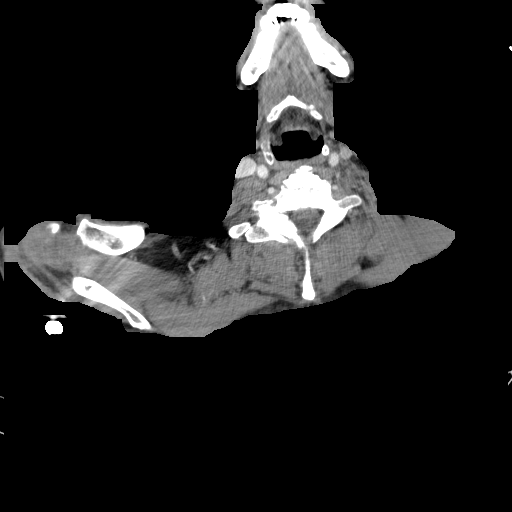
[im 136/145  lung]
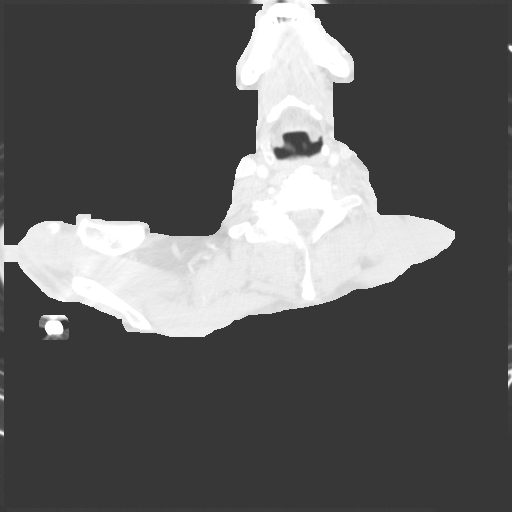

[Series 5: thorroutine 3.0 mpr cor · coronal · 0.59mm/px · 1 of 68 slices shown]
[im 34/68  lung]
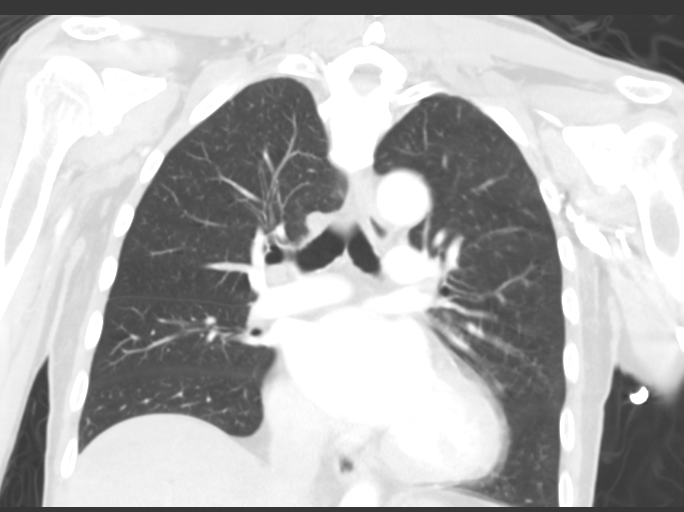

[14 of 36 positions shown; findings below may reference images not displayed]

FINDINGS: Cardiovascular: Stable heart size. Moderate to severe coronary
artery calcification. RCA stent. Aortic valve replacement. Normal
caliber aorta with mild calcific atherosclerosis.

Mediastinum/Nodes: No lymphadenopathy. Normal thoracic esophagus.
Unremarkable thyroid gland.

Lungs/Pleura: Right lung base dependent platelike atelectasis. Mild
centrilobular emphysema in the lung apices. No consolidation or
pneumothorax. Trace right pleural effusion.

Upper Abdomen: Nonspecific subcentimeter calcification within
segment 8 of the liver and tiny hypodense foci probably representing
cysts.

Musculoskeletal: Approximately 6 mm of dehiscence of the manubrium.
The sternum is well apposed. Ill-defined lucency of the bony margins
of the manubrium as well as of the superior aspect of the sternum
(series 5, image 12). Sternotomy wires are intact. There is soft
tissue thickening posterior to the sternotomy without a discrete rim
enhancing collection probably representing postsurgical changes. No
definite evidence for mediastinum disc. Fat stranding within the
chest wall anterior to the sternum and manubrium extending
inferiorly into the subxiphoid region, no discrete collection.
IMPRESSION: 1. 6 mm dehiscence of the manubrium.  Well opposed sternum.
2. Ill-defined lucency of bony margins of the manubrium as well as
superior aspect of sternum may represent osteomyelitis in setting of
infection.
3. Mild fat stranding in the chest wall anterior to the sternum and
manubrium extending in the subxiphoid region may be related to
infection and/or postsurgical changes, no discrete abscess
identified.
4. Ill-defined soft tissue subjacent to the sternum in the anterior
mediastinum without discrete fluid collection probably representing
postsurgical changes.
5. Trace right pleural effusion.

By: Ayush Santa M.D.

## 2018-01-12 ENCOUNTER — Encounter (HOSPITAL_COMMUNITY): Payer: Self-pay

## 2018-01-13 ENCOUNTER — Encounter (HOSPITAL_COMMUNITY): Payer: Self-pay

## 2018-01-17 ENCOUNTER — Encounter (HOSPITAL_COMMUNITY): Payer: Self-pay

## 2018-01-19 ENCOUNTER — Encounter (HOSPITAL_COMMUNITY): Payer: Self-pay

## 2018-01-20 ENCOUNTER — Encounter (HOSPITAL_COMMUNITY): Payer: Self-pay

## 2018-01-20 DIAGNOSIS — Z953 Presence of xenogenic heart valve: Secondary | ICD-10-CM | POA: Insufficient documentation

## 2018-01-23 ENCOUNTER — Encounter (HOSPITAL_COMMUNITY)
Admission: RE | Admit: 2018-01-23 | Discharge: 2018-01-23 | Disposition: A | Discharge status: Home / Self Care | Payer: Self-pay | Source: Ambulatory Visit | Attending: Interventional Cardiology | Admitting: Interventional Cardiology

## 2018-01-24 ENCOUNTER — Encounter (HOSPITAL_COMMUNITY): Payer: Self-pay

## 2018-01-26 ENCOUNTER — Encounter (HOSPITAL_COMMUNITY)
Admission: RE | Admit: 2018-01-26 | Discharge: 2018-01-26 | Disposition: A | Discharge status: Home / Self Care | Payer: Self-pay | Source: Ambulatory Visit | Attending: Interventional Cardiology | Admitting: Interventional Cardiology

## 2018-01-27 ENCOUNTER — Encounter (HOSPITAL_COMMUNITY)
Admission: RE | Admit: 2018-01-27 | Discharge: 2018-01-27 | Disposition: A | Discharge status: Home / Self Care | Payer: Self-pay | Source: Ambulatory Visit | Attending: Interventional Cardiology | Admitting: Interventional Cardiology

## 2018-01-30 ENCOUNTER — Encounter (HOSPITAL_COMMUNITY)
Admission: RE | Admit: 2018-01-30 | Discharge: 2018-01-30 | Disposition: A | Discharge status: Home / Self Care | Payer: Self-pay | Source: Ambulatory Visit | Attending: Interventional Cardiology | Admitting: Interventional Cardiology

## 2018-01-31 ENCOUNTER — Encounter (HOSPITAL_COMMUNITY): Payer: Self-pay

## 2018-02-02 ENCOUNTER — Encounter (HOSPITAL_COMMUNITY)
Admission: RE | Admit: 2018-02-02 | Discharge: 2018-02-02 | Disposition: A | Discharge status: Home / Self Care | Payer: Self-pay | Source: Ambulatory Visit | Attending: Interventional Cardiology | Admitting: Interventional Cardiology

## 2018-02-03 ENCOUNTER — Encounter (HOSPITAL_COMMUNITY)
Admission: RE | Admit: 2018-02-03 | Discharge: 2018-02-03 | Disposition: A | Discharge status: Home / Self Care | Payer: Self-pay | Source: Ambulatory Visit | Attending: Interventional Cardiology | Admitting: Interventional Cardiology

## 2018-02-07 ENCOUNTER — Encounter (HOSPITAL_COMMUNITY): Payer: Self-pay

## 2018-02-08 DIAGNOSIS — R109 Unspecified abdominal pain: Secondary | ICD-10-CM | POA: Diagnosis not present

## 2018-02-08 DIAGNOSIS — Z1211 Encounter for screening for malignant neoplasm of colon: Secondary | ICD-10-CM | POA: Diagnosis not present

## 2018-02-09 ENCOUNTER — Encounter (HOSPITAL_COMMUNITY): Payer: Self-pay

## 2018-02-09 ENCOUNTER — Telehealth (HOSPITAL_COMMUNITY): Payer: Self-pay | Admitting: Family Medicine

## 2018-02-10 ENCOUNTER — Encounter (HOSPITAL_COMMUNITY): Payer: Self-pay

## 2018-02-10 ENCOUNTER — Other Ambulatory Visit: Payer: Self-pay | Admitting: Gastroenterology

## 2018-02-10 DIAGNOSIS — R109 Unspecified abdominal pain: Secondary | ICD-10-CM

## 2018-02-14 ENCOUNTER — Encounter (HOSPITAL_COMMUNITY): Payer: Self-pay

## 2018-02-16 ENCOUNTER — Encounter (HOSPITAL_COMMUNITY): Payer: Self-pay

## 2018-02-17 ENCOUNTER — Encounter (HOSPITAL_COMMUNITY): Payer: Self-pay

## 2018-02-20 ENCOUNTER — Ambulatory Visit
Admission: RE | Admit: 2018-02-20 | Discharge: 2018-02-20 | Disposition: A | Discharge status: Home / Self Care | Payer: Medicare Other | Source: Ambulatory Visit | Attending: Gastroenterology | Admitting: Gastroenterology

## 2018-02-20 DIAGNOSIS — R109 Unspecified abdominal pain: Secondary | ICD-10-CM

## 2018-02-21 ENCOUNTER — Encounter (HOSPITAL_COMMUNITY): Payer: Self-pay | Attending: Interventional Cardiology

## 2018-02-21 DIAGNOSIS — Z1211 Encounter for screening for malignant neoplasm of colon: Secondary | ICD-10-CM | POA: Diagnosis not present

## 2018-02-21 DIAGNOSIS — Z953 Presence of xenogenic heart valve: Secondary | ICD-10-CM | POA: Insufficient documentation

## 2018-02-23 ENCOUNTER — Encounter (HOSPITAL_COMMUNITY): Payer: Self-pay

## 2018-02-24 ENCOUNTER — Encounter (HOSPITAL_COMMUNITY): Payer: Self-pay

## 2018-02-28 ENCOUNTER — Encounter (HOSPITAL_COMMUNITY): Payer: Self-pay

## 2018-03-02 ENCOUNTER — Encounter (HOSPITAL_COMMUNITY): Payer: Self-pay

## 2018-03-02 DIAGNOSIS — Z0000 Encounter for general adult medical examination without abnormal findings: Secondary | ICD-10-CM | POA: Diagnosis not present

## 2018-03-02 DIAGNOSIS — Z125 Encounter for screening for malignant neoplasm of prostate: Secondary | ICD-10-CM | POA: Diagnosis not present

## 2018-03-02 DIAGNOSIS — I739 Peripheral vascular disease, unspecified: Secondary | ICD-10-CM | POA: Diagnosis not present

## 2018-03-02 DIAGNOSIS — E7800 Pure hypercholesterolemia, unspecified: Secondary | ICD-10-CM | POA: Diagnosis not present

## 2018-03-02 DIAGNOSIS — R109 Unspecified abdominal pain: Secondary | ICD-10-CM | POA: Diagnosis not present

## 2018-03-02 DIAGNOSIS — I2510 Atherosclerotic heart disease of native coronary artery without angina pectoris: Secondary | ICD-10-CM | POA: Diagnosis not present

## 2018-03-02 DIAGNOSIS — I350 Nonrheumatic aortic (valve) stenosis: Secondary | ICD-10-CM | POA: Diagnosis not present

## 2018-03-02 DIAGNOSIS — Z5181 Encounter for therapeutic drug level monitoring: Secondary | ICD-10-CM | POA: Diagnosis not present

## 2018-03-02 DIAGNOSIS — Z952 Presence of prosthetic heart valve: Secondary | ICD-10-CM | POA: Diagnosis not present

## 2018-03-03 ENCOUNTER — Encounter (HOSPITAL_COMMUNITY): Payer: Self-pay

## 2018-03-07 ENCOUNTER — Encounter (HOSPITAL_COMMUNITY): Payer: Self-pay

## 2018-03-09 ENCOUNTER — Encounter (HOSPITAL_COMMUNITY): Payer: Self-pay

## 2018-03-10 ENCOUNTER — Encounter (HOSPITAL_COMMUNITY): Payer: Self-pay

## 2018-03-10 ENCOUNTER — Telehealth: Payer: Self-pay | Admitting: Interventional Cardiology

## 2018-03-10 NOTE — Telephone Encounter (Signed)
-----   Message from Jettie Booze, MD sent at 03/07/2018 12:33 PM EDT ----- LDL above target.  Would increase pravastatin to 40 mg daily.  REcheck liver and lipids in 3 months.

## 2018-03-10 NOTE — Telephone Encounter (Signed)
Patient calling, states that he is returning your call. Patient states that you may leave a detailed message if you cant reach him

## 2018-03-10 NOTE — Telephone Encounter (Signed)
Attempted to return call to patient, but it just kept ringing with no answer and no VM. Will try again later.

## 2018-03-14 ENCOUNTER — Encounter (HOSPITAL_COMMUNITY): Payer: Self-pay

## 2018-03-14 NOTE — Telephone Encounter (Signed)
Left message for patient to call back  

## 2018-03-14 NOTE — Telephone Encounter (Signed)
Made patient aware that his LDL was above goal at 133. Made him aware of Dr. Hassell Done recommendations to increase his pravastatin to 40 mg QD and repeat labs in 3 months. Patient refuses to increase his pravastatin. Patient states that he has done a lot of research and he stopped he has already stopped his plavix and states that he will not increase his statin medicine and that he is going to continue taking his systemic enzymes. He states that the systemic enzymes that he takes are superior to any of these other medicines and that it has helped save his toes. Educated the patient on the rationale and importance of controlling his cholesterol and trying to keep his LDL within goal to reduce the risk of cardiac event or stroke. Patient states that he is going to stick to his systemic enzymes.

## 2018-03-16 ENCOUNTER — Encounter (HOSPITAL_COMMUNITY): Payer: Self-pay

## 2018-03-17 ENCOUNTER — Encounter (HOSPITAL_COMMUNITY): Payer: Self-pay

## 2018-03-21 ENCOUNTER — Encounter (HOSPITAL_COMMUNITY): Payer: Self-pay

## 2018-03-23 ENCOUNTER — Encounter (HOSPITAL_COMMUNITY): Payer: Self-pay

## 2018-03-24 ENCOUNTER — Encounter (HOSPITAL_COMMUNITY): Payer: Self-pay

## 2018-03-28 ENCOUNTER — Encounter (HOSPITAL_COMMUNITY): Payer: Self-pay

## 2018-03-30 ENCOUNTER — Encounter (HOSPITAL_COMMUNITY): Payer: Self-pay

## 2018-03-31 ENCOUNTER — Encounter (HOSPITAL_COMMUNITY): Payer: Self-pay

## 2018-04-04 ENCOUNTER — Encounter (HOSPITAL_COMMUNITY): Payer: Self-pay

## 2018-04-06 ENCOUNTER — Encounter (HOSPITAL_COMMUNITY): Payer: Self-pay

## 2018-04-07 ENCOUNTER — Encounter (HOSPITAL_COMMUNITY): Payer: Self-pay

## 2018-04-10 DIAGNOSIS — I350 Nonrheumatic aortic (valve) stenosis: Secondary | ICD-10-CM | POA: Diagnosis not present

## 2018-04-10 DIAGNOSIS — G479 Sleep disorder, unspecified: Secondary | ICD-10-CM | POA: Diagnosis not present

## 2018-04-10 DIAGNOSIS — Z1211 Encounter for screening for malignant neoplasm of colon: Secondary | ICD-10-CM | POA: Diagnosis not present

## 2018-04-10 DIAGNOSIS — R109 Unspecified abdominal pain: Secondary | ICD-10-CM | POA: Diagnosis not present

## 2018-04-11 ENCOUNTER — Encounter (HOSPITAL_COMMUNITY): Payer: Self-pay

## 2018-04-13 ENCOUNTER — Encounter (HOSPITAL_COMMUNITY): Payer: Self-pay

## 2018-04-14 ENCOUNTER — Encounter (HOSPITAL_COMMUNITY): Payer: Self-pay

## 2018-04-18 ENCOUNTER — Encounter (HOSPITAL_COMMUNITY): Payer: Self-pay

## 2018-04-20 ENCOUNTER — Encounter (HOSPITAL_COMMUNITY): Payer: Self-pay

## 2018-04-21 ENCOUNTER — Encounter (HOSPITAL_COMMUNITY): Payer: Self-pay

## 2018-04-25 ENCOUNTER — Encounter (HOSPITAL_COMMUNITY): Payer: Self-pay

## 2018-04-27 ENCOUNTER — Encounter (HOSPITAL_COMMUNITY): Payer: Self-pay

## 2018-04-28 ENCOUNTER — Encounter (HOSPITAL_COMMUNITY): Payer: Self-pay

## 2018-05-02 ENCOUNTER — Encounter (HOSPITAL_COMMUNITY): Payer: Self-pay

## 2018-05-04 ENCOUNTER — Encounter (HOSPITAL_COMMUNITY): Payer: Self-pay

## 2018-05-05 ENCOUNTER — Encounter (HOSPITAL_COMMUNITY): Payer: Self-pay

## 2018-05-09 ENCOUNTER — Encounter (HOSPITAL_COMMUNITY): Payer: Self-pay

## 2018-05-11 ENCOUNTER — Encounter (HOSPITAL_COMMUNITY): Payer: Self-pay

## 2018-05-12 ENCOUNTER — Encounter (HOSPITAL_COMMUNITY): Payer: Self-pay

## 2018-05-16 ENCOUNTER — Encounter (HOSPITAL_COMMUNITY): Payer: Self-pay

## 2018-05-18 ENCOUNTER — Encounter (HOSPITAL_COMMUNITY): Payer: Self-pay

## 2018-05-19 ENCOUNTER — Encounter (HOSPITAL_COMMUNITY): Payer: Self-pay

## 2018-08-23 ENCOUNTER — Other Ambulatory Visit: Payer: Self-pay | Admitting: Interventional Cardiology

## 2018-08-23 NOTE — Telephone Encounter (Signed)
Pt's pharmacy is requesting a refill on amoxicillin. Would Dr. Varanasi like to refill this medication? Please address 

## 2018-08-23 NOTE — Telephone Encounter (Signed)
Refill sent for SBE prophylaxis

## 2018-09-04 DIAGNOSIS — E7800 Pure hypercholesterolemia, unspecified: Secondary | ICD-10-CM | POA: Diagnosis not present

## 2018-09-04 DIAGNOSIS — Z23 Encounter for immunization: Secondary | ICD-10-CM | POA: Diagnosis not present

## 2018-09-04 DIAGNOSIS — I2510 Atherosclerotic heart disease of native coronary artery without angina pectoris: Secondary | ICD-10-CM | POA: Diagnosis not present

## 2018-11-08 NOTE — Progress Notes (Signed)
Cardiology Office Note   Date:  11/09/2018   ID:  Ugonna, Keirsey 01/07/1941, MRN [id number]  PCP:  Darcus Austin, MD    No chief complaint on file.  Aortic stenosis  Wt Readings from Last 3 Encounters:  11/09/18 157 lb (71.2 kg)  10/04/17 156 lb 8.4 oz (71 kg)  09/07/17 156 lb 9.6 oz (71 kg)       History of Present Illness: Darin Simmons is a 77 y.o. male  who has had CAD, DES to RCA CTO .  He had an AVR in 4/66, complicated by a sternal wound infection.   He had some lightheadedness and saw Dr. Caryl Comes.  He had some postoperative arrhythmias, but no obvious correlation.  A monitor was placed.  He has had some episodes of lightheadedness, and visual changes.  30 day monitor in 11/18: Symptoms of lightheadedness assoc with PVC and atrial ectopy No tachycardia No pauses Bought 5 acres of land in [place].  He stays active.   His mother passed away at age 45.5 years.   Since the last visit, he has felt well.  He has had very shortlived dizziness, and skipped beats.  He stays active.    He takes his systemic enzymes and ashwaganda.  He wood works as well.   Denies : Chest pain.  Leg edema. Nitroglycerin use. Orthopnea. Paroxysmal nocturnal dyspnea.  Syncope.   Has some occasional sensitivity in his feet.  No claudication sx.   Past Medical History:  Diagnosis Date  . 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 Aortic stenosis   . Arthritis   . BPH (benign prostatic hyperplasia)   . CAD (coronary artery disease) 2004  . Dyspnea   . Heart attack ([place]) 2004  . Heart murmur   . Hypercholesterolemia   . 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 PVD (peripheral vascular disease) ([facility])   . Wound infection 04/2017    Past Surgical History:  Procedure Laterality Date  . AORTIC VALVE REPLACEMENT N/A 04/22/2017   Procedure: AORTIC VALVE REPLACEMENT (AVR) using Magna Ease size 70mm;  Surgeon: Gaye Pollack, MD;  Location: [facility];  Service: Open Heart Surgery;   Laterality: N/A;  . APPLICATION OF WOUND VAC N/A 05/13/2017   Procedure: WOUND VAC CHANGE;  Surgeon: Melrose Nakayama, MD;  Location: [facility];  Service: Thoracic;  Laterality: N/A;  . DENTAL SURGERY     removed  . hair follicle removed Left   . heart stent    . HERNIA REPAIR Bilateral 5/99/35   BIH and umbilical hernia repair  . INGUINAL HERNIA REPAIR Bilateral 01/18/2013   Procedure: LAPAROSCOPIC BILATERAL INGUINAL HERNIA REPAIR;  Surgeon: Odis Hollingshead, MD;  Location: [facility];  Service: General;  Laterality: Bilateral;  . INSERTION OF MESH Bilateral 01/18/2013   Procedure: INSERTION OF MESH;  Surgeon: Odis Hollingshead, MD;  Location: [facility];  Service: General;  Laterality: Bilateral;  AND UMBILICUS  . STERNAL WOUND DEBRIDEMENT N/A 05/11/2017   Procedure: STERNAL WOUND IRRIGATION AND DEBRIDEMENT;  Surgeon: Melrose Nakayama, MD;  Location: [facility];  Service: Thoracic;  Laterality: N/A;  . TEE WITHOUT CARDIOVERSION N/A 04/22/2017   Procedure: TRANSESOPHAGEAL ECHOCARDIOGRAM (TEE);  Surgeon: Gaye Pollack, MD;  Location: [facility];  Service: Open Heart Surgery;  Laterality: N/A;  . UMBILICAL HERNIA REPAIR N/A 01/18/2013   Procedure: HERNIA REPAIR UMBILICAL ADULT;  Surgeon: Odis Hollingshead, MD;  Location: [facility];  Service: General;  Laterality: N/A;     Current Outpatient Medications  Medication Sig Dispense Refill  . amoxicillin (AMOXIL) 500 MG capsule TAKE FOUR CAPSULES BY MOUTH ONE HOUR BEFORE DENTAL PROCEDURES 12 capsule 3  . nitroGLYCERIN (NITROSTAT) 0.4 MG SL tablet Place under the tongue every 5 (five) minutes as needed.     . pravastatin (PRAVACHOL) 20 MG tablet Take 1 tablet by mouth daily.     No current facility-administered medications for this visit.     Allergies:   Bee venom; Crestor [rosuvastatin]; Diphenhydramine hcl; and Statins    Social History:  The patient  reports that he quit smoking about 9 years ago. His smoking use included cigarettes. He has a 50.00  pack-year smoking history. He has never used smokeless tobacco. He reports that he does not drink alcohol or use drugs.   Family History:  The patient's family history includes Heart disease in his father.    ROS:  Please see the history of present illness.   Otherwise, review of systems are positive for occasional foot pain.   All other systems are reviewed and negative.    PHYSICAL EXAM: VS:  BP 110/64   Pulse 63   Ht 5\' 10"  (1.778 m)   Wt 157 lb (71.2 kg)   SpO2 99%   BMI 22.53 kg/m  , BMI Body mass index is 22.53 kg/m. GEN: Well nourished, well developed, in no acute distress  HEENT: normal  Neck: no JVD, carotid bruits, or masses Cardiac: RRR; 2/6 systolic murmur, no rubs, or gallops,no edema  Respiratory:  clear to auscultation bilaterally, normal work of breathing GI: soft, nontender, nondistended, + BS MS: no deformity or atrophy  Skin: warm and dry, no rash Neuro:  Strength and sensation are intact Psych: euthymic mood, full affect   EKG:   The ekg ordered today demonstrates NSR, LVH, TW inversion, PACs   Recent Labs: No results found for requested labs within last 8760 hours.   Lipid Panel    Component Value Date/Time   CHOL 178 04/06/2017 0943   TRIG 115 04/06/2017 0943   HDL 47 04/06/2017 0943   CHOLHDL 3.8 04/06/2017 0943   CHOLHDL 2.4 08/27/2015 0901   VLDL 13 08/27/2015 0901   LDLCALC 108 (H) 04/06/2017 0943     Other studies Reviewed: Additional studies/ records that were reviewed today with results demonstrating: 2018 echo- showed LVH.   ASSESSMENT AND PLAN:  1. CAD: No angina.  COntinue aggressive secondary prevention.  S/p RCA stents many years ago.  2. Hyperlipidemia: Check lipids.  Continue lipid lowering therapy.  COntinue healthy diet.  He is nearly vegetarian. 3. AVR: s/p bioprosthetic valve. COntinue SBE prophylaxis.  He should have dental cleanings twice a year.   Current medicines are reviewed at length with the patient today.  The  patient concerns regarding his medicines were addressed.  The following changes have been made:  No change  Labs/ tests ordered today include: Lipids, liver No orders of the defined types were placed in this encounter.   Recommend 150 minutes/week of aerobic exercise Low fat, low carb, high fiber diet recommended  Disposition:   FU in 1 year   Signed, Larae Grooms, MD  11/09/2018 8:34 AM    [facility] [address], [place], [place]  [address] Phone: [phone]; Fax: [phone]

## 2018-11-09 ENCOUNTER — Encounter: Payer: Self-pay | Admitting: Interventional Cardiology

## 2018-11-09 ENCOUNTER — Ambulatory Visit (INDEPENDENT_AMBULATORY_CARE_PROVIDER_SITE_OTHER): Payer: Medicare Other | Admitting: Interventional Cardiology

## 2018-11-09 VITALS — BP 110/64 | HR 63 | Ht 70.0 in | Wt 157.0 lb

## 2018-11-09 DIAGNOSIS — I2510 Atherosclerotic heart disease of native coronary artery without angina pectoris: Secondary | ICD-10-CM | POA: Diagnosis not present

## 2018-11-09 DIAGNOSIS — Z952 Presence of prosthetic heart valve: Secondary | ICD-10-CM

## 2018-11-09 DIAGNOSIS — E782 Mixed hyperlipidemia: Secondary | ICD-10-CM

## 2018-11-09 LAB — LIPID PANEL
CHOL/HDL RATIO: 4 ratio (ref 0.0–5.0)
Cholesterol, Total: 204 mg/dL — ABNORMAL HIGH (ref 100–199)
HDL: 51 mg/dL (ref >39)
LDL CALC: 126 mg/dL — AB (ref 0–99)
TRIGLYCERIDES: 133 mg/dL (ref 0–149)
VLDL Cholesterol Cal: 27 mg/dL (ref 5–40)

## 2018-11-09 LAB — HEPATIC FUNCTION PANEL
ALK PHOS: 94 IU/L (ref 39–117)
ALT: 30 IU/L (ref 0–44)
AST: 34 IU/L (ref 0–40)
Albumin: 4.4 g/dL (ref 3.5–4.8)
Bilirubin Total: 0.7 mg/dL (ref 0.0–1.2)
Bilirubin, Direct: 0.17 mg/dL (ref 0.00–0.40)
TOTAL PROTEIN: 6.4 g/dL (ref 6.0–8.5)

## 2018-11-09 NOTE — Patient Instructions (Signed)
Medication Instructions:  Your physician recommends that you continue on your current medications as directed. Please refer to the Current Medication list given to you today.  If you need a refill on your cardiac medications before your next appointment, please call your pharmacy.   Lab work: TODAY: LIPIDS, LFTS  If you have labs (blood work) drawn today and your tests are completely normal, you will receive your results only by: Marland Kitchen MyChart Message (if you have MyChart) OR . A paper copy in the mail If you have any lab test that is abnormal or we need to change your treatment, we will call you to review the results.  Testing/Procedures: None ordered  Follow-Up: At [facility], you and your health needs are our priority.  As part of our continuing mission to provide you with exceptional heart care, we have created designated Provider Care Teams.  These Care Teams include your primary Cardiologist (physician) and Advanced Practice Providers (APPs -  Physician Assistants and Nurse Practitioners) who all work together to provide you with the care you need, when you need it. . You will need a follow up appointment in 1 year.  Please call our office 2 months in advance to schedule this appointment.  You may see Casandra Doffing, MD or one of the following Advanced Practice Providers on your designated Care Team:   . Lyda Jester, PA-C . Dayna Dunn, PA-C . Ermalinda Barrios, PA-C  Any Other Special Instructions Will Be Listed Below (If Applicable).

## 2019-01-18 DIAGNOSIS — S50852A Superficial foreign body of left forearm, initial encounter: Secondary | ICD-10-CM | POA: Diagnosis not present

## 2019-01-18 DIAGNOSIS — L02414 Cutaneous abscess of left upper limb: Secondary | ICD-10-CM | POA: Diagnosis not present

## 2019-01-18 DIAGNOSIS — J019 Acute sinusitis, unspecified: Secondary | ICD-10-CM | POA: Diagnosis not present

## 2019-01-18 DIAGNOSIS — W11XXXA Fall on and from ladder, initial encounter: Secondary | ICD-10-CM | POA: Diagnosis not present

## 2019-03-01 DIAGNOSIS — J988 Other specified respiratory disorders: Secondary | ICD-10-CM | POA: Diagnosis not present

## 2019-03-01 DIAGNOSIS — R05 Cough: Secondary | ICD-10-CM | POA: Diagnosis not present

## 2019-03-28 ENCOUNTER — Telehealth: Payer: Self-pay | Admitting: Interventional Cardiology

## 2019-03-28 DIAGNOSIS — R0602 Shortness of breath: Secondary | ICD-10-CM

## 2019-03-28 NOTE — Telephone Encounter (Signed)
Returned call to patient. He states that he has had SOB since January. He states that he had a productive cough. He states that he was treated for URI with abx. He states that he has continued to have SOB and a productive cough for which he is taking Mucinex. He states that he has had intermittent episodes of a "strong ache" down his right arm and has some tingling in his fingers. He states that this typically happens at night. He states that he has been working in his garden but feels very breathless and overly tired afterwards. He denies chest pain, palpitations, LEE, weight gain or any other Sx. Will forward to Dr. Irish Lack for review.

## 2019-03-28 NOTE — Telephone Encounter (Signed)
Pt c/o Shortness Of Breath: STAT if SOB developed within the last 24 hours or pt is noticeably SOB on the phone  1. Are you currently SOB (can you hear that pt is SOB on the phone)? Yes   2. How long have you been experiencing SOB? All year  3. Are you SOB when sitting or when up moving around? Moving around.  4. Are you currently experiencing any other symptoms? Yes is having pain in right arm and some tingling.

## 2019-03-29 NOTE — Telephone Encounter (Signed)
Would check CXR, CBC w/ diff, CMet and BNP for shortness of breath.  If he has an oxygen sat machine at home, he could check that as well.

## 2019-03-29 NOTE — Telephone Encounter (Signed)
Called and spoke to patient. Made him aware that Dr. Irish Lack would like for him to have CXR and CBC w/ diff, CMET, and BNP. Patient verbalized understanding. Appt made for labs on 5/11.   COVID-19 Pre-Screening Questions:  . Do you currently have a fever? NO  . Have you recently travelled on a cruise, internationally, or to [place], [place], [place], [place], [place], or [place], [place] [employer]) ? NO  . Have you been in contact with someone that is currently pending confirmation of Covid19 testing or has been confirmed to have the [place] virus?  NO  . Are you currently experiencing fatigue or cough? YES, patient has had a productive cough and will wear a mask for his lab draw

## 2019-04-02 ENCOUNTER — Other Ambulatory Visit: Payer: Medicare Other | Admitting: *Deleted

## 2019-04-02 ENCOUNTER — Ambulatory Visit
Admission: RE | Admit: 2019-04-02 | Discharge: 2019-04-02 | Disposition: A | Discharge status: Home / Self Care | Payer: Medicare Other | Source: Ambulatory Visit | Attending: Interventional Cardiology | Admitting: Interventional Cardiology

## 2019-04-02 ENCOUNTER — Other Ambulatory Visit: Payer: Self-pay

## 2019-04-02 DIAGNOSIS — R0602 Shortness of breath: Secondary | ICD-10-CM | POA: Diagnosis not present

## 2019-04-02 DIAGNOSIS — R05 Cough: Secondary | ICD-10-CM | POA: Diagnosis not present

## 2019-04-02 LAB — COMPREHENSIVE METABOLIC PANEL
ALT: 23 IU/L (ref 0–44)
AST: 27 IU/L (ref 0–40)
Albumin/Globulin Ratio: 2.1 (ref 1.2–2.2)
Albumin: 4.7 g/dL (ref 3.7–4.7)
Alkaline Phosphatase: 93 IU/L (ref 39–117)
BUN/Creatinine Ratio: 20 (ref 10–24)
BUN: 16 mg/dL (ref 8–27)
Bilirubin Total: 0.5 mg/dL (ref 0.0–1.2)
CO2: 22 mmol/L (ref 20–29)
Calcium: 8.8 mg/dL (ref 8.6–10.2)
Chloride: 101 mmol/L (ref 96–106)
Creatinine, Ser: 0.79 mg/dL (ref 0.76–1.27)
GFR calc Af Amer: 102 mL/min/{1.73_m2} (ref >59)
GFR calc non Af Amer: 88 mL/min/{1.73_m2} (ref >59)
Globulin, Total: 2.2 g/dL (ref 1.5–4.5)
Glucose: 86 mg/dL (ref 65–99)
Potassium: 4 mmol/L (ref 3.5–5.2)
Sodium: 141 mmol/L (ref 134–144)
Total Protein: 6.9 g/dL (ref 6.0–8.5)

## 2019-04-02 LAB — CBC WITH DIFFERENTIAL/PLATELET
Basophils Absolute: 0.1 10*3/uL (ref 0.0–0.2)
Basos: 1 %
EOS (ABSOLUTE): 0.2 10*3/uL (ref 0.0–0.4)
Eos: 4 %
Hematocrit: 44.1 % (ref 37.5–51.0)
Hemoglobin: 15.7 g/dL (ref 13.0–17.7)
Immature Grans (Abs): 0 10*3/uL (ref 0.0–0.1)
Immature Granulocytes: 0 %
Lymphocytes Absolute: 1.1 10*3/uL (ref 0.7–3.1)
Lymphs: 20 %
MCH: 32.2 pg (ref 26.6–33.0)
MCHC: 35.6 g/dL (ref 31.5–35.7)
MCV: 90 fL (ref 79–97)
Monocytes Absolute: 0.4 10*3/uL (ref 0.1–0.9)
Monocytes: 7 %
Neutrophils Absolute: 3.8 10*3/uL (ref 1.4–7.0)
Neutrophils: 68 %
Platelets: 130 10*3/uL — ABNORMAL LOW (ref 150–450)
RBC: 4.88 x10E6/uL (ref 4.14–5.80)
RDW: 12.4 % (ref 11.6–15.4)
WBC: 5.6 10*3/uL (ref 3.4–10.8)

## 2019-04-02 LAB — PRO B NATRIURETIC PEPTIDE: NT-Pro BNP: 619 pg/mL — ABNORMAL HIGH (ref 0–486)

## 2019-04-04 ENCOUNTER — Telehealth: Payer: Self-pay | Admitting: Interventional Cardiology

## 2019-04-04 MED ORDER — FUROSEMIDE 40 MG PO TABS: 40.0000 mg | ORAL_TABLET | Freq: Every day | ORAL | 0 refills | Status: DC | PRN

## 2019-04-04 NOTE — Telephone Encounter (Signed)
-----   Message from Jettie Booze, MD sent at 04/03/2019  5:35 PM EDT ----- May have some extra fluid in his system.  Can try Lasix 40 mg PO daily prn SHOB over the next few days and see if this helps.  If not, would plan for echocardiogram.

## 2019-04-04 NOTE — Telephone Encounter (Signed)
New message:   Patient calling concerning some results. Please call patient.

## 2019-04-04 NOTE — Telephone Encounter (Signed)
-----   Message from Jettie Booze, MD sent at 04/02/2019  1:16 PM EDT ----- Normal chest xray

## 2019-04-04 NOTE — Telephone Encounter (Signed)
The patient has been notified of CXR and lab results and recommendations to try lasix 40 mg QD PRN to see if that helps. Patient will call back if Sx do not improve. Rx sent to preferred pharmacy. All questions (if any) were answered.

## 2019-04-05 ENCOUNTER — Telehealth: Payer: Self-pay | Admitting: Interventional Cardiology

## 2019-04-05 DIAGNOSIS — R001 Bradycardia, unspecified: Secondary | ICD-10-CM

## 2019-04-05 NOTE — Telephone Encounter (Signed)
Spoke to patient. He states that yesterday he felt "hung over". He states that he had a brief episode of double vision. He states that he felt his pulse and it felt irregular and slow. He did not have an exact rate but states that he thinks it was around 50. He states that this lasted for about 15 minutes and resolved. No recurrent episodes. He does not have a way to check his vitals. Patient has a Hx of PVCs/PACs but may be helpful to assess rhythm as well given his continued SOB. However, patient has not picked up his RX for lasix yet but says he will tomorrow. Will forward to Dr. Irish Lack for review.

## 2019-04-05 NOTE — Telephone Encounter (Signed)
Pt called and stated that in addition to the fluid on his lungs, he had a low and irregular heart rate yesterday afternoon. He also had double vision, which lasted about 15 minutes.  He is doing better now. He hopes to get his medication from the pharmacy later today.

## 2019-04-06 ENCOUNTER — Telehealth: Payer: Self-pay | Admitting: Radiology

## 2019-04-06 NOTE — Telephone Encounter (Signed)
Called and made patient aware that Dr. Irish Lack would like to order 14 day zio patch. Patient states that he picked up his lasix Rx and states that he is feeling a little better.

## 2019-04-06 NOTE — Telephone Encounter (Signed)
Enrolled patient for a 14 day Zio Long term  monitor to be mailed. Patient knows to expect the monitor to arrive in 3-4 days

## 2019-04-06 NOTE — Addendum Note (Signed)
Addended by: Drue Novel I on: 04/06/2019 05:33 PM   Modules accepted: Orders

## 2019-04-12 ENCOUNTER — Ambulatory Visit (INDEPENDENT_AMBULATORY_CARE_PROVIDER_SITE_OTHER): Payer: Medicare Other

## 2019-04-12 DIAGNOSIS — R001 Bradycardia, unspecified: Secondary | ICD-10-CM | POA: Diagnosis not present

## 2019-05-03 DIAGNOSIS — R001 Bradycardia, unspecified: Secondary | ICD-10-CM | POA: Diagnosis not present

## 2019-05-04 ENCOUNTER — Other Ambulatory Visit: Payer: Self-pay

## 2019-05-07 ENCOUNTER — Telehealth: Payer: Self-pay | Admitting: Interventional Cardiology

## 2019-05-07 NOTE — Telephone Encounter (Signed)
Called and made patient aware of results and recommendations. Patient states that he feels a lot better this last week and has been able to work in his garden. Patient states that he will wait on EP referral for now and will let us know if his Sx change or worsen.

## 2019-05-07 NOTE — Telephone Encounter (Signed)
New Message   Patient calling for monitor results.

## 2019-05-07 NOTE — Telephone Encounter (Addendum)
-----   Message from Jettie Booze, MD sent at 05/04/2019  5:32 PM EDT ----- As noted in results.   Narrative & Impression      Predominantly normal sinus rhythm.  Frequent PACs. Occasional short runs of SVT. Max HR 182 for 5 beats. Longest duration 16.2 seconds at 108 bpm.  No sustained pathologic arrhythmias.   If he is feeling a lot of sx from his PACs, could refer to EP.

## 2019-08-30 DIAGNOSIS — Z23 Encounter for immunization: Secondary | ICD-10-CM | POA: Diagnosis not present

## 2019-11-20 ENCOUNTER — Encounter: Payer: Self-pay | Admitting: *Deleted

## 2019-11-29 NOTE — Progress Notes (Signed)
Cardiology Office Note  This visit type was conducted due to national recommendations for restrictions regarding the COVID-19 Pandemic (e.g. social distancing) in an effort to limit this patient's exposure and mitigate transmission in our community.  Due to his co-morbid illnesses, this patient is at least at moderate risk for complications without adequate follow up.  This format is felt to be most appropriate for this patient at this time.  The patient did not have access to video technology/had technical difficulties with video requiring transitioning to audio format only (telephone).  All issues noted in this document were discussed and addressed.  No physical exam could be performed with this format.  Please refer to the patient's chart for his  consent to telehealth for [facility].   Patient at home Provider at office   Date:  11/30/2019   ID:  Darin Simmons, Darin Simmons 1941/05/22, MRN [id number]  PCP:  London Pepper, MD    No chief complaint on file.  S/p AVR  Wt Readings from Last 3 Encounters:  11/30/19 155 lb (70.3 kg)  11/09/18 157 lb (71.2 kg)  10/04/17 156 lb 8.4 oz (71 kg)       History of Present Illness: Darin Simmons is a 79 y.o. male  who has had CAD, DES to RCA CTO. He had an AVR in [id number], complicated by a sternal wound infection.   He had some lightheadedness and saw Dr. Caryl Comes. He had some postoperative arrhythmias, but no obvious correlation. A monitor was placed. He has had some episodes of lightheadedness, and visual changes.  30 day monitor in 11/18: Symptoms of lightheadedness assoc with PVC and atrial ectopy No tachycardia No pauses Bought 5 acres of land in [place].  He stays active.   His mother passed away at age 31.5 years.   In the past, he reported sensitivity in his feet but no claudication. He has had very shortlived dizziness, and skipped beats.  He stays active.    He takes his systemic enzymes and ashwaganda, among other herbals  meds.  He wood works as well.   Since the last visit, he has done well.   He has been cautious with COVID.  He has a limited circle of friends who are also careful.   Denies : Chest pain.  Leg edema. Nitroglycerin use. Orthopnea. Palpitations. Paroxysmal nocturnal dyspnea. Shortness of breath. Syncope.   Rare dizziness.  Has some balance issues as well.  Monitor in June 2020 showed:  Predominantly normal sinus rhythm.  Frequent PACs. Occasional short runs of SVT. Max HR 182 for 5 beats. Longest duration 16.2 seconds at 108 bpm.  No sustained pathologic arrhythmias.   If he is feeling a lot of sx from his PACs, could refer to EP. "  He continues to try to be active when the weather allows.  He grows his own vegetables.  He stretches.  He feels well during these exercises.  He takes herbals meds that "thin the blood."  He did stop systemic enzymes for a while, but is back taking them now. Eating garlic.  He was very distressed [place] and the efforts to overturn the election.    No sx of COVID    Past Medical History:  Diagnosis Date  . 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 Aortic stenosis   . Arthritis   . Bilateral inguinal hernia 03/28/2012  . BPH (benign prostatic hyperplasia)   .  CAD (coronary artery disease) 2004  . Dyspnea   . Heart attack ([place]) 2004  . Heart murmur   . Hypercholesterolemia   . 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 PVD (peripheral vascular disease) ([place])   . 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 Umbilical hernia [id number]  . Wound infection 04/2017    Past Surgical History:  Procedure Laterality Date  . AORTIC VALVE REPLACEMENT N/A 04/22/2017   Procedure: AORTIC VALVE REPLACEMENT (AVR) using Magna Ease size 78mm;  Surgeon: Gaye Pollack, MD;  Location: [facility];  Service: Open Heart Surgery;  Laterality: N/A;  . APPLICATION OF WOUND VAC N/A 05/13/2017    Procedure: WOUND VAC CHANGE;  Surgeon: Melrose Nakayama, MD;  Location: [facility];  Service: Thoracic;  Laterality: N/A;  . DENTAL SURGERY     removed  . hair follicle removed Left   . heart stent    . HERNIA REPAIR Bilateral [id number]   BIH and umbilical hernia repair  . INGUINAL HERNIA REPAIR Bilateral 01/18/2013   Procedure: LAPAROSCOPIC BILATERAL INGUINAL HERNIA REPAIR;  Surgeon: Odis Hollingshead, MD;  Location: [facility];  Service: General;  Laterality: Bilateral;  . INSERTION OF MESH Bilateral 01/18/2013   Procedure: INSERTION OF MESH;  Surgeon: Odis Hollingshead, MD;  Location: [facility];  Service: General;  Laterality: Bilateral;  AND UMBILICUS  . STERNAL WOUND DEBRIDEMENT N/A 05/11/2017   Procedure: STERNAL WOUND IRRIGATION AND DEBRIDEMENT;  Surgeon: Melrose Nakayama, MD;  Location: [facility];  Service: Thoracic;  Laterality: N/A;  . TEE WITHOUT CARDIOVERSION N/A 04/22/2017   Procedure: TRANSESOPHAGEAL ECHOCARDIOGRAM (TEE);  Surgeon: Gaye Pollack, MD;  Location: [facility];  Service: Open Heart Surgery;  Laterality: N/A;  . UMBILICAL HERNIA REPAIR N/A 01/18/2013   Procedure: HERNIA REPAIR UMBILICAL ADULT;  Surgeon: Odis Hollingshead, MD;  Location: [facility];  Service: General;  Laterality: N/A;     Current Outpatient Medications  Medication Sig Dispense Refill  . amoxicillin (AMOXIL) 500 MG capsule TAKE FOUR CAPSULES BY MOUTH ONE HOUR BEFORE DENTAL PROCEDURES 12 capsule 3  . nitroGLYCERIN (NITROSTAT) 0.4 MG SL tablet Place under the tongue every 5 (five) minutes as needed.     . pravastatin (PRAVACHOL) 20 MG tablet Take 1 tablet by mouth daily.    . sildenafil (VIAGRA) 50 MG tablet Take 50 mg by mouth daily as needed.     No current facility-administered medications for this visit.    Allergies:   Bee venom, Crestor [rosuvastatin], Diphenhydramine hcl, and Statins    Social History:  The patient  reports that he quit smoking about 11 years ago. His smoking use included cigarettes. He has a  50.00 pack-year smoking history. He has never used smokeless tobacco. He reports that he does not drink alcohol or use drugs.   Family History:  The patient's family history includes Heart disease in his father.    ROS:  Please see the history of present illness.   Otherwise, review of systems are positive for .   All other systems are reviewed and negative.    PHYSICAL EXAM: VS:  Ht 5\' 10"  (1.778 m)   Wt 155 lb (70.3 kg)   BMI 22.24 kg/m  , BMI Body mass index is 22.24 kg/m. GEN:  in no acute distress  Respiratory:  No shortness of breath Neuro:  A,O x 3 Psych: euthymic mood, full affect   EKG:   The ekg ordered 12/19 demonstrates NSR, PACs,  LVH, lateral TWI   Recent Labs: 04/02/2019: ALT 23; BUN 16; Creatinine, Ser 0.79; Hemoglobin 15.7; NT-Pro BNP 619; Platelets 130; Potassium 4.0; Sodium 141   Lipid Panel    Component Value Date/Time   CHOL 204 (H) 11/09/2018 0850   TRIG 133 11/09/2018 0850   HDL 51 11/09/2018 0850   CHOLHDL 4.0 11/09/2018 0850   CHOLHDL 2.4 08/27/2015 0901   VLDL 13 08/27/2015 0901   LDLCALC 126 (H) 11/09/2018 0850     Other studies Reviewed: Additional studies/ records that were reviewed today with results demonstrating: labs reviewed.   ASSESSMENT AND PLAN:  1. CAD: No angina. Continue aggressive secondary prevention.  He continues to prefer is natural supplements.  2. Hyperlipidemia: LDL in 2018 Dec.  Trying to use supplements to lower cholesterol.  He would like to come off of statins.  He would like get his labs done at some point later in the year, when more COVID vaccines have been distributed.  3. S/p AVR: s/p bioprosthetic valve.  Continue SBE prophylaxis.  4. Bradycardia: Noted in the past.  HR was 63 on ECG in 12/19.   Monitor in June 2020 did not show significant bradycardia.    Current medicines are reviewed at length with the patient today.  The patient concerns regarding his medicines were addressed.  The following changes have  been made:  No change  Labs/ tests ordered today include:  No orders of the defined types were placed in this encounter.  COVID-19 Education: The signs and symptoms of COVID-19 were discussed with the patient and how to seek care for testing (follow up with PCP or arrange E-visit).  The importance of social distancing was discussed today.  Time:   Today, I have spent 25 minutes with the patient with telehealth technology discussing the above problems.    Recommend 150 minutes/week of aerobic exercise Low fat, low carb, high fiber diet recommended  Disposition:   FU in 1 year, He will schedule labs when COVID is more under control- CMet and lipids.   Signed, Larae Grooms, MD  11/30/2019 8:09 AM    [facility] [address], [place], [place]  [address] Phone: [phone]; Fax: [phone]

## 2019-11-30 ENCOUNTER — Encounter: Payer: Self-pay | Admitting: Interventional Cardiology

## 2019-11-30 ENCOUNTER — Other Ambulatory Visit: Payer: Self-pay

## 2019-11-30 ENCOUNTER — Telehealth: Payer: Self-pay

## 2019-11-30 ENCOUNTER — Telehealth (INDEPENDENT_AMBULATORY_CARE_PROVIDER_SITE_OTHER): Payer: Medicare Other | Admitting: Interventional Cardiology

## 2019-11-30 VITALS — Ht 70.0 in | Wt 155.0 lb

## 2019-11-30 DIAGNOSIS — Z952 Presence of prosthetic heart valve: Secondary | ICD-10-CM

## 2019-11-30 DIAGNOSIS — E782 Mixed hyperlipidemia: Secondary | ICD-10-CM | POA: Diagnosis not present

## 2019-11-30 DIAGNOSIS — R001 Bradycardia, unspecified: Secondary | ICD-10-CM | POA: Diagnosis not present

## 2019-11-30 DIAGNOSIS — I2510 Atherosclerotic heart disease of native coronary artery without angina pectoris: Secondary | ICD-10-CM | POA: Diagnosis not present

## 2019-11-30 NOTE — Patient Instructions (Signed)
Medication Instructions:  Your physician recommends that you continue on your current medications as directed. Please refer to the Current Medication list given to you today.  *If you need a refill on your cardiac medications before your next appointment, please call your pharmacy*  Lab Work: Your physician recommends that you return for a FASTING LIPIDS and CMET. Please let us know when you are ready to schedule lab appointment.  If you have labs (blood work) drawn today and your tests are completely normal, you will receive your results only by: Marland Kitchen MyChart Message (if you have MyChart) OR . A paper copy in the mail If you have any lab test that is abnormal or we need to change your treatment, we will call you to review the results.  Testing/Procedures: None ordered  Follow-Up: At [facility], you and your health needs are our priority.  As part of our continuing mission to provide you with exceptional heart care, we have created designated Provider Care Teams.  These Care Teams include your primary Cardiologist (physician) and Advanced Practice Providers (APPs -  Physician Assistants and Nurse Practitioners) who all work together to provide you with the care you need, when you need it.  Your next appointment:   12 month(s)  The format for your next appointment:   In Person  Provider:   You may see Larae Grooms, MD or one of the following Advanced Practice Providers on your designated Care Team:    Melina Copa, PA-C  Ermalinda Barrios, PA-C   Other Instructions

## 2019-11-30 NOTE — Telephone Encounter (Signed)
Virtual Visit Pre-Appointment Phone Call  TELEPHONE CALL NOTE  Darin Simmons has been deemed a candidate for a follow-up tele-health visit to limit community exposure during the Covid-19 pandemic. I spoke with the patient via phone to ensure availability of phone/video source, confirm preferred email & phone number, and discuss instructions and expectations.  I reminded Darin Simmons to be prepared with any vital sign and/or heart rhythm information that could potentially be obtained via home monitoring, at the time of his visit. I reminded Darin Simmons to expect a phone call prior to his visit.   Patient agrees to consent below.  Darin Gustin, RN 11/30/2019 7:50 AM   FULL LENGTH CONSENT FOR TELE-HEALTH VISIT   I hereby voluntarily request, consent and authorize [facility] and its employed or contracted physicians, physician assistants, nurse practitioners or other licensed health care professionals (the Practitioner), to provide me with telemedicine health care services (the "Services") as deemed necessary by the treating Practitioner. I acknowledge and consent to receive the Services by the Practitioner via telemedicine. I understand that the telemedicine visit will involve communicating with the Practitioner through live audiovisual communication technology and the disclosure of certain medical information by electronic transmission. I acknowledge that I have been given the opportunity to request an in-person assessment or other available alternative prior to the telemedicine visit and am voluntarily participating in the telemedicine visit.  I understand that I have the right to withhold or withdraw my consent to the use of telemedicine in the course of my care at any time, without affecting my right to future care or treatment, and that the Practitioner or I may terminate the telemedicine visit at any time. I understand that I have the right to inspect all information obtained  and/or recorded in the course of the telemedicine visit and may receive copies of available information for a reasonable fee.  I understand that some of the potential risks of receiving the Services via telemedicine include:  Marland Kitchen Delay or interruption in medical evaluation due to technological equipment failure or disruption; . Information transmitted may not be sufficient (e.g. poor resolution of images) to allow for appropriate medical decision making by the Practitioner; and/or  . In rare instances, security protocols could fail, causing a breach of personal health information.  Furthermore, I acknowledge that it is my responsibility to provide information about my medical history, conditions and care that is complete and accurate to the best of my ability. I acknowledge that Practitioner's advice, recommendations, and/or decision may be based on factors not within their control, such as incomplete or inaccurate data provided by me or distortions of diagnostic images or specimens that may result from electronic transmissions. I understand that the practice of medicine is not an exact science and that Practitioner makes no warranties or guarantees regarding treatment outcomes. I acknowledge that I will receive a copy of this consent concurrently upon execution via email to the email address I last provided but may also request a printed copy by calling the office of [facility].    I understand that my insurance will be billed for this visit.   I have read or had this consent read to me. . I understand the contents of this consent, which adequately explains the benefits and risks of the Services being provided via telemedicine.  . I have been provided ample opportunity to ask questions regarding this consent and the Services and have had my questions answered to my satisfaction. Marland Kitchen  I give my informed consent for the services to be provided through the use of telemedicine in my medical care  By  participating in this telemedicine visit I agree to the above.

## 2020-01-13 ENCOUNTER — Ambulatory Visit: Payer: Medicare Other | Attending: Internal Medicine

## 2020-01-13 DIAGNOSIS — Z23 Encounter for immunization: Secondary | ICD-10-CM

## 2020-01-13 NOTE — Progress Notes (Signed)
   Covid-19 Vaccination Clinic  Name:  CLARISSA ACHA    MRN: [id number] DOB: [birth year]  01/13/2020  Mr. Vandekamp was observed post Covid-19 immunization for 15 minutes without incidence. He was provided with Vaccine Information Sheet and instruction to access the V-Safe system.   Mr. Stoddart was instructed to call 911 with any severe reactions post vaccine: Marland Kitchen Difficulty breathing  . Swelling of your face and throat  . A fast heartbeat  . A bad rash all over your body  . Dizziness and weakness    Immunizations Administered    Name Date Dose VIS Date Route   Pfizer COVID-19 Vaccine 01/13/2020  9:47 AM 0.3 mL 11/02/2019 Intramuscular   Manufacturer: [place]   Lot: [id number]   [place]: [id number]

## 2020-02-06 ENCOUNTER — Ambulatory Visit: Payer: Medicare Other | Attending: Internal Medicine

## 2020-02-06 DIAGNOSIS — Z23 Encounter for immunization: Secondary | ICD-10-CM

## 2020-02-06 NOTE — Progress Notes (Signed)
   Covid-19 Vaccination Clinic  Name:  Darin ABRUZZESE    MRN: [id number] DOB: 1941-05-23  02/06/2020  [name] was observed post Covid-19 immunization for 15 minutes without incident. He was provided with Vaccine Information Sheet and instruction to access the V-Safe system.   [name] was instructed to call 911 with any severe reactions post vaccine: Marland Kitchen Difficulty breathing  . Swelling of face and throat  . A fast heartbeat  . A bad rash all over body  . Dizziness and weakness   Immunizations Administered    Name Date Dose VIS Date Route   Pfizer COVID-19 Vaccine 02/06/2020  8:56 AM 0.3 mL 11/02/2019 Intramuscular   Manufacturer: [place]   Lot: [id number]   [place]: [id number]

## 2020-11-22 ENCOUNTER — Telehealth: Payer: Self-pay | Admitting: Medical

## 2020-11-22 NOTE — Telephone Encounter (Signed)
   Patient called the after hours line in preparation for his upcoming visit with Dr. Eldridge Dace 11/24/20. He reports he has some chronic SOB/DOE and had a transient sore throat a few days ago which lasted <24 hours. He has chronic nasal congestion which is unchanged. 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 No loss of smell/taste, fevers, nausea/vomiting, or cough. I cant know for sure that he does not have COVID-19, however symptoms do not sound c/w this. I encouraged him to keep his visit with Dr. Eldridge Dace 11/24/20, though if symptoms change he will stay home. He will wear his mask per office protocol. He was in agreement with the plan and appreciative of the call.   Beatriz Stallion, PA-C 11/22/20; 1:48 PM

## 2020-11-23 NOTE — Progress Notes (Unsigned)
Cardiology Office Note   Date:  11/24/2020   ID:  Darin Simmons 1940-11-23, MRN [id number]  PCP:  Darin Has, MD    No chief complaint on file.  CAD  Wt Readings from Last 3 Encounters:  11/24/20 150 lb (68 kg)  11/30/19 155 lb (70.3 kg)  11/09/18 157 lb (71.2 kg)       History of Present Illness: Darin Simmons is a 80 y.o. male  who Simmons had CAD, DES to RCA CTO. He had an AVR in 6/18, complicated by a sternal wound infection.   He had some lightheadedness and saw Dr. Graciela Simmons. He had some postoperative arrhythmias, but no obvious correlation. A monitor was placed. He Simmons had some episodes of lightheadedness, and visual changes.  30 day monitor in 11/18: Symptoms of lightheadedness assoc with PVC and atrial ectopy No tachycardia No pauses Bought 5 acres of land in [place]. He stays active.   His mother passed away at age 41.5 years.   In the past, he reported sensitivity in his feet but no claudication. He Simmons had very shortlived dizziness, and skipped beats. He stays active.   He takes his systemic enzymes and ashwaganda, among other herbals meds. He wood works as well.   Simmons some balance issues as well.  Monitor in June 2020 showed:  Predominantly normal sinus rhythm.  Frequent PACs. Occasional short runs of SVT. Max HR 182 for 5 beats. Longest duration 16.2 seconds at 108 bpm.  No sustained pathologic arrhythmias.  If he is feeling a lot of sx from his PACs, could refer to EP. "  He continues to try to be active when the weather allows.  He grows his own vegetables.  He stretches.  He feels well during these exercises.  He takes herbals meds that "thin the blood."  He did stop systemic enzymes for a while, but is back taking them now. Eating garlic.  He was very distressed [place] and the efforts to overturn the election in Jan 2021.   In March 2021, he thinks he caught COVID, but was never tested.  He had sx  of "brain fog" while building a greenhouse.  He finished it.  He tolerated all 3 vaccines against for COVID.  He continued to walk regularly and felt well.  Over the past week, he felt some shortness of breath.  It lasts a second, sometimes in the shower- resolves after the 1-2 seconds; can occur while working in the garden and can last a few minutes.  He Simmons felt well walking.  He called the oncall MD because of the shortness of breath.  He was told to wait until today's appt.  He did walk 2 days ago without any problems.   His health is his biggest concern.    When he checks his BP at home, it is typically normal.  He continues to eat healthy.  He grows a lot of his own vegetables.  He carries heavy bags of seed.  He Simmons some anxiety and family stress with his siblings.  Worse at the holidays.  Better now.   Past Medical History:  Diagnosis Date  . 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 Aortic stenosis   . Arthritis   . Bilateral inguinal hernia 03/28/2012  . BPH (benign prostatic hyperplasia)   . CAD (coronary artery disease) 2004  . Dyspnea   . Heart attack (  [place]) 2004  . Heart murmur   . Hypercholesterolemia   . 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 PVD (peripheral vascular disease) ([place])   . 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 Umbilical hernia [id number]  . Wound infection 04/2017    Past Surgical History:  Procedure Laterality Date  . AORTIC VALVE REPLACEMENT N/A 04/22/2017   Procedure: AORTIC VALVE REPLACEMENT (AVR) using Magna Ease size 42mm;  Surgeon: Darin Pollack, MD;  Location: [facility];  Service: Open Heart Surgery;  Laterality: N/A;  . APPLICATION OF WOUND VAC N/A 05/13/2017   Procedure: WOUND VAC CHANGE;  Surgeon: Melrose Nakayama, MD;  Location: [facility];  Service: Thoracic;  Laterality: N/A;  . DENTAL SURGERY     removed  . hair follicle removed Left   . heart stent    . HERNIA REPAIR Bilateral  [id number]   BIH and umbilical hernia repair  . INGUINAL HERNIA REPAIR Bilateral 01/18/2013   Procedure: LAPAROSCOPIC BILATERAL INGUINAL HERNIA REPAIR;  Surgeon: Odis Hollingshead, MD;  [facility];  Service: General;  Laterality: Bilateral;  . INSERTION OF MESH Bilateral 01/18/2013   Procedure: INSERTION OF MESH;  Surgeon: Odis Hollingshead, MD;  [facility];  Service: General;  Laterality: Bilateral;  AND UMBILICUS  . STERNAL WOUND DEBRIDEMENT N/A 05/11/2017   Procedure: STERNAL WOUND IRRIGATION AND DEBRIDEMENT;  Surgeon: Melrose Nakayama, MD;  Location: [facility];  Service: Thoracic;  Laterality: N/A;  . TEE WITHOUT CARDIOVERSION N/A 04/22/2017   Procedure: TRANSESOPHAGEAL ECHOCARDIOGRAM (TEE);  Surgeon: Darin Pollack, MD;  Location: [facility];  Service: Open Heart Surgery;  Laterality: N/A;  . UMBILICAL HERNIA REPAIR N/A 01/18/2013   Procedure: HERNIA REPAIR UMBILICAL ADULT;  Surgeon: Odis Hollingshead, MD;  [facility];  Service: General;  Laterality: N/A;     Current Outpatient Medications  Medication Sig Dispense Refill  . amoxicillin (AMOXIL) 500 MG capsule TAKE FOUR CAPSULES BY MOUTH ONE HOUR BEFORE DENTAL PROCEDURES 12 capsule 3  . nitroGLYCERIN (NITROSTAT) 0.4 MG SL tablet Place under the tongue every 5 (five) minutes as needed.     . sildenafil (VIAGRA) 50 MG tablet Take 50 mg by mouth daily as needed.    . pravastatin (PRAVACHOL) 40 MG tablet Take 40 mg by mouth at bedtime.     No current facility-administered medications for this visit.    Allergies:   Bee venom, Crestor [rosuvastatin], Diphenhydramine hcl, Statins, Diphenhydramine hcl, and Other    Social History:  The patient  reports that he quit smoking about 12 years ago. His smoking use included cigarettes. He Simmons a 50.00 pack-year smoking history. He Simmons never used smokeless tobacco. He reports that he does not drink alcohol and does not use drugs.   Family History:  The patient's family history includes Heart  disease in his father.    ROS:  Please see the history of present illness.   Otherwise, review of systems are positive for mild DOE.   All other systems are reviewed and negative.    PHYSICAL EXAM: VS:  BP (!) 150/82   Pulse (!) 55   Ht 5\' 10"  (1.778 m)   Wt 150 lb (68 kg)   BMI 21.52 kg/m  , BMI Body mass index is 21.52 kg/m. GEN: Well nourished, well developed, in no acute distress  HEENT: normal  Neck: no JVD, carotid bruits, or masses Cardiac: RRR; 1/6 systolic murmur, rubs, or gallops,no edema  Respiratory:  clear to auscultation bilaterally, normal work of breathing GI: soft, nontender, nondistended, + BS MS: no deformity or atrophy  Skin: warm and dry, no rash Neuro:  Strength and sensation are intact Psych: euthymic mood, full affect   EKG:   The ekg ordered today demonstrates SB, LVH, lateral T wave inversions- no change from prior   Recent Labs: No results found for requested labs within last 8760 hours.   Lipid Panel    Component Value Date/Time   CHOL 204 (H) 11/09/2018 0850   TRIG 133 11/09/2018 0850   HDL 51 11/09/2018 0850   CHOLHDL 4.0 11/09/2018 0850   CHOLHDL 2.4 08/27/2015 0901   VLDL 13 08/27/2015 0901   LDLCALC 126 (H) 11/09/2018 0850     Other studies Reviewed: Additional studies/ records that were reviewed today with results demonstrating: Labs reviewed; Cr normal.  LDL 114.  Hbg 15.7 in 2020   ASSESSMENT AND PLAN:  1. CAD: Status post RCA PCI several years ago.  Continue aggressive secondary prevention.  No clear angina.  2. Hyperlipidemia: whole food plant based diet.  Continue pravastatin.  LDL 114 in 6/21.  He Simmons been hesitant to try more potent statins.  Will check lipids.  Will double check 3. S/p AVR: bioprosthetic valve.  Continue with SBE prophylaxis for dental treatments.  Valve sounds like it is functioning well.  If [facility] worsens, could consider echocardiogram.  4. Bradycardia: No rate slowing drugs given baseline bradycardia.   Some fatigue but no bradycardia noted.  Wil check TSH as well.    Current medicines are reviewed at length with the patient today.  The patient concerns regarding his medicines were addressed.  The following changes have been made:  No change  Labs/ tests ordered today include: CMet, lipids, TSH,  No orders of the defined types were placed in this encounter.   Recommend 150 minutes/week of aerobic exercise Low fat, low carb, high fiber diet recommended  Disposition:   FU in 1 year    Signed, Larae Grooms, MD  11/24/2020 8:37 AM    [facility] [address], [place], [place]  [address] Phone: [phone]; Fax: [phone]

## 2020-11-24 ENCOUNTER — Encounter: Payer: Self-pay | Admitting: Interventional Cardiology

## 2020-11-24 ENCOUNTER — Ambulatory Visit (INDEPENDENT_AMBULATORY_CARE_PROVIDER_SITE_OTHER): Payer: Medicare Other | Admitting: Interventional Cardiology

## 2020-11-24 ENCOUNTER — Other Ambulatory Visit: Payer: Self-pay

## 2020-11-24 VITALS — BP 150/82 | HR 55 | Ht 70.0 in | Wt 150.0 lb

## 2020-11-24 DIAGNOSIS — R5383 Other fatigue: Secondary | ICD-10-CM

## 2020-11-24 DIAGNOSIS — I2510 Atherosclerotic heart disease of native coronary artery without angina pectoris: Secondary | ICD-10-CM | POA: Diagnosis not present

## 2020-11-24 DIAGNOSIS — Z952 Presence of prosthetic heart valve: Secondary | ICD-10-CM

## 2020-11-24 DIAGNOSIS — R001 Bradycardia, unspecified: Secondary | ICD-10-CM

## 2020-11-24 DIAGNOSIS — R0602 Shortness of breath: Secondary | ICD-10-CM

## 2020-11-24 DIAGNOSIS — E782 Mixed hyperlipidemia: Secondary | ICD-10-CM

## 2020-11-24 LAB — COMPREHENSIVE METABOLIC PANEL
ALT: 22 IU/L (ref 0–44)
AST: 27 IU/L (ref 0–40)
Albumin/Globulin Ratio: 2 (ref 1.2–2.2)
Albumin: 4.6 g/dL (ref 3.7–4.7)
Alkaline Phosphatase: 95 IU/L (ref 44–121)
BUN/Creatinine Ratio: 20 (ref 10–24)
BUN: 14 mg/dL (ref 8–27)
Bilirubin Total: 0.5 mg/dL (ref 0.0–1.2)
CO2: 27 mmol/L (ref 20–29)
Calcium: 8.7 mg/dL (ref 8.6–10.2)
Chloride: 102 mmol/L (ref 96–106)
Creatinine, Ser: 0.71 mg/dL — ABNORMAL LOW (ref 0.76–1.27)
GFR calc Af Amer: 105 mL/min/{1.73_m2} (ref >59)
GFR calc non Af Amer: 90 mL/min/{1.73_m2} (ref >59)
Globulin, Total: 2.3 g/dL (ref 1.5–4.5)
Glucose: 85 mg/dL (ref 65–99)
Potassium: 4.3 mmol/L (ref 3.5–5.2)
Sodium: 140 mmol/L (ref 134–144)
Total Protein: 6.9 g/dL (ref 6.0–8.5)

## 2020-11-24 LAB — LIPID PANEL
Chol/HDL Ratio: 4.2 ratio (ref 0.0–5.0)
Cholesterol, Total: 183 mg/dL (ref 100–199)
HDL: 44 mg/dL (ref >39)
LDL Chol Calc (NIH): 112 mg/dL — ABNORMAL HIGH (ref 0–99)
Triglycerides: 150 mg/dL — ABNORMAL HIGH (ref 0–149)
VLDL Cholesterol Cal: 27 mg/dL (ref 5–40)

## 2020-11-24 LAB — PRO B NATRIURETIC PEPTIDE: NT-Pro BNP: 599 pg/mL — ABNORMAL HIGH (ref 0–486)

## 2020-11-24 LAB — TSH: TSH: 5.49 u[IU]/mL — ABNORMAL HIGH (ref 0.450–4.500)

## 2020-11-24 NOTE — Patient Instructions (Addendum)
Medication Instructions:  Your physician recommends that you continue on your current medications as directed. Please refer to the Current Medication list given to you today.  *If you need a refill on your cardiac medications before your next appointment, please call your pharmacy*   Lab Work: Your physician recommends that you have lab work today. CMET, BNP, Lipid panel, and TSH  If you have labs (blood work) drawn today and your tests are completely normal, you will receive your results only by: Marland Kitchen MyChart Message (if you have MyChart) OR . A paper copy in the mail If you have any lab test that is abnormal or we need to change your treatment, we will call you to review the results.   Testing/Procedures: None ordered today.   Follow-Up: At [facility], you and your health needs are our priority.  As part of our continuing mission to provide you with exceptional heart care, we have created designated Provider Care Teams.  These Care Teams include your primary Cardiologist (physician) and Advanced Practice Providers (APPs -  Physician Assistants and Nurse Practitioners) who all work together to provide you with the care you need, when you need it.  We recommend signing up for the patient portal called "MyChart".  Sign up information is provided on this After Visit Summary.  MyChart is used to connect with patients for Virtual Visits (Telemedicine).  Patients are able to view lab/test results, encounter notes, upcoming appointments, etc.  Non-urgent messages can be sent to your provider as well.   To learn more about what you can do with MyChart, go to [email or web address].    Your next appointment:   12 month(s)  The format for your next appointment:   In Person  Provider:   You may see Lance Muss, MD or one of the following Advanced Practice Providers on your designated Care Team:    Ronie Spies, PA-C  Jacolyn Reedy, PA-C

## 2020-12-05 NOTE — Telephone Encounter (Signed)
Called and spoke to patient. Reviewed information below with the patient. Patient denies having any S/Sx of fluid retention. He will let us know if that changes and we will send in Rx for PRN lasix. Instructed for patient to reach out to PCP re: nascent iodine. Patient verbalized understanding and thanked me for the call.

## 2020-12-09 ENCOUNTER — Telehealth: Payer: Self-pay | Admitting: Interventional Cardiology

## 2020-12-09 MED ORDER — ROSUVASTATIN CALCIUM 20 MG PO TABS: 20.0000 mg | ORAL_TABLET | Freq: Every day | ORAL | 1 refills | Status: DC

## 2020-12-09 NOTE — Telephone Encounter (Signed)
Pt called and stated he would like to take Dr Irish Lack recommendation and take the Crestor til next July to see what the effects are.     [facility] [place] [facility] [place]  [address] [place]) [place] [address]  Phone:  [phone] Fax:  [phone]   Best number [phone]

## 2020-12-09 NOTE — Telephone Encounter (Signed)
Darin Booze, MD  11/25/2020 2:39 PM EST      LDL still above target. WOuld consider a more potent statin like Crestor 20 mg daily if he was willing. Fluid status stable from last year.   Also. Mildly hypothyroid. WOuld cc his PMD, Dr. Orland Mustard, for further management     Spoke with the pt and informed him to stop pravastatin and start recommended statin of rosuvastatin 20 mg po daily, as recommended above by Dr. Irish Lack. Pt states he will take crestor up until he see's his PCP in July, where he will have a full panel of labs done for his yearly physical.  Pt states he will have his labs sent to Dr. Irish Lack at that time, to reassess his numbers,  while taking crestor. Confirmed the pharmacy of choice with the pt.  Advised the pt if he starts experiencing any increased side effects from crestor, to please call our office back, so we can let Dr. Irish Lack know this.  Pt verbalized understanding and agrees with this plan.  Pt was more than gracious for all the assistance provided.

## 2021-02-17 ENCOUNTER — Other Ambulatory Visit: Payer: Self-pay

## 2021-02-17 ENCOUNTER — Encounter: Payer: Self-pay | Admitting: Endocrinology

## 2021-02-17 ENCOUNTER — Ambulatory Visit (INDEPENDENT_AMBULATORY_CARE_PROVIDER_SITE_OTHER): Payer: Medicare Other | Admitting: Endocrinology

## 2021-02-17 DIAGNOSIS — E039 Hypothyroidism, unspecified: Secondary | ICD-10-CM | POA: Diagnosis not present

## 2021-02-17 DIAGNOSIS — R413 Other amnesia: Secondary | ICD-10-CM | POA: Diagnosis not present

## 2021-02-17 DIAGNOSIS — I2510 Atherosclerotic heart disease of native coronary artery without angina pectoris: Secondary | ICD-10-CM

## 2021-02-17 LAB — T4, FREE: Free T4: 0.84 ng/dL (ref 0.60–1.60)

## 2021-02-17 LAB — TSH: TSH: 3.38 u[IU]/mL (ref 0.35–4.50)

## 2021-02-17 LAB — VITAMIN B12: Vitamin B-12: 609 pg/mL (ref 211–911)

## 2021-02-17 NOTE — Progress Notes (Signed)
Subjective:    Patient ID: Darin Simmons, male    DOB: 06/18/1941, 80 y.o.   MRN: [id number]  HPI Pt is referred by Dr Orland Mustard, for hypothyroidism.  Pt reports hypothyroidism was dx'ed in 1/22.  He has never been on prescribed thyroid hormone therapy.  He has never taken kelp or any other type of non-prescribed thyroid product.  He has never had thyroid imaging.  He has never had thyroid surgery, or XRT to the neck.  He has never been on amiodarone or lithium.  He reports memory loss, fatigue, myalgias, cold intolerance, and depression.  He takes multiple supplements, including iodine.   Past Medical History:  Diagnosis Date  . 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 Aortic stenosis   . Arthritis   . Bilateral inguinal hernia 03/28/2012  . BPH (benign prostatic hyperplasia)   . CAD (coronary artery disease) 2004  . Dyspnea   . Heart attack ([place]) 2004  . Heart murmur   . Hypercholesterolemia   . 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 PVD (peripheral vascular disease) ([place])   . 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 Sternal osteomyelitis ([place])   . Umbilical hernia 06/29/8675  . Wound infection 04/2017    Past Surgical History:  Procedure Laterality Date  . AORTIC VALVE REPLACEMENT N/A 04/22/2017   Procedure: AORTIC VALVE REPLACEMENT (AVR) using Magna Ease size 2mm;  Surgeon: Gaye Pollack, MD;  Location: [facility];  Service: Open Heart Surgery;  Laterality: N/A;  . APPLICATION OF WOUND VAC N/A 05/13/2017   Procedure: WOUND VAC CHANGE;  Surgeon: Melrose Nakayama, MD;  Location: [facility];  Service: Thoracic;  Laterality: N/A;  . DENTAL SURGERY     removed  . hair follicle removed Left   . heart stent    . HERNIA REPAIR Bilateral 06/10/93   BIH and umbilical hernia repair  . INGUINAL HERNIA REPAIR Bilateral 01/18/2013   Procedure: LAPAROSCOPIC BILATERAL INGUINAL HERNIA REPAIR;  Surgeon: Odis Hollingshead, MD;  [facility];  Service: General;  Laterality:  Bilateral;  . INSERTION OF MESH Bilateral 01/18/2013   Procedure: INSERTION OF MESH;  Surgeon: Odis Hollingshead, MD;  [facility];  Service: General;  Laterality: Bilateral;  AND UMBILICUS  . STERNAL WOUND DEBRIDEMENT N/A 05/11/2017   Procedure: STERNAL WOUND IRRIGATION AND DEBRIDEMENT;  Surgeon: Melrose Nakayama, MD;  Location: [facility];  Service: Thoracic;  Laterality: N/A;  . TEE WITHOUT CARDIOVERSION N/A 04/22/2017   Procedure: TRANSESOPHAGEAL ECHOCARDIOGRAM (TEE);  Surgeon: Gaye Pollack, MD;  Location: [facility];  Service: Open Heart Surgery;  Laterality: N/A;  . UMBILICAL HERNIA REPAIR N/A 01/18/2013   Procedure: HERNIA REPAIR UMBILICAL ADULT;  Surgeon: Odis Hollingshead, MD;  [facility];  Service: General;  Laterality: N/A;    Social History   Socioeconomic History  . Marital status: Divorced    Spouse name: Not on file  . Number of children: Not on file  . Years of education: Not on file  . Highest education level: Not on file  Occupational History  . Not on file  Tobacco Use  . Smoking status: Former Smoker    Packs/day: 1.00    Years: 50.00    Pack years: 50.00    Types: Cigarettes    Quit date: 11/22/2008    Years since quitting: 12.2  . Smokeless tobacco: Never Used  Vaping Use  . Vaping Use: Never used  Substance  and Sexual Activity  . Alcohol use: No  . Drug use: No  . Sexual activity: Not on file  Other Topics Concern  . Not on file  Social History Narrative  . Not on file   Social Determinants of Health   Financial Resource Strain: Not on file  Food Insecurity: Not on file  Transportation Needs: Not on file  Physical Activity: Not on file  Stress: Not on file  Social Connections: Not on file  Intimate Partner Violence: Not on file    Current Outpatient Medications on File Prior to Visit  Medication Sig Dispense Refill  . amoxicillin (AMOXIL) 500 MG capsule TAKE FOUR CAPSULES BY MOUTH ONE HOUR BEFORE DENTAL PROCEDURES 12 capsule 3  .  nitroGLYCERIN (NITROSTAT) 0.4 MG SL tablet Place under the tongue every 5 (five) minutes as needed.     . rosuvastatin (CRESTOR) 20 MG tablet Take 1 tablet (20 mg total) by mouth daily. 90 tablet 1  . sildenafil (VIAGRA) 50 MG tablet Take 50 mg by mouth daily as needed.     No current facility-administered medications on file prior to visit.    Allergies  Allergen Reactions  . Bee Venom Shortness Of Breath and Swelling  . Crestor [Rosuvastatin] Other (See Comments)    MYALGIAS  . Diphenhydramine Hcl Other (See Comments)    Shaky legs  . Statins Other (See Comments)    MYALGIAS MUSCLE PAIN  . Diphenhydramine Hcl Other (See Comments)  . Other Other (See Comments)    Family History  Problem Relation Age of Onset  . Heart disease Father   . Thyroid disease Neg Hx     BP (!) 160/80 (BP Location: Right Arm, Patient Position: Sitting, Simmons Size: Normal)   Pulse 62   Ht 5\' 10"  (1.778 m)   Wt 152 lb 3.2 oz (69 kg)   SpO2 99%   BMI 21.84 kg/m     Review of Systems denies weight gain, constipation, numbness, and dry skin.      Objective:   Physical Exam VITAL SIGNS:  See vs page GENERAL: no distress NECK: There is no palpable thyroid enlargement.  No thyroid nodule is palpable.  No palpable lymphadenopathy at the anterior neck. SKIN: cool to touch  CT chest: no mention is made of the thyroid.   Lab Results  Component Value Date   TSH 5.490 (H) 11/24/2020   I have reviewed outside records, and summarized: Pt was noted to have elevated TSH, and referred here.  HTN, dyslipidemia, hearing loss, and wellness were also addressed.      Assessment & Plan:  Hypothyroidism, new to me.  We discussed.  he declines rx.    Patient Instructions  Your blood pressure is high today.  Please see your primary care provider soon, to have it rechecked The iodine pill might be harming the thyroid, so you should stop taking it. Blood tests are requested for you today.  We'll let you  know about the results.  Please come back for a follow-up appointment in 2 weeks.        Hypothyroidism  Hypothyroidism is when the thyroid gland does not make enough of certain hormones (it is underactive). The thyroid gland is a small gland located in the lower front part of the neck, just in front of the windpipe (trachea). This gland makes hormones that help control how the body uses food for energy (metabolism) as well as how the heart and brain function. These hormones also play a  role in keeping your bones strong. When the thyroid is underactive, it produces too little of the hormones thyroxine (T4) and triiodothyronine (T3). What are the causes? This condition may be caused by:  Hashimoto's disease. This is a disease in which the body's disease-fighting system (immune system) attacks the thyroid gland. This is the most common cause.  Viral infections.  Pregnancy.  Certain medicines.  Birth defects.  Past radiation treatments to the head or neck for cancer.  Past treatment with radioactive iodine.  Past exposure to radiation in the environment.  Past surgical removal of part or all of the thyroid.  Problems with a gland in the center of the brain (pituitary gland).  Lack of enough iodine in the diet. What increases the risk? You are more likely to develop this condition if:  You are male.  You have a family history of thyroid conditions.  You use a medicine called lithium.  You take medicines that affect the immune system (immunosuppressants). What are the signs or symptoms? Symptoms of this condition include:  Feeling as though you have no energy (lethargy).  Not being able to tolerate cold.  Weight gain that is not explained by a change in diet or exercise habits.  Lack of appetite.  Dry skin.  Coarse hair.  Menstrual irregularity.  Slowing of thought processes.  Constipation.  Sadness or depression. How is this diagnosed? This condition  may be diagnosed based on:  Your symptoms, your medical history, and a physical exam.  Blood tests. You may also have imaging tests, such as an ultrasound or MRI. How is this treated? This condition is treated with medicine that replaces the thyroid hormones that your body does not make. After you begin treatment, it may take several weeks for symptoms to go away. Follow these instructions at home:  Take over-the-counter and prescription medicines only as told by your health care provider.  If you start taking any new medicines, tell your health care provider.  Keep all follow-up visits as told by your health care provider. This is important. ? As your condition improves, your dosage of thyroid hormone medicine may change. ? You will need to have blood tests regularly so that your health care provider can monitor your condition. Contact a health care provider if:  Your symptoms do not get better with treatment.  You are taking thyroid hormone replacement medicine and you: ? Sweat a lot. ? Have tremors. ? Feel anxious. ? Lose weight rapidly. ? Cannot tolerate heat. ? Have emotional swings. ? Have diarrhea. ? Feel weak. Get help right away if you have:  Chest pain.  An irregular heartbeat.  A rapid heartbeat.  Difficulty breathing. Summary  Hypothyroidism is when the thyroid gland does not make enough of certain hormones (it is underactive).  When the thyroid is underactive, it produces too little of the hormones thyroxine (T4) and triiodothyronine (T3).  The most common cause is Hashimoto's disease, a disease in which the body's disease-fighting system (immune system) attacks the thyroid gland. The condition can also be caused by viral infections, medicine, pregnancy, or past radiation treatment to the head or neck.  Symptoms may include weight gain, dry skin, constipation, feeling as though you do not have energy, and not being able to tolerate cold.  This condition  is treated with medicine to replace the thyroid hormones that your body does not make. This information is not intended to replace advice given to you by your health care provider. Make sure you  discuss any questions you have with your health care provider. Document Revised: 08/08/2020 Document Reviewed: 07/24/2020 Elsevier Patient Education  2021 [employer].

## 2021-02-17 NOTE — Patient Instructions (Addendum)
Your blood pressure is high today.  Please see your primary care provider soon, to have it rechecked The iodine pill might be harming the thyroid, so you should stop taking it. Blood tests are requested for you today.  We'll let you know about the results.  Please come back for a follow-up appointment in 2 weeks.        Hypothyroidism  Hypothyroidism is when the thyroid gland does not make enough of certain hormones (it is underactive). The thyroid gland is a small gland located in the lower front part of the neck, just in front of the windpipe (trachea). This gland makes hormones that help control how the body uses food for energy (metabolism) as well as how the heart and brain function. These hormones also play a role in keeping your bones strong. When the thyroid is underactive, it produces too little of the hormones thyroxine (T4) and triiodothyronine (T3). What are the causes? This condition may be caused by:  Hashimoto's disease. This is a disease in which the body's disease-fighting system (immune system) attacks the thyroid gland. This is the most common cause.  Viral infections.  Pregnancy.  Certain medicines.  Birth defects.  Past radiation treatments to the head or neck for cancer.  Past treatment with radioactive iodine.  Past exposure to radiation in the environment.  Past surgical removal of part or all of the thyroid.  Problems with a gland in the center of the brain (pituitary gland).  Lack of enough iodine in the diet. What increases the risk? You are more likely to develop this condition if:  You are male.  You have a family history of thyroid conditions.  You use a medicine called lithium.  You take medicines that affect the immune system (immunosuppressants). What are the signs or symptoms? Symptoms of this condition include:  Feeling as though you have no energy (lethargy).  Not being able to tolerate cold.  Weight gain that is not explained  by a change in diet or exercise habits.  Lack of appetite.  Dry skin.  Coarse hair.  Menstrual irregularity.  Slowing of thought processes.  Constipation.  Sadness or depression. How is this diagnosed? This condition may be diagnosed based on:  Your symptoms, your medical history, and a physical exam.  Blood tests. You may also have imaging tests, such as an ultrasound or MRI. How is this treated? This condition is treated with medicine that replaces the thyroid hormones that your body does not make. After you begin treatment, it may take several weeks for symptoms to go away. Follow these instructions at home:  Take over-the-counter and prescription medicines only as told by your health care provider.  If you start taking any new medicines, tell your health care provider.  Keep all follow-up visits as told by your health care provider. This is important. ? As your condition improves, your dosage of thyroid hormone medicine may change. ? You will need to have blood tests regularly so that your health care provider can monitor your condition. Contact a health care provider if:  Your symptoms do not get better with treatment.  You are taking thyroid hormone replacement medicine and you: ? Sweat a lot. ? Have tremors. ? Feel anxious. ? Lose weight rapidly. ? Cannot tolerate heat. ? Have emotional swings. ? Have diarrhea. ? Feel weak. Get help right away if you have:  Chest pain.  An irregular heartbeat.  A rapid heartbeat.  Difficulty breathing. Summary  Hypothyroidism is when  the thyroid gland does not make enough of certain hormones (it is underactive).  When the thyroid is underactive, it produces too little of the hormones thyroxine (T4) and triiodothyronine (T3).  The most common cause is Hashimoto's disease, a disease in which the body's disease-fighting system (immune system) attacks the thyroid gland. The condition can also be caused by viral  infections, medicine, pregnancy, or past radiation treatment to the head or neck.  Symptoms may include weight gain, dry skin, constipation, feeling as though you do not have energy, and not being able to tolerate cold.  This condition is treated with medicine to replace the thyroid hormones that your body does not make. This information is not intended to replace advice given to you by your health care provider. Make sure you discuss any questions you have with your health care provider. Document Revised: 08/08/2020 Document Reviewed: 07/24/2020 Elsevier Patient Education  2021 [employer].

## 2021-02-26 ENCOUNTER — Ambulatory Visit (INDEPENDENT_AMBULATORY_CARE_PROVIDER_SITE_OTHER): Payer: Medicare Other | Admitting: Endocrinology

## 2021-02-26 ENCOUNTER — Other Ambulatory Visit: Payer: Self-pay

## 2021-02-26 VITALS — BP 144/60 | HR 63 | Ht 70.0 in | Wt 153.0 lb

## 2021-02-26 DIAGNOSIS — E039 Hypothyroidism, unspecified: Secondary | ICD-10-CM | POA: Diagnosis not present

## 2021-02-26 DIAGNOSIS — I2510 Atherosclerotic heart disease of native coronary artery without angina pectoris: Secondary | ICD-10-CM | POA: Diagnosis not present

## 2021-02-26 NOTE — Progress Notes (Signed)
Subjective:    Patient ID: Darin Simmons, male    DOB: 1941/02/17, 80 y.o.   MRN: [id number]  HPI  Pt returns for f/u of hypothyroidism (dx'ed 1/22; he has never been on prescribed thyroid hormone therapy; he has never had thyroid imaging; he declines rx).  Since last ov, pt states he feels better in general.   Past Medical History:  Diagnosis Date  . 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 Aortic stenosis   . Arthritis   . Bilateral inguinal hernia 03/28/2012  . BPH (benign prostatic hyperplasia)   . CAD (coronary artery disease) 2004  . Dyspnea   . Heart attack ([place]) 2004  . Heart murmur   . Hypercholesterolemia   . 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 PVD (peripheral vascular disease) ([place])   . 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 Umbilical hernia 04/24/8452  . Wound infection 04/2017    Past Surgical History:  Procedure Laterality Date  . AORTIC VALVE REPLACEMENT N/A 04/22/2017   Procedure: AORTIC VALVE REPLACEMENT (AVR) using Magna Ease size 11mm;  Surgeon: Gaye Pollack, MD;  Location: [facility];  Service: Open Heart Surgery;  Laterality: N/A;  . APPLICATION OF WOUND VAC N/A 05/13/2017   Procedure: WOUND VAC CHANGE;  Surgeon: Melrose Nakayama, MD;  Location: [facility];  Service: Thoracic;  Laterality: N/A;  . DENTAL SURGERY     removed  . hair follicle removed Left   . heart stent    . HERNIA REPAIR Bilateral 6/46/80   BIH and umbilical hernia repair  . INGUINAL HERNIA REPAIR Bilateral 01/18/2013   Procedure: LAPAROSCOPIC BILATERAL INGUINAL HERNIA REPAIR;  Surgeon: Odis Hollingshead, MD;  Location: [facility];  Service: General;  Laterality: Bilateral;  . INSERTION OF MESH Bilateral 01/18/2013   Procedure: INSERTION OF MESH;  Surgeon: Odis Hollingshead, MD;  Location: [facility];  Service: General;  Laterality: Bilateral;  AND UMBILICUS  . STERNAL WOUND DEBRIDEMENT N/A 05/11/2017   Procedure: STERNAL WOUND IRRIGATION  AND DEBRIDEMENT;  Surgeon: Melrose Nakayama, MD;  Location: [facility];  Service: Thoracic;  Laterality: N/A;  . TEE WITHOUT CARDIOVERSION N/A 04/22/2017   Procedure: TRANSESOPHAGEAL ECHOCARDIOGRAM (TEE);  Surgeon: Gaye Pollack, MD;  Location: [facility];  Service: Open Heart Surgery;  Laterality: N/A;  . UMBILICAL HERNIA REPAIR N/A 01/18/2013   Procedure: HERNIA REPAIR UMBILICAL ADULT;  Surgeon: Odis Hollingshead, MD;  Location: [facility];  Service: General;  Laterality: N/A;    Social History   Socioeconomic History  . Marital status: Divorced    Spouse name: Not on file  . Number of children: Not on file  . Years of education: Not on file  . Highest education level: Not on file  Occupational History  . Not on file  Tobacco Use  . Smoking status: Former Smoker    Packs/day: 1.00    Years: 50.00    Pack years: 50.00    Types: Cigarettes    Quit date: 11/22/2008    Years since quitting: 12.2  . Smokeless tobacco: Never Used  Vaping Use  . Vaping Use: Never used  Substance and Sexual Activity  . Alcohol use: No  . Drug use: No  . Sexual activity: Not on file  Other Topics Concern  . Not on file  Social History Narrative  . Not on file   Social Determinants of Health   Financial Resource  Strain: Not on file  Food Insecurity: Not on file  Transportation Needs: Not on file  Physical Activity: Not on file  Stress: Not on file  Social Connections: Not on file  Intimate Partner Violence: Not on file    Current Outpatient Medications on File Prior to Visit  Medication Sig Dispense Refill  . amoxicillin (AMOXIL) 500 MG capsule TAKE FOUR CAPSULES BY MOUTH ONE HOUR BEFORE DENTAL PROCEDURES 12 capsule 3  . nitroGLYCERIN (NITROSTAT) 0.4 MG SL tablet Place under the tongue every 5 (five) minutes as needed.     . rosuvastatin (CRESTOR) 20 MG tablet Take 1 tablet (20 mg total) by mouth daily. 90 tablet 1  . sildenafil (VIAGRA) 50 MG tablet Take 50 mg by mouth daily as needed.     No  current facility-administered medications on file prior to visit.    Allergies  Allergen Reactions  . Bee Venom Shortness Of Breath and Swelling  . Crestor [Rosuvastatin] Other (See Comments)    MYALGIAS  . Diphenhydramine Hcl Other (See Comments)    Shaky legs  . Statins Other (See Comments)    MYALGIAS MUSCLE PAIN  . Diphenhydramine Hcl Other (See Comments)  . Other Other (See Comments)    Family History  Problem Relation Age of Onset  . Heart disease Father   . Thyroid disease Neg Hx     BP (!) 144/60 (BP Location: Right Arm, Patient Position: Sitting, Cuff Size: Normal)   Pulse 63   Ht 5\' 10"  (1.778 m)   Wt 153 lb (69.4 kg)   SpO2 99%   BMI 21.95 kg/m     Review of Systems     Objective:   Physical Exam VITAL SIGNS:  See vs page GENERAL: no distress NECK: There is no palpable thyroid enlargement.  No thyroid nodule is palpable.  No palpable lymphadenopathy at the anterior neck.  Lab Results  Component Value Date   TSH 3.38 02/17/2021       Assessment & Plan:  Hypothyroidism: stable.  I told pt is up to him if he wants to take synthroid.  However, this will progress with time.

## 2021-02-26 NOTE — Patient Instructions (Addendum)
No medication is needed for the thyroid now.   You should have the blood test checked 1-2 time per year. I would be happy to see you back here as needed.

## 2021-06-16 DIAGNOSIS — E7800 Pure hypercholesterolemia, unspecified: Secondary | ICD-10-CM | POA: Diagnosis not present

## 2021-06-16 DIAGNOSIS — Z0000 Encounter for general adult medical examination without abnormal findings: Secondary | ICD-10-CM | POA: Diagnosis not present

## 2021-06-16 DIAGNOSIS — Z125 Encounter for screening for malignant neoplasm of prostate: Secondary | ICD-10-CM | POA: Diagnosis not present

## 2021-06-16 DIAGNOSIS — R300 Dysuria: Secondary | ICD-10-CM | POA: Diagnosis not present

## 2021-06-16 DIAGNOSIS — I2510 Atherosclerotic heart disease of native coronary artery without angina pectoris: Secondary | ICD-10-CM | POA: Diagnosis not present

## 2021-06-18 DIAGNOSIS — R197 Diarrhea, unspecified: Secondary | ICD-10-CM | POA: Diagnosis not present

## 2021-06-18 DIAGNOSIS — N529 Male erectile dysfunction, unspecified: Secondary | ICD-10-CM | POA: Diagnosis not present

## 2021-06-18 DIAGNOSIS — R030 Elevated blood-pressure reading, without diagnosis of hypertension: Secondary | ICD-10-CM | POA: Diagnosis not present

## 2021-06-18 DIAGNOSIS — Z0000 Encounter for general adult medical examination without abnormal findings: Secondary | ICD-10-CM | POA: Diagnosis not present

## 2021-06-18 DIAGNOSIS — I739 Peripheral vascular disease, unspecified: Secondary | ICD-10-CM | POA: Diagnosis not present

## 2021-06-18 DIAGNOSIS — I2510 Atherosclerotic heart disease of native coronary artery without angina pectoris: Secondary | ICD-10-CM | POA: Diagnosis not present

## 2021-06-18 DIAGNOSIS — E7800 Pure hypercholesterolemia, unspecified: Secondary | ICD-10-CM | POA: Diagnosis not present

## 2021-07-11 DIAGNOSIS — Z1211 Encounter for screening for malignant neoplasm of colon: Secondary | ICD-10-CM | POA: Diagnosis not present

## 2021-07-11 DIAGNOSIS — Z1212 Encounter for screening for malignant neoplasm of rectum: Secondary | ICD-10-CM | POA: Diagnosis not present

## 2021-07-14 ENCOUNTER — Other Ambulatory Visit (HOSPITAL_COMMUNITY): Payer: Self-pay | Admitting: Family Medicine

## 2021-07-14 ENCOUNTER — Ambulatory Visit (HOSPITAL_COMMUNITY)
Admission: RE | Admit: 2021-07-14 | Discharge: 2021-07-14 | Disposition: A | Discharge status: Home / Self Care | Payer: Medicare Other | Source: Ambulatory Visit | Attending: Family Medicine | Admitting: Family Medicine

## 2021-07-14 ENCOUNTER — Other Ambulatory Visit: Payer: Self-pay

## 2021-07-14 DIAGNOSIS — M7989 Other specified soft tissue disorders: Secondary | ICD-10-CM

## 2021-07-14 DIAGNOSIS — R946 Abnormal results of thyroid function studies: Secondary | ICD-10-CM | POA: Diagnosis not present

## 2021-08-25 DIAGNOSIS — Z23 Encounter for immunization: Secondary | ICD-10-CM | POA: Diagnosis not present

## 2021-08-25 DIAGNOSIS — H40033 Anatomical narrow angle, bilateral: Secondary | ICD-10-CM | POA: Diagnosis not present

## 2021-08-25 DIAGNOSIS — H2513 Age-related nuclear cataract, bilateral: Secondary | ICD-10-CM | POA: Diagnosis not present

## 2021-09-15 DIAGNOSIS — Z23 Encounter for immunization: Secondary | ICD-10-CM | POA: Diagnosis not present

## 2021-09-22 DIAGNOSIS — U071 COVID-19: Secondary | ICD-10-CM | POA: Diagnosis not present

## 2021-12-10 DIAGNOSIS — C180 Malignant neoplasm of cecum: Secondary | ICD-10-CM | POA: Diagnosis not present

## 2021-12-10 DIAGNOSIS — D123 Benign neoplasm of transverse colon: Secondary | ICD-10-CM | POA: Diagnosis not present

## 2021-12-10 DIAGNOSIS — K642 Third degree hemorrhoids: Secondary | ICD-10-CM | POA: Diagnosis not present

## 2021-12-10 DIAGNOSIS — R195 Other fecal abnormalities: Secondary | ICD-10-CM | POA: Diagnosis not present

## 2021-12-17 DIAGNOSIS — D123 Benign neoplasm of transverse colon: Secondary | ICD-10-CM | POA: Diagnosis not present

## 2021-12-17 DIAGNOSIS — C180 Malignant neoplasm of cecum: Secondary | ICD-10-CM | POA: Diagnosis not present

## 2021-12-23 ENCOUNTER — Other Ambulatory Visit: Payer: Self-pay | Admitting: Gastroenterology

## 2021-12-23 DIAGNOSIS — C180 Malignant neoplasm of cecum: Secondary | ICD-10-CM

## 2021-12-24 ENCOUNTER — Ambulatory Visit
Admission: RE | Admit: 2021-12-24 | Discharge: 2021-12-24 | Disposition: A | Discharge status: Home / Self Care | Payer: Medicare Other | Source: Ambulatory Visit | Attending: Gastroenterology | Admitting: Gastroenterology

## 2021-12-24 ENCOUNTER — Other Ambulatory Visit: Payer: Self-pay | Admitting: Gastroenterology

## 2021-12-24 DIAGNOSIS — C180 Malignant neoplasm of cecum: Secondary | ICD-10-CM

## 2021-12-24 DIAGNOSIS — J439 Emphysema, unspecified: Secondary | ICD-10-CM | POA: Diagnosis not present

## 2021-12-24 DIAGNOSIS — I700 Atherosclerosis of aorta: Secondary | ICD-10-CM | POA: Diagnosis not present

## 2021-12-24 DIAGNOSIS — N3289 Other specified disorders of bladder: Secondary | ICD-10-CM | POA: Diagnosis not present

## 2021-12-24 DIAGNOSIS — I2510 Atherosclerotic heart disease of native coronary artery without angina pectoris: Secondary | ICD-10-CM | POA: Diagnosis not present

## 2021-12-24 DIAGNOSIS — K6389 Other specified diseases of intestine: Secondary | ICD-10-CM | POA: Diagnosis not present

## 2021-12-24 DIAGNOSIS — K769 Liver disease, unspecified: Secondary | ICD-10-CM | POA: Diagnosis not present

## 2021-12-24 MED ORDER — IOPAMIDOL (ISOVUE-300) INJECTION 61%
100.0000 mL | Freq: Once | INTRAVENOUS | Status: AC | PRN
  Administered 2021-12-24: 100 mL via INTRAVENOUS

## 2021-12-29 ENCOUNTER — Other Ambulatory Visit: Payer: Medicare Other

## 2022-01-04 ENCOUNTER — Ambulatory Visit: Payer: Self-pay | Admitting: General Surgery

## 2022-01-04 DIAGNOSIS — C182 Malignant neoplasm of ascending colon: Secondary | ICD-10-CM | POA: Diagnosis not present

## 2022-01-04 NOTE — H&P (Signed)
REFERRING PHYSICIAN:  Arta Silence, MD  PROVIDER:  Monico Blitz, MD  MRN: [id number] DOB: Nov 05, 1941 DATE OF ENCOUNTER: 01/04/2022  Subjective  Chief Complaint: consult     History of Present Illness: Darin Simmons is a 81 y.o. male who is seen today as an office consultation at the request of Dr. Paulita Fujita for evaluation of consult .  81 year old male with history of aortic valve replacement who presents to the office for evaluation of a new colon mass.  Patient was noted to have diarrhea that lasted for several months and approximately 10 pound weight loss.  He was noted to have a 15 mm polyp mid transverse colon which was removed and retrieved completely.  Congested mucosa in the cecum was also biopsied.  Transverse colon polyp was sessile serrated adenoma with low-grade dysplasia.  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  CT scans of chest abdomen and pelvis were performed and showed no evidence of metastatic disease.  Review of Systems: A complete review of systems was obtained from the patient.  I have reviewed this information and discussed as appropriate with the patient.  See HPI as well for other ROS.   Medical History: Past Medical History: Diagnosis Date  Anxiety   Arrhythmia   CHF (congestive heart failure) (CMS-HCC)   Heart valve disease   History of cancer   Thyroid disease    There is no problem list on file for this patient.   Past Surgical History: Procedure Laterality Date  aortic valve replacement surgery N/A   HERNIA REPAIR    stent placement surgery N/A     Allergies Allergen Reactions  Venom-Honey Bee Shortness Of Breath and Swelling  Diphenhydramine Hcl Other (See Comments)   Shaky legs   Rosuvastatin Other (See Comments)   MYALGIAS   Statins-Hmg-Coa Reductase Inhibitors Muscle Pain and Other (See Comments)   MYALGIAS MUSCLE PAIN   Bee Sting Kit Swelling  Other Other (See Comments)   No current outpatient  medications on file prior to visit.  No current facility-administered medications on file prior to visit.   Family History Problem Relation Age of Onset  Heart valve disease Father   High blood pressure (Hypertension) Brother   Heart valve disease Brother     Social History  Tobacco Use Smoking Status Former  Types: Cigarettes Smokeless Tobacco Never    Social History  Socioeconomic History  Marital status: Married Tobacco Use  Smoking status: Former   Types: Cigarettes  Smokeless tobacco: Never Vaping Use  Vaping Use: Never used Substance and Sexual Activity  Alcohol use: Never  Drug use: Never   Objective:   Vitals:  01/04/22 0930 BP: 138/64 Pulse: 77 Temp: 37.1 C (98.7 F) SpO2: 99% Weight: 63.3 kg (139 lb 9.6 oz) Height: 177.8 cm (5' 10")    Exam Gen: NAD Abd: soft    Labs, Imaging and Diagnostic Testing: CT scan chest abdomen and pelvis performed 12/24/2021.  Mild cecal wall thickening.  No evidence of metastatic disease.  Bladder wall thickening and prostatomegaly noted  Assessment and Plan: Diagnoses and all orders for this visit:  Malignant neoplasm of ascending colon (CMS-HCC) -     polyethylene glycol (MIRALAX) powder; Take 233.75 g by mouth once for 1 dose Take according to your procedure prep instructions. -     bisacodyL (DULCOLAX) 5 mg EC tablet; Take 4 tablets (20 mg total) by mouth once for 1 dose -     metroNIDAZOLE (FLAGYL) 500 MG tablet; Take 2  tablets (1,000 mg total) by mouth 3 (three) times daily for 6 doses Take according to your procedure colon prep instructions  81 year old male who appears to have a poorly differentiated cecal adenocarcinoma.  I have recommended robotic assisted right colectomy.  We have discussed this in detail.  Patient is relatively healthy except for some chronic prostate issues.  He has a history of heart surgery and heart disease in the past but has not had any major issues in the past several years.  All  questions were answered.  Patient agrees to proceed with surgery.  The surgery and anatomy were described to the patient as well as the risks of surgery and the possible complications.  These include: Bleeding, deep abdominal infections and possible wound complications such as hernia and infection, damage to adjacent structures, leak of surgical connections, which can lead to other surgeries and possibly an ostomy, possible need for other procedures, such as abscess drains in radiology, possible prolonged hospital stay, possible diarrhea from removal of part of the colon, possible constipation from narcotics, possible bowel, bladder or sexual dysfunction if having rectal surgery, prolonged fatigue/weakness or appetite loss, possible early recurrence of of disease, possible complications of their medical problems such as heart disease or arrhythmias or lung problems, death (less than 1%). I believe the patient understands and wishes to proceed with the surgery.

## 2022-01-04 NOTE — H&P (View-Only) (Signed)
° °REFERRING PHYSICIAN:  Outlaw, William, MD ° °PROVIDER:  Vickee Mormino CHRISTINE Jhalen Eley, MD ° °MRN: [id number] °DOB: 07/20/1941 °DATE OF ENCOUNTER: 01/04/2022 ° °Subjective ° °Chief Complaint: consult °  ° ° °History of Present Illness: °Darin Simmons is a 80 y.o. male who is seen today as an office consultation at the request of Dr. Outlaw for evaluation of consult °.  80-year-old male with history of aortic valve replacement who presents to the office for evaluation of a new colon mass.  Patient was noted to have diarrhea that lasted for several months and approximately 10 pound weight loss.  He was noted to have a 15 mm polyp mid transverse colon which was removed and retrieved completely.  Congested mucosa in the cecum was also biopsied.  Transverse colon polyp was sessile serrated adenoma with low-grade dysplasia.  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  CT scans of chest abdomen and pelvis were performed and showed no evidence of metastatic disease. ° °Review of Systems: °A complete review of systems was obtained from the patient.  I have reviewed this information and discussed as appropriate with the patient.  See HPI as well for other ROS. ° ° °Medical History: °Past Medical History: °Diagnosis Date ° Anxiety  ° Arrhythmia  ° CHF (congestive heart failure) (CMS-HCC)  ° Heart valve disease  ° History of cancer  ° Thyroid disease  ° ° °There is no problem list on file for this patient. ° ° °Past Surgical History: °Procedure Laterality Date ° aortic valve replacement surgery N/A  ° HERNIA REPAIR   ° stent placement surgery N/A  °  ° °Allergies °Allergen Reactions ° Venom-Honey Bee Shortness Of Breath and Swelling ° Diphenhydramine Hcl Other (See Comments) °  Shaky legs ° ° Rosuvastatin Other (See Comments) °  MYALGIAS ° ° Statins-Hmg-Coa Reductase Inhibitors Muscle Pain and Other (See Comments) °  MYALGIAS °MUSCLE PAIN ° ° Bee Sting Kit Swelling ° Other Other (See Comments) ° ° °No current outpatient  medications on file prior to visit. ° °No current facility-administered medications on file prior to visit. ° ° °Family History °Problem Relation Age of Onset ° Heart valve disease Father  ° High blood pressure (Hypertension) Brother  ° Heart valve disease Brother  °  ° °Social History ° °Tobacco Use °Smoking Status Former ° Types: Cigarettes °Smokeless Tobacco Never °  ° °Social History ° °Socioeconomic History ° Marital status: Married °Tobacco Use ° Smoking status: Former °  Types: Cigarettes ° Smokeless tobacco: Never °Vaping Use ° Vaping Use: Never used °Substance and Sexual Activity ° Alcohol use: Never ° Drug use: Never ° ° °Objective: ° ° °Vitals: ° 01/04/22 0930 °BP: 138/64 °Pulse: 77 °Temp: 37.1 °C (98.7 °F) °SpO2: 99% °Weight: 63.3 kg (139 lb 9.6 oz) °Height: 177.8 cm (5' 10") °  ° °Exam °Gen: NAD °Abd: soft ° ° ° °Labs, Imaging and Diagnostic Testing: °CT scan chest abdomen and pelvis performed 12/24/2021.  Mild cecal wall thickening.  No evidence of metastatic disease.  Bladder wall thickening and prostatomegaly noted ° °Assessment and Plan: °Diagnoses and all orders for this visit: ° °Malignant neoplasm of ascending colon (CMS-HCC) °-     polyethylene glycol (MIRALAX) powder; Take 233.75 g by mouth once for 1 dose Take according to your procedure prep instructions. °-     bisacodyL (DULCOLAX) 5 mg EC tablet; Take 4 tablets (20 mg total) by mouth once for 1 dose °-     metroNIDAZOLE (FLAGYL) 500 MG tablet; Take 2 tablets (  tablets (1,000 mg total) by mouth 3 (three) times daily for 6 doses Take according to your procedure colon prep instructions  81 year old male who appears to have a poorly differentiated cecal adenocarcinoma.  I have recommended robotic assisted right colectomy.  We have discussed this in detail.  Patient is relatively healthy except for some chronic prostate issues.  He has a history of heart surgery and heart disease in the past but has not had any major issues in the past several years.  All  questions were answered.  Patient agrees to proceed with surgery.  The surgery and anatomy were described to the patient as well as the risks of surgery and the possible complications.  These include: Bleeding, deep abdominal infections and possible wound complications such as hernia and infection, damage to adjacent structures, leak of surgical connections, which can lead to other surgeries and possibly an ostomy, possible need for other procedures, such as abscess drains in radiology, possible prolonged hospital stay, possible diarrhea from removal of part of the colon, possible constipation from narcotics, possible bowel, bladder or sexual dysfunction if having rectal surgery, prolonged fatigue/weakness or appetite loss, possible early recurrence of of disease, possible complications of their medical problems such as heart disease or arrhythmias or lung problems, death (less than 1%). I believe the patient understands and wishes to proceed with the surgery.

## 2022-01-05 ENCOUNTER — Telehealth: Payer: Self-pay | Admitting: *Deleted

## 2022-01-05 NOTE — Telephone Encounter (Signed)
S/w the pt and he is agreeable to plan of care for pre op appt. Pt has been scheduled to see Ermalinda Barrios, [facility] for pre op clearance @ 8:45 01/19/22. I will forward notes to [facility] for the upcoming appt. Will send FYI to requesting office the pt has appt

## 2022-01-05 NOTE — Telephone Encounter (Signed)
° °  Name: Darin Simmons  DOB: 1941-10-16  MRN: [id number]  Primary Cardiologist: Larae Grooms, MD  Chart reviewed as part of pre-operative protocol coverage. Because of Riddik P Vales's past medical history and time since last visit, he will require a follow-up visit in order to better assess preoperative cardiovascular risk.  Pre-op covering staff: - Please schedule appointment and call patient to inform them. If patient already had an upcoming appointment within acceptable timeframe, please add "pre-op clearance" to the appointment notes so provider is aware. - Please contact requesting surgeon's office via preferred method (i.e, phone, fax) to inform them of need for appointment prior to surgery.  If applicable, this message will also be routed to pharmacy pool and/or primary cardiologist for input on holding anticoagulant/antiplatelet agent as requested below so that this information is available to the clearing provider at time of patient's appointment.   Mable Fill, Marissa Nestle, NP  01/05/2022, 1:03 PM

## 2022-01-05 NOTE — Telephone Encounter (Addendum)
° °  Pre-operative Risk Assessment    Patient Name: Darin Simmons  DOB: 1941/06/13 MRN: [id number]      Request for Surgical Clearance    Procedure:  PER DR. Leighton Ruff PROCEDURE IS ROBOTIC PARTIAL COLECTOMY  Date of Surgery:  Clearance 01/27/22                                 Surgeon:  DR. Leighton Ruff Surgeon's Group or Practice Name:  [facility] Phone number:  [phone] Fax number:  [phone] ATTN: Mammie Lorenzo, LPN   Type of Clearance Requested:   - Medical    Type of Anesthesia:  General    Additional requests/questions:    Jiles Prows   01/05/2022, 11:09 AM

## 2022-01-06 NOTE — Telephone Encounter (Addendum)
Thank you Dr. Marcello Moores for the update as to procedure with be robotically performed. Clearance request had stated as laparoscopic Cholecystectomy.   I have corrected the clearance the request to show correct procedure.

## 2022-01-06 NOTE — Patient Instructions (Signed)
DUE TO COVID-19 ONLY ONE VISITOR IS ALLOWED TO COME WITH YOU AND STAY IN THE WAITING ROOM ONLY DURING PRE OP AND PROCEDURE.   **NO VISITORS ARE ALLOWED IN THE SHORT STAY AREA OR RECOVERY ROOM!!**  IF YOU WILL BE ADMITTED INTO THE HOSPITAL YOU ARE ALLOWED ONLY TWO SUPPORT PEOPLE DURING VISITATION HOURS ONLY (7 AM -8PM)   The support person(s) must pass our screening, gel in and out, and wear a mask at all times, including in the patients room. Patients must also wear a mask when staff or their support person are in the room. Visitors GUEST BADGE MUST BE WORN VISIBLY  One adult visitor may remain with you overnight and MUST be in the room by 8 P.M.  No visitors under the age of 79. Any visitor under the age of 70 must be accompanied by an adult.    COVID SWAB TESTING MUST BE COMPLETED ON: 01/25/22   Site: [facility] [place] Enter: Main Entrance have a seat in the waiting area to the right of main entrance (DO NOT [place]!!!!!) Dial: [phone] to alert staff you have arrived  You are not required to quarantine, however you are required to wear a well-fitted mask when you are out and around people not in your household.  Hand Hygiene often Do NOT share personal items Notify your provider if you are in close contact with someone who has COVID or you develop fever 100.4 or greater, new onset of sneezing, cough, sore throat, shortness of breath or body aches.  [place], [address], must go inside of the hospital, NOT A DRIVE THRU!  (Must self quarantine after testing. Follow instructions on handout.)       Your procedure is scheduled on: 01/27/22   Report to [facility] Main Entrance    Report to admitting at: 10:45 AM   Call this number if you have problems the morning of surgery [phone]  CLEAR LIQUID DIET: STARTING THE DAY BEFORE SURGERY UNTIL: 10:00 AM. DRINK  PLENTY FLUIDS WHEN DOING THE PREP.  Foods Allowed                                                                     Foods Excluded  Water, Black Coffee and tea, regular and decaf                             liquids that you cannot  Plain Jell-O in any flavor  (No red)                                           see through such as: Fruit ices (not with fruit pulp)                                     milk, soups, orange juice              Iced Popsicles (No  red)                                    All solid food                                   Apple juices Sports drinks like Gatorade (No red) Lightly seasoned clear broth or consume(fat free) Sugar Sample Menu Breakfast                                Lunch                                     Supper Cranberry juice                    Beef broth                            Chicken broth Jell-O                                     Grape juice                           Apple juice Coffee or tea                        Jell-O                                      Popsicle                                                Coffee or tea                        Coffee or tea  DRINK 2 PRESURGERY ENSURE DRINKS THE NIGHT BEFORE SURGERY AT  1000 PM AND 1 PRESURGERY DRINK THE DAY OF THE PROCEDURE 3 HOURS PRIOR TO SCHEDULED SURGERY. NO SOLIDS AFTER MIDNIGHT THE DAY PRIOR TO THE SURGERY. NOTHING BY MOUTH EXCEPT CLEAR LIQUIDS UNTIL THREE HOURS PRIOR TO SCHEDULED SURGERY. PLEASE FINISH PRESURGERY ENSURE DRINK PER SURGEON ORDER 3 HOURS PRIOR TO SCHEDULED SURGERY TIME WHICH NEEDS TO BE COMPLETED AT: 10:00 AM.    The day of surgery:  Drink ONE (1) Pre-Surgery Clear Ensure or G2 at AM the morning of surgery. Drink in one sitting. Do not sip.  This drink was given to you during your hospital  pre-op appointment visit. Nothing else to drink after completing the  Pre-Surgery Clear Ensure or G2.          If you have questions, please contact your surgeons  office.  FOLLOW BOWEL PREP AND ANY ADDITIONAL PRE OP INSTRUCTIONS YOU RECEIVED FROM YOUR SURGEON'S OFFICE!!!    Oral Hygiene is also important to reduce your risk of  infection.                                    Remember - BRUSH YOUR TEETH THE MORNING OF SURGERY WITH YOUR REGULAR TOOTHPASTE   Do NOT smoke after Midnight   Take these medicines the morning of surgery with A SIP OF WATER: N/A  DO NOT TAKE ANY ORAL DIABETIC MEDICATIONS DAY OF YOUR SURGERY                              You may not have any metal on your body including hair pins, jewelry, and body piercing             Do not wear lotions, powders, perfumes/cologne, or deodorant              Men may shave face and neck.   Do not bring valuables to the hospital. [place].   Contacts, dentures or bridgework may not be worn into surgery.   Bring small overnight bag day of surgery.    Patients discharged on the day of surgery will not be allowed to drive home.  Someone needs to stay with you for the first 24 hours after anesthesia.   Special Instructions: Bring a copy of your healthcare power of attorney and living will documents         the day of surgery if you haven't scanned them before.              Please read over the following fact sheets you were given: IF YOU HAVE QUESTIONS ABOUT YOUR PRE-OP INSTRUCTIONS PLEASE CALL [phone]     [facility] - Preparing for Surgery Before surgery, you can play an important role.  Because skin is not sterile, your skin needs to be as free of germs as possible.  You can reduce the number of germs on your skin by washing with CHG (chlorahexidine gluconate) soap before surgery.  CHG is an antiseptic cleaner which kills germs and bonds with the skin to continue killing germs even after washing. Please DO NOT use if you have an allergy to CHG or antibacterial soaps.  If your skin becomes reddened/irritated stop using the CHG and inform  your nurse when you arrive at Short Stay. Do not shave (including legs and underarms) for at least 48 hours prior to the first CHG shower.  You may shave your face/neck. Please follow these instructions carefully:  1.  Shower with CHG Soap the night before surgery and the  morning of Surgery.  2.  If you choose to wash your hair, wash your hair first as usual with your  normal  shampoo.  3.  After you shampoo, rinse your hair and body thoroughly to remove the  shampoo.                           4.  Use CHG as you would any other liquid soap.  You can apply chg directly  to the skin and wash                       Gently with a scrungie or clean washcloth.  5.  Apply the CHG Soap to  your body ONLY FROM THE NECK DOWN.   Do not use on face/ open                           Wound or open sores. Avoid contact with eyes, ears mouth and genitals (private parts).                       Wash face,  Genitals (private parts) with your normal soap.             6.  Wash thoroughly, paying special attention to the area where your surgery  will be performed.  7.  Thoroughly rinse your body with warm water from the neck down.  8.  DO NOT shower/wash with your normal soap after using and rinsing off  the CHG Soap.                9.  Pat yourself dry with a clean towel.            10.  Wear clean pajamas.            11.  Place clean sheets on your bed the night of your first shower and do not  sleep with pets. Day of Surgery : Do not apply any lotions/deodorants the morning of surgery.  Please wear clean clothes to the hospital/surgery center.  FAILURE TO FOLLOW THESE INSTRUCTIONS MAY RESULT IN THE CANCELLATION OF YOUR SURGERY PATIENT SIGNATURE_________________________________  NURSE SIGNATURE__________________________________  ________________________________________________________________________   Adam Phenix  An incentive spirometer is a tool that can help keep your lungs clear and active. This  tool measures how well you are filling your lungs with each breath. Taking long deep breaths may help reverse or decrease the chance of developing breathing (pulmonary) problems (especially infection) following: A long period of time when you are unable to move or be active. BEFORE THE PROCEDURE  If the spirometer includes an indicator to show your best effort, your nurse or respiratory therapist will set it to a desired goal. If possible, sit up straight or lean slightly forward. Try not to slouch. Hold the incentive spirometer in an upright position. INSTRUCTIONS FOR USE  Sit on the edge of your bed if possible, or sit up as far as you can in bed or on a chair. Hold the incentive spirometer in an upright position. Breathe out normally. Place the mouthpiece in your mouth and seal your lips tightly around it. Breathe in slowly and as deeply as possible, raising the piston or the ball toward the top of the column. Hold your breath for 3-5 seconds or for as long as possible. Allow the piston or ball to fall to the bottom of the column. Remove the mouthpiece from your mouth and breathe out normally. Rest for a few seconds and repeat Steps 1 through 7 at least 10 times every 1-2 hours when you are awake. Take your time and take a few normal breaths between deep breaths. The spirometer may include an indicator to show your best effort. Use the indicator as a goal to work toward during each repetition. After each set of 10 deep breaths, practice coughing to be sure your lungs are clear. If you have an incision (the cut made at the time of surgery), support your incision when coughing by placing a pillow or rolled up towels firmly against it. Once you are able to get out of bed, walk around indoors and  cough well. You may stop using the incentive spirometer when instructed by your caregiver.  RISKS AND COMPLICATIONS Take your time so you do not get dizzy or light-headed. If you are in pain, you may need  to take or ask for pain medication before doing incentive spirometry. It is harder to take a deep breath if you are having pain. AFTER USE Rest and breathe slowly and easily. It can be helpful to keep track of a log of your progress. Your caregiver can provide you with a simple table to help with this. If you are using the spirometer at home, follow these instructions: [place] IF:  You are having difficultly using the spirometer. You have trouble using the spirometer as often as instructed. Your pain medication is not giving enough relief while using the spirometer. You develop fever of 100.5 F (38.1 C) or higher. SEEK IMMEDIATE MEDICAL CARE IF:  You cough up bloody sputum that had not been present before. You develop fever of 102 F (38.9 C) or greater. You develop worsening pain at or near the incision site. MAKE SURE YOU:  Understand these instructions. Will watch your condition. Will get help right away if you are not doing well or get worse. Document Released: 03/21/2007 Document Revised: 01/31/2012 Document Reviewed: 05/22/2007 [facility] Patient Information 2014 [place], [place].   ________________________________________________________________________

## 2022-01-07 ENCOUNTER — Encounter (HOSPITAL_COMMUNITY): Payer: Self-pay

## 2022-01-07 ENCOUNTER — Encounter (HOSPITAL_COMMUNITY)
Admission: RE | Admit: 2022-01-07 | Discharge: 2022-01-07 | Disposition: A | Discharge status: Home / Self Care | Payer: Medicare Other | Source: Ambulatory Visit | Attending: General Surgery | Admitting: General Surgery

## 2022-01-07 ENCOUNTER — Other Ambulatory Visit: Payer: Self-pay

## 2022-01-07 VITALS — BP 142/89 | HR 66 | Temp 98.0°F | Ht 70.0 in | Wt 138.0 lb

## 2022-01-07 DIAGNOSIS — Z01818 Encounter for other preprocedural examination: Secondary | ICD-10-CM | POA: Diagnosis not present

## 2022-01-07 DIAGNOSIS — I25119 Atherosclerotic heart disease of native coronary artery with unspecified angina pectoris: Secondary | ICD-10-CM

## 2022-01-07 HISTORY — DX: Hypothyroidism, unspecified: E03.9

## 2022-01-07 HISTORY — DX: Depression, unspecified: F32.A

## 2022-01-07 HISTORY — DX: Malignant (primary) neoplasm, unspecified: C80.1

## 2022-01-07 HISTORY — DX: Anxiety disorder, unspecified: F41.9

## 2022-01-07 LAB — BASIC METABOLIC PANEL
Anion gap: 6 (ref 5–15)
BUN: 11 mg/dL (ref 8–23)
CO2: 27 mmol/L (ref 22–32)
Calcium: 8.8 mg/dL — ABNORMAL LOW (ref 8.9–10.3)
Chloride: 103 mmol/L (ref 98–111)
Creatinine, Ser: 0.52 mg/dL — ABNORMAL LOW (ref 0.61–1.24)
GFR, Estimated: >60 mL/min (ref >60)
Glucose, Bld: 92 mg/dL (ref 70–99)
Potassium: 4.2 mmol/L (ref 3.5–5.1)
Sodium: 136 mmol/L (ref 135–145)

## 2022-01-07 LAB — CBC
HCT: 40.1 % (ref 39.0–52.0)
Hemoglobin: 14.1 g/dL (ref 13.0–17.0)
MCH: 32.3 pg (ref 26.0–34.0)
MCHC: 35.2 g/dL (ref 30.0–36.0)
MCV: 91.8 fL (ref 80.0–100.0)
Platelets: 202 10*3/uL (ref 150–400)
RBC: 4.37 MIL/uL (ref 4.22–5.81)
RDW: 12.1 % (ref 11.5–15.5)
WBC: 5.3 10*3/uL (ref 4.0–10.5)
nRBC: 0 % (ref 0.0–0.2)

## 2022-01-07 NOTE — Progress Notes (Signed)
COVID Vaccine Completed: Yes Date COVID Vaccine completed: 2022 x 3 COVID vaccine manufacturer: [place] Test: 01/25/22 @ 8:00 AM Bowel prep reminder: DONE  PCP - Dr. London Pepper Cardiologist - Dr. Larae Grooms  Chest x-ray -  EKG -  Stress Test -  ECHO - 09/05/17 Cardiac Cath - 04/11/17 Pacemaker/ICD device last checked:  Sleep Study -  CPAP -   Fasting Blood Sugar -  Checks Blood Sugar _____ times a day  Blood Thinner Instructions: Aspirin Instructions: Last Dose:  Anesthesia review: Hx: Heart attack,CAD,Heart murmur,PVD  Patient denies shortness of breath, fever, cough and chest pain at PAT appointment   Patient verbalized understanding of instructions that were given to them at the PAT appointment. Patient was also instructed that they will need to review over the PAT instructions again at home before surgery.

## 2022-01-18 NOTE — Progress Notes (Addendum)
Cardiology Office Note:    Date:  01/19/2022   ID:  Darin Simmons, DOB [birth year], MRN [id number]  PCP:  Darin Simmons, Darin Simmons   [facility] Providers Cardiologist:  Darin Simmons, Darin Simmons     Referring Darin Simmons: Darin Simmons, Darin Simmons   Chief Complaint: preoperative cardiac evaluation for upcoming colectomy, annual follow-up CAD, AS  History of Present Illness:    Darin Simmons is a 81 y.o. male with a hx of aortic atherosclerosis, CAD s/p DES to RCA, aortic stenosis s/p AVR, hypothyroidism, mixed hyperlipidemia, and chronic HFpEF.  He had DES to RCA CTO in 2008. He had normal LV function at the time. Echo April 2018 which showed a trileaflet aortic valve with severely calcified leaflets and restricted mobility with severe stenosis. He underwent AVR June 2018 with bioprosthetic valve. 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 Post op was complicated by a sternal wound infection. He was seen in consult by EP during hospitalization for asymptomatic sinus node dysfunction with occasional daytime HRs in the 40s. Had brief NSVT on telemetry for which he was asymptomatic. LV function normal by echo. AV nodal and sinus nodal blocking agents avoided.   In September 2018, he was seen by Dr. Caryl Simmons for lightheadedness. He wore a monitor in November 2018 which revealed symptoms of lightheadedness associated with PVC and atrial ectopy, no tachycardia, no pauses. He has maintained consistent follow-up.   He was last seen in our office on 11/24/20 by Dr. Irish Simmons. He had called with concerns for shortness of breath but was feeling well at the office visit. No changes were made and he was advised to follow-up in 1 year.   Today, he is here for preoperative cardiac evaluation for upcoming colectomy for colon cancer. He reports he has an occasional ache in his right arm that radiates to left arm but it is short lived. He continues to walk briskly for 35  minutes several days per week. He will occasionally stop for a few seconds but does not have increased discomfort with exercise. Is able to carry a 50 foot ladder across his yard and cut trees with a hand saw without discomfort. He feels that some lingering shortness of breath is the result of "long COVID." Reports 2 infections over the last 12-18 months. He had diarrhea for 2 months of diarrhea following most recent COVID infection which led him to get a colonoscopy and unfortunately colon cancer was found. He has stopped taking Crestor. He has strong opinions about medications and about the [place] health care system. He takes no prescriptions. Has history of hypothyroid which he has treated with yoga, food, and herbs. Feels his throat get sore when his thyroid is malfunctioning. Does yoga for 30 + minutes each morning. Eats a plant based diet, takes lots of supplements, hydrates well. He denies lower extremity edema, fatigue, palpitations, melena, hematuria, hemoptysis, diaphoresis, weakness, presyncope, syncope, orthopnea, and PND. According to the Revised Cardiac Risk Index (RCRI), his Perioperative Risk of Major Cardiac Event is (%): 6.6 His Functional Capacity in METs is: 7.59 according to the [facility] Activity Status Index (DASI).   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[place])    Umbilical hernia 35/46/5681   Wound infection 04/2017    Past Surgical History:  Procedure Laterality Date   AORTIC  VALVE REPLACEMENT N/A 04/22/2017   Procedure: AORTIC VALVE REPLACEMENT (AVR) using Magna Ease size 65mm;  Surgeon: Darin Simmons, Darin Simmons;  Location: [place];  Service: Open Heart Surgery;  Laterality: N/A;   APPLICATION OF WOUND VAC N/A 05/13/2017   Procedure: WOUND VAC CHANGE;  Surgeon: Darin Simmons, Darin Simmons;  Location: [place];  Service: Thoracic;  Laterality: N/A;   DENTAL SURGERY     removed   hair follicle removed Left    heart stent     HERNIA REPAIR Bilateral 2/75/17   BIH and umbilical hernia repair   INGUINAL HERNIA REPAIR Bilateral 01/18/2013   Procedure: LAPAROSCOPIC BILATERAL INGUINAL HERNIA REPAIR;  Surgeon: Darin Simmons, Darin Simmons;  Location: [facility];  Service: General;  Laterality: Bilateral;   INSERTION OF MESH Bilateral 01/18/2013   Procedure: INSERTION OF MESH;  Surgeon: Darin Simmons, Darin Simmons;  Location: [facility];  Service: General;  Laterality: Bilateral;  AND UMBILICUS   STERNAL WOUND DEBRIDEMENT N/A 05/11/2017   Procedure: STERNAL WOUND IRRIGATION AND DEBRIDEMENT;  Surgeon: Darin Simmons, Darin Simmons;  Location: [place];  Service: Thoracic;  Laterality: N/A;   TEE WITHOUT CARDIOVERSION N/A 04/22/2017   Procedure: TRANSESOPHAGEAL ECHOCARDIOGRAM (TEE);  Surgeon: Darin Simmons, Darin Simmons;  Location: [place];  Service: Open Heart Surgery;  Laterality: N/A;   UMBILICAL HERNIA REPAIR N/A 01/18/2013   Procedure: HERNIA REPAIR UMBILICAL ADULT;  Surgeon: Darin Simmons, Darin Simmons;  Location: [facility];  Service: General;  Laterality: N/A;    Current Medications: Current Meds  Medication Sig   Acetylcysteine (NAC 600 PO) Take 600 mg by mouth daily.   Alpha-Lipoic Acid 200 MG TABS Take 200-400 mg by mouth daily.   arginine 500 MG tablet Take 500 mg by mouth daily.   Ascorbic Acid (VITAMIN C) 1000 MG tablet Take 1,000 mg by mouth in the morning and at bedtime.   b complex vitamins capsule Take 1 capsule by mouth daily.   Bilberry 150 MG CAPS Take 150 mg by mouth daily.   Bioflavonoid Products (GRAPE SEED PO)  Take 300 mg by mouth daily.   Cholecalciferol (VITAMIN D) 50 MCG (2000 UT) CAPS Take 2,000 Units by mouth in the morning and at bedtime.   Chromium Picolinate 200 MCG TABS Take 200 mcg by mouth daily.   Coenzyme Q10 (CO Q-10) 100 MG CAPS Take 100 mg by mouth in the morning and at bedtime.   Ginkgo Biloba 60 MG TABS Take 60 mg by mouth in the morning and at bedtime.   Hawthorn 565 MG CAPS Take 565 mg by mouth in the morning and at bedtime.   Lutein 20 MG CAPS Take 20 mg by mouth daily.   Lysine 1000 MG TABS Take 1,000 mg by mouth 2 (two) times a week.   Magnesium 250 MG TABS Take 250 mg by mouth in the morning and at bedtime.   milk thistle 175 MG tablet Take 175 mg by mouth in the morning and at bedtime.   Misc Natural Products (PROSTATE CONTROL)  CAPS Take 1 capsule by mouth daily.   OVER THE COUNTER MEDICATION Take 3 capsules by mouth daily. Rejuvenzyme - Systemic Enzyme   PINE BARK, PYCNOGENOL, PO Take 150 mg by mouth daily.   Potassium 99 MG TABS Take 99 mg by mouth daily.   QUERCETIN PO Take 475 mg by mouth daily.   Saw Palmetto 450 MG CAPS Take 450 mg by mouth daily.   Selenium 200 MCG CAPS Take 200 mcg by mouth daily.   vitamin B-12 (CYANOCOBALAMIN) 1000 MCG tablet Take 1,000 mcg by mouth daily.   Vitamin E 134 MG (200 UNIT) TABS Take 200 Units by mouth daily.   Vitamin E 268 MG (400 UNIT) TABS Take 200 Units by mouth daily.   zinc sulfate 220 (50 Zn) MG capsule Take 220 mg by mouth daily.   [DISCONTINUED] rosuvastatin (CRESTOR) 20 MG tablet Take 1 tablet (20 mg total) by mouth daily.     Allergies:   Bee venom, Crestor [rosuvastatin], Diphenhydramine hcl, Statins, Diphenhydramine hcl, and Other   Social History   Socioeconomic History   Marital status: Divorced    Spouse name: Not on file   Number of children: Not on file   Years of education: Not on file   Highest education level: Not on file  Occupational History   Not on file  Tobacco Use   Smoking status: Former     Packs/day: 1.00    Years: 50.00    Pack years: 50.00    Types: Cigarettes    Quit date: 11/22/2008    Years since quitting: 13.1   Smokeless tobacco: Never  Vaping Use   Vaping Use: Never used  Substance and Sexual Activity   Alcohol use: No   Drug use: No   Sexual activity: Not on file  Other Topics Concern   Not on file  Social History Narrative   Not on file   Social Determinants of Health   Financial Resource Strain: Not on file  Food Insecurity: Not on file  Transportation Needs: Not on file  Physical Activity: Not on file  Stress: Not on file  Social Connections: Not on file     Family History: The patient's family history includes Heart disease in his father. There is no history of Thyroid disease.  ROS:   Please see the history of present illness.   + occasional right arm ache All other systems reviewed and are negative.  Labs/Other Studies Reviewed:    The following studies were reviewed today:  Cardiac monitor 04/2019  Predominantly normal sinus rhythm. Frequent PACs. Occasional short runs of SVT. Max HR 182 for 5 beats. Longest duration 16.2 seconds at 108 bpm. No sustained pathologic arrhythmias.   If he is feeling a lot of sx from his PACs, could refer to EP.    Echo 08/2017   Left ventricle:  The cavity size was normal. Wall thickness was  increased in a pattern of mild LVH. Systolic function was normal.  The estimated ejection fraction was in the range of 60% to 65%.  Wall motion was normal; there were no regional wall motion  abnormalities. Features are consistent with a pseudonormal left  ventricular filling pattern, with concomitant abnormal relaxation  and increased filling pressure (grade 2 diastolic dysfunction).  Aortic valve:  A bioprosthesis was present.  Doppler:   There was  no stenosis.      VTI ratio of LVOT to aortic valve: 0.48. Peak  velocity ratio of LVOT to aortic valve: 0.48.  Mean velocity ratio  of LVOT to aortic valve:  0.41.    Mean gradient (S): 11 mm Hg. Peak  gradient (S): 23 mm Hg.  Aorta: Aortic root: The aortic root was normal in size.  Ascending aorta: The ascending aorta was normal in size.  Mitral valve:   Mildly calcified annulus. Leaflet separation was  normal.  Doppler:  Transvalvular velocity was within the normal  range. There was no evidence for stenosis. There was no  regurgitation.    Peak gradient (D): 3 mm Hg.  Left atrium:  The atrium was normal in size.  Right ventricle:  The cavity size was normal. Systolic function was  normal.  Pulmonic valve:    The valve appears to be grossly normal.  Doppler:  There was no significant regurgitation.  Tricuspid valve:   The valve appears to be grossly normal.  Doppler:  There was trivial regurgitation.  Right atrium:  The atrium was mildly dilated.  Pericardium: There was no pericardial effusion.   [place] 03/2017  Patent RCA stents. Mild to moderate left sided disease. Aortic valve was not crossed. Normal right heart pressures. PA sat 76%. CO 5.3 L/min; CI 2.9.   Continue with plans for aortic valve repair.  Recent Labs: 02/17/2021: TSH 3.38 01/07/2022: BUN 11; Creatinine, Ser 0.52; Hemoglobin 14.1; Platelets 202; Potassium 4.2; Sodium 136  Recent Lipid Panel    Component Value Date/Time   CHOL 183 11/24/2020 0845   TRIG 150 (H) 11/24/2020 0845   HDL 44 11/24/2020 0845   CHOLHDL 4.2 11/24/2020 0845   CHOLHDL 2.4 08/27/2015 0901   VLDL 13 08/27/2015 0901   LDLCALC 112 (H) 11/24/2020 0845     Risk Assessment/Calculations:      Physical Exam:    VS:  BP 124/80 (BP Location: Left Arm, Patient Position: Sitting, Cuff Size: Normal)    Pulse 61    Ht 5\' 10"  (1.778 m)    Wt 137 lb 9.6 oz (62.4 kg)    BMI 19.74 kg/m     Wt Readings from Last 3 Encounters:  01/19/22 137 lb 9.6 oz (62.4 kg)  01/07/22 138 lb (62.6 kg)  02/26/21 153 lb (69.4 kg)     GEN:  Well nourished, well developed in no acute distress HEENT: Normal NECK: No  JVD; No carotid bruits CARDIAC: RRR, 3/6 systolic murmur auscultated throughout chest. No rubs, gallops RESPIRATORY:  Clear to auscultation without rales, wheezing or rhonchi  ABDOMEN: Soft, non-tender, non-distended MUSCULOSKELETAL:  No edema; No deformity. 2+ pedal pulses, equal bilaterally SKIN: Warm and dry NEUROLOGIC:  Alert and oriented x 3 PSYCHIATRIC:  Normal affect   EKG:  EKG is ordered today.  The ekg ordered today demonstrates sinus rhythm, LVH pattern, TWI leads V3-6, no acute change from previous  Diagnoses:    1. Coronary artery disease involving native coronary artery of native heart without angina pectoris   2. Hyperlipidemia LDL goal <70   3. S/P AVR   4. Aortic valve stenosis, etiology of cardiac valve disease unspecified   5. Essential hypertension   6. LVH (left ventricular hypertrophy)   7. Chronic heart failure with preserved ejection fraction (HFpEF) ([place])   8. Preop cardiovascular exam    Assessment and Plan:     Preoperative cardiac evaluation: He can easily achieve greater than 4 METS activity, however he does report occasional symptoms of right arm ache that radiates across his shoulders. Symptoms do not worsen with activity. He is very active at home with exercise  and heavy yard work. As noted below, he does not feel that this is angina equivalent.  I stressed the importance of making certain his heart is healthy for surgery but he declines further cardiac testing. RCRI score for MACE is 6.6%. Will forward to primary cardiologist for agreement with cardiac clearance. Addendum: Clearance received from Dr. Irish Simmons, will forward to surgeon.  CAD without angina: Has occasional right arm pain that radiates across his shoulders. He does not feel that this is his anginal equivalent. He is not on GDMT per his preference. He does not believe in the benefit of aspirin, statin. Continue heart healthy diet and regular exercise.   Aortic stenosis s/p AVR: Echo 2018  revealed normal valve function. No indication of worsening function.   Chronic HFpEF:  LVEF 60-65%, G2 DD by echo 08/2017.  He denies dyspnea, orthopnea, PND, edema.  Appears euvolemic on exam today.  Hyperlipidemia LDL goal < 70: LDL 112 11/2020. Has stopped rosuvastatin because he does not believe it is beneficial.   Essential hypertension: Blood pressure well controlled today.  He reports similar readings at home.  He is not on any antihypertensives.  Continue low-sodium diet and regular physical activity  Darin Booze, Darin Simmons  Darin Simmons, Darin Schwab, NP OK to clear for colectomy.  No stress test needed.         Disposition: 6 months with Dr. Irish Simmons  Medication Adjustments/Labs and Tests Ordered: Current medicines are reviewed at length with the patient today.  Concerns regarding medicines are outlined above.  Orders Placed This Encounter  Procedures   EKG 12-Lead   No orders of the defined types were placed in this encounter.   Patient Instructions  Medication Instructions:  Your physician recommends that you continue on your current medications as directed. Please refer to the Current Medication list given to you today.  *If you need a refill on your cardiac medications before your next appointment, please call your pharmacy*   Lab Work: None ordered  If you have labs (blood work) drawn today and your tests are completely normal, you will receive your results only by: [place] (if you have MyChart) OR A paper copy in the mail If you have any lab test that is abnormal or we need to change your treatment, we will call you to review the results.   Testing/Procedures: None ordered   Follow-Up: At [facility], you and your health needs are our priority.  As part of our continuing mission to provide you with exceptional heart care, we have created designated Provider Care Teams.  These Care Teams include your primary Cardiologist (physician) and Advanced  Practice Providers (APPs -  Physician Assistants and Nurse Practitioners) who all work together to provide you with the care you need, when you need it.  We recommend signing up for the patient portal called "MyChart".  Sign up information is provided on this After Visit Summary.  MyChart is used to connect with patients for Virtual Visits (Telemedicine).  Patients are able to view lab/test results, encounter notes, upcoming appointments, etc.  Non-urgent messages can be sent to your provider as well.   To learn more about what you can do with MyChart, go to [email or web address].    Your next appointment:   6 month(s)  The format for your next appointment:   In Person  Provider:   Larae Simmons, Darin Simmons    Other Instructions    Signed, Emmaline Life, NP  01/19/2022 9:40 AM    [facility]

## 2022-01-19 ENCOUNTER — Encounter: Payer: Self-pay | Admitting: Nurse Practitioner

## 2022-01-19 ENCOUNTER — Other Ambulatory Visit: Payer: Self-pay

## 2022-01-19 ENCOUNTER — Ambulatory Visit (INDEPENDENT_AMBULATORY_CARE_PROVIDER_SITE_OTHER): Payer: Medicare Other | Admitting: Nurse Practitioner

## 2022-01-19 VITALS — BP 124/80 | HR 61 | Ht 70.0 in | Wt 137.6 lb

## 2022-01-19 DIAGNOSIS — Z952 Presence of prosthetic heart valve: Secondary | ICD-10-CM | POA: Diagnosis not present

## 2022-01-19 DIAGNOSIS — E785 Hyperlipidemia, unspecified: Secondary | ICD-10-CM | POA: Diagnosis not present

## 2022-01-19 DIAGNOSIS — I5032 Chronic diastolic (congestive) heart failure: Secondary | ICD-10-CM | POA: Diagnosis not present

## 2022-01-19 DIAGNOSIS — I517 Cardiomegaly: Secondary | ICD-10-CM | POA: Diagnosis not present

## 2022-01-19 DIAGNOSIS — Z01810 Encounter for preprocedural cardiovascular examination: Secondary | ICD-10-CM | POA: Diagnosis not present

## 2022-01-19 DIAGNOSIS — I10 Essential (primary) hypertension: Secondary | ICD-10-CM | POA: Diagnosis not present

## 2022-01-19 DIAGNOSIS — I350 Nonrheumatic aortic (valve) stenosis: Secondary | ICD-10-CM | POA: Diagnosis not present

## 2022-01-19 DIAGNOSIS — I2510 Atherosclerotic heart disease of native coronary artery without angina pectoris: Secondary | ICD-10-CM

## 2022-01-19 DIAGNOSIS — I25119 Atherosclerotic heart disease of native coronary artery with unspecified angina pectoris: Secondary | ICD-10-CM

## 2022-01-19 NOTE — Patient Instructions (Signed)
Medication Instructions:  Your physician recommends that you continue on your current medications as directed. Please refer to the Current Medication list given to you today.  *If you need a refill on your cardiac medications before your next appointment, please call your pharmacy*   Lab Work: None ordered  If you have labs (blood work) drawn today and your tests are completely normal, you will receive your results only by: [place] (if you have MyChart) OR A paper copy in the mail If you have any lab test that is abnormal or we need to change your treatment, we will call you to review the results.   Testing/Procedures: None ordered   Follow-Up: At [facility], you and your health needs are our priority.  As part of our continuing mission to provide you with exceptional heart care, we have created designated Provider Care Teams.  These Care Teams include your primary Cardiologist (physician) and Advanced Practice Providers (APPs -  Physician Assistants and Nurse Practitioners) who all work together to provide you with the care you need, when you need it.  We recommend signing up for the patient portal called "MyChart".  Sign up information is provided on this After Visit Summary.  MyChart is used to connect with patients for Virtual Visits (Telemedicine).  Patients are able to view lab/test results, encounter notes, upcoming appointments, etc.  Non-urgent messages can be sent to your provider as well.   To learn more about what you can do with MyChart, go to [email or web address].    Your next appointment:   6 month(s)  The format for your next appointment:   In Person  Provider:   Larae Grooms, MD    Other Instructions

## 2022-01-20 NOTE — Anesthesia Preprocedure Evaluation (Addendum)
Anesthesia Evaluation  ?Patient identified by MRN, date of birth, ID band ?Patient awake ? ? ? ?Reviewed: ?Allergy & Precautions, NPO status , Patient's Chart, lab work & pertinent test results ? ?History of Anesthesia Complications ?Negative for: history of anesthetic complications ? ?Airway ?Mallampati: I ? ?TM Distance: >3 FB ?Neck ROM: Full ? ? ? Dental ? ?(+) Edentulous Upper, Poor Dentition, Chipped, Missing, Dental Advisory Given ?  ?Pulmonary ?former smoker,  ?01/25/2022 SARS coronavirus NEG ?  ?breath sounds clear to auscultation ? ? ? ? ? ? Cardiovascular ?(-) angina+ CAD, + Cardiac Stents and + Peripheral Vascular Disease  ?+ Valvular Problems/Murmurs (s/p AVR)  ?Rhythm:Regular Rate:Normal ? ?'18 ECHO: - Left ventricle: The cavity size was normal, mild LVH. Systolic function was normal. EF 60% to 65%.  ? no regional wall motion abnormalities. grade 2 DD ?- Aortic valve: A bioprosthesis was present.  ?- Mitral valve: Mildly calcified annulus.  ?- Right atrium: The atrium was mildly dilated.  ?  ?Neuro/Psych ?Anxiety Depression negative neurological ROS ?   ? GI/Hepatic ?negative GI ROS, Neg liver ROS,   ?Endo/Other  ?Hypothyroidism  ? Renal/GU ?negative Renal ROS  ? ?  ?Musculoskeletal ? ?(+) Arthritis ,  ? Abdominal ?  ?Peds ? Hematology ?negative hematology ROS ?(+)   ?Anesthesia Other Findings ? ? Reproductive/Obstetrics ? ?  ? ? ? ? ? ? ? ? ? ? ? ? ? ?  ?  ? ? ? ? ? ? ?Anesthesia Physical ?Anesthesia Plan ? ?ASA: 3 ? ?Anesthesia Plan: General  ? ?Post-op Pain Management: Tylenol PO (pre-op)* and Dilaudid IV  ? ?Induction:  ? ?PONV Risk Score and Plan: 2 and Ondansetron and Dexamethasone ? ?Airway Management Planned: Oral ETT ? ?Additional Equipment: None ? ?Intra-op Plan:  ? ?Post-operative Plan: Extubation in OR ? ?Informed Consent: I have reviewed the patients History and Physical, chart, labs and discussed the procedure including the risks, benefits and alternatives  for the proposed anesthesia with the patient or authorized representative who has indicated his/her understanding and acceptance.  ? ? ? ?Dental advisory given ? ?Plan Discussed with: CRNA and Surgeon ? ?Anesthesia Plan Comments: (See PAT note 01/07/2022)  ? ? ? ? ?Anesthesia Quick Evaluation ? ?

## 2022-01-20 NOTE — Progress Notes (Signed)
Anesthesia Chart Review   Case: [id number] Date/Time: 01/27/22 1245   Procedure: XI ROBOT ASSISTED LAPAROSCOPIC PARTIAL COLECTOMY   Anesthesia type: General   Pre-op diagnosis: COLON CANCER   Location: [facility] ROOM 02 / [facility]   Surgeons: Leighton Ruff, MD       [id number] y.o. former smoker with h/o AV replacement 04/2017, CAD (DES 2008), chronic HFpEF, PVD, colon cancer scheduled for above procedure 03/28/4331 with Dr. Leighton Ruff.   Pt last seen by cardiology 01/19/2022. Per OV note, "Preoperative cardiac evaluation: He can easily achieve greater than 4 METS activity, however he does report occasional symptoms of right arm ache that radiates across his shoulders. Symptoms do not worsen with activity. He is very active at home with exercise and heavy yard work. As noted below, he does not feel that this is angina equivalent.  I stressed the importance of making certain his heart is healthy for surgery but he declines further cardiac testing. RCRI score for MACE is 6.6%. Will forward to primary cardiologist for agreement with cardiac clearance. Addendum: Clearance received from Dr. Irish Lack, will forward to surgeon."  Per Dr. Irish Lack, "OK to clear for colectomy. No stress test needed."  Anticipate pt can proceed with planned procedure barring acute status change.   VS: BP (!) 142/89    Pulse 66    Temp 36.7 C (Oral)    Ht 5\' 10"  (1.778 m)    Wt 62.6 kg    SpO2 98%    BMI 19.80 kg/m   PROVIDERS: London Pepper, MD is PCP   Cardiologist:  Larae Grooms, MD    LABS: Labs reviewed: Acceptable for surgery. (all labs ordered are listed, but only abnormal results are displayed)  Labs Reviewed  BASIC METABOLIC PANEL - Abnormal; Notable for the following components:      Result Value   Creatinine, Ser 0.52 (*)    Calcium 8.8 (*)    All other components within normal limits  CBC     IMAGES:   EKG: 01/19/2022 Rate 61 bpm  Sinus rhythm  LVH pattern   CV:  Echo 04/22/2017 - Left  ventricle: The cavity size was normal. Wall thickness was    increased in a pattern of mild LVH. Systolic function was normal.    The estimated ejection fraction was in the range of 60% to 65%.    Wall motion was normal; there were no regional wall motion    abnormalities. Features are consistent with a pseudonormal left    ventricular filling pattern, with concomitant abnormal relaxation    and increased filling pressure (grade 2 diastolic dysfunction).  - Aortic valve: A bioprosthesis was present.  - Mitral valve: Mildly calcified annulus.  - Right atrium: The atrium was mildly dilated. Past Medical History:  Diagnosis Date   Amputation, thumb, traumatic    tip of thumb gone from saw   Anxiety    Aortic stenosis    Arthritis    Bilateral inguinal hernia 03/28/2012   BPH (benign prostatic hyperplasia)    CAD (coronary artery disease) 2004   Cancer 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[place])    Umbilical hernia 95/18/8416   Wound infection 04/2017    Past Surgical  History:  Procedure Laterality Date   AORTIC VALVE REPLACEMENT N/A 04/22/2017   Procedure: AORTIC VALVE REPLACEMENT (AVR) using Magna Ease size 46mm;  Surgeon: Gaye Pollack, MD;  Location: [place] [facility];  Service: Open Heart Surgery;  Laterality: N/A;   APPLICATION OF WOUND VAC N/A 05/13/2017   Procedure: WOUND VAC CHANGE;  Surgeon: Melrose Nakayama, MD;  Location: [place];  Service: Thoracic;  Laterality: N/A;   DENTAL SURGERY     removed   hair follicle removed Left    heart stent     HERNIA REPAIR Bilateral 5/62/13   BIH and umbilical hernia repair   INGUINAL HERNIA REPAIR Bilateral 01/18/2013   Procedure: LAPAROSCOPIC BILATERAL INGUINAL HERNIA REPAIR;  Surgeon: Odis Hollingshead, MD;   [facility];  Service: General;  Laterality: Bilateral;   INSERTION OF MESH Bilateral 01/18/2013   Procedure: INSERTION OF MESH;  Surgeon: Odis Hollingshead, MD;  [facility];  Service: General;  Laterality: Bilateral;  AND UMBILICUS   STERNAL WOUND DEBRIDEMENT N/A 05/11/2017   Procedure: STERNAL WOUND IRRIGATION AND DEBRIDEMENT;  Surgeon: Melrose Nakayama, MD;  Location: [place];  Service: Thoracic;  Laterality: N/A;   TEE WITHOUT CARDIOVERSION N/A 04/22/2017   Procedure: TRANSESOPHAGEAL ECHOCARDIOGRAM (TEE);  Surgeon: Gaye Pollack, MD;  Location: [place];  Service: Open Heart Surgery;  Laterality: N/A;   UMBILICAL HERNIA REPAIR N/A 01/18/2013   Procedure: HERNIA REPAIR UMBILICAL ADULT;  Surgeon: Odis Hollingshead, MD;  [facility];  Service: General;  Laterality: N/A;    MEDICATIONS:  Acetylcysteine (NAC 600 PO)   Alpha-Lipoic Acid 200 MG TABS   arginine 500 MG tablet   Ascorbic Acid (VITAMIN C) 1000 MG tablet   b complex vitamins capsule   Bilberry 150 MG CAPS   Bioflavonoid Products (GRAPE SEED PO)   Cholecalciferol (VITAMIN D) 50 MCG (2000 UT) CAPS   Chromium Picolinate 200 MCG TABS   Coenzyme Q10 (CO Q-10) 100 MG CAPS   Ginkgo Biloba 60 MG TABS   [place]orn 565 MG CAPS   Lutein 20 MG CAPS   Lysine 1000 MG TABS   Magnesium 250 MG TABS   milk thistle 175 MG tablet   Misc Natural Products (PROSTATE CONTROL) CAPS   OVER THE COUNTER MEDICATION   PINE BARK, PYCNOGENOL, PO   Potassium 99 MG TABS   QUERCETIN PO   Saw Palmetto 450 MG CAPS   Selenium 200 MCG CAPS   vitamin B-12 (CYANOCOBALAMIN) 1000 MCG tablet   Vitamin E 134 MG (200 UNIT) TABS   Vitamin E 268 MG (400 UNIT) TABS   zinc sulfate 220 (50 Zn) MG capsule   No current facility-administered medications for this encounter.   Konrad Felix Ward, PA-C [facility] [phone]

## 2022-01-25 ENCOUNTER — Encounter (HOSPITAL_COMMUNITY)
Admission: RE | Admit: 2022-01-25 | Discharge: 2022-01-25 | Disposition: A | Discharge status: Home / Self Care | Payer: Medicare Other | Source: Ambulatory Visit | Attending: General Surgery | Admitting: General Surgery

## 2022-01-25 ENCOUNTER — Other Ambulatory Visit: Payer: Self-pay

## 2022-01-25 DIAGNOSIS — K388 Other specified diseases of appendix: Secondary | ICD-10-CM | POA: Diagnosis not present

## 2022-01-25 DIAGNOSIS — Z20822 Contact with and (suspected) exposure to covid-19: Secondary | ICD-10-CM | POA: Insufficient documentation

## 2022-01-25 DIAGNOSIS — I509 Heart failure, unspecified: Secondary | ICD-10-CM | POA: Diagnosis present

## 2022-01-25 DIAGNOSIS — I2510 Atherosclerotic heart disease of native coronary artery without angina pectoris: Secondary | ICD-10-CM | POA: Diagnosis not present

## 2022-01-25 DIAGNOSIS — D123 Benign neoplasm of transverse colon: Secondary | ICD-10-CM | POA: Diagnosis present

## 2022-01-25 DIAGNOSIS — C189 Malignant neoplasm of colon, unspecified: Secondary | ICD-10-CM | POA: Diagnosis not present

## 2022-01-25 DIAGNOSIS — E782 Mixed hyperlipidemia: Secondary | ICD-10-CM | POA: Diagnosis present

## 2022-01-25 DIAGNOSIS — Z91030 Bee allergy status: Secondary | ICD-10-CM | POA: Diagnosis not present

## 2022-01-25 DIAGNOSIS — Z01812 Encounter for preprocedural laboratory examination: Secondary | ICD-10-CM | POA: Insufficient documentation

## 2022-01-25 DIAGNOSIS — Z8249 Family history of ischemic heart disease and other diseases of the circulatory system: Secondary | ICD-10-CM | POA: Diagnosis not present

## 2022-01-25 DIAGNOSIS — Z888 Allergy status to other drugs, medicaments and biological substances status: Secondary | ICD-10-CM | POA: Diagnosis not present

## 2022-01-25 DIAGNOSIS — E039 Hypothyroidism, unspecified: Secondary | ICD-10-CM | POA: Diagnosis not present

## 2022-01-25 DIAGNOSIS — F418 Other specified anxiety disorders: Secondary | ICD-10-CM | POA: Diagnosis not present

## 2022-01-25 DIAGNOSIS — C180 Malignant neoplasm of cecum: Secondary | ICD-10-CM | POA: Diagnosis not present

## 2022-01-25 DIAGNOSIS — C182 Malignant neoplasm of ascending colon: Secondary | ICD-10-CM | POA: Diagnosis not present

## 2022-01-25 DIAGNOSIS — D120 Benign neoplasm of cecum: Secondary | ICD-10-CM | POA: Diagnosis not present

## 2022-01-25 DIAGNOSIS — Z952 Presence of prosthetic heart valve: Secondary | ICD-10-CM | POA: Diagnosis not present

## 2022-01-25 DIAGNOSIS — K08109 Complete loss of teeth, unspecified cause, unspecified class: Secondary | ICD-10-CM | POA: Diagnosis present

## 2022-01-25 DIAGNOSIS — Z87891 Personal history of nicotine dependence: Secondary | ICD-10-CM | POA: Diagnosis not present

## 2022-01-25 LAB — SARS CORONAVIRUS 2 (TAT 6-24 HRS): SARS Coronavirus 2: NEGATIVE

## 2022-01-27 ENCOUNTER — Encounter (HOSPITAL_COMMUNITY)
Admission: RE | Disposition: A | Discharge status: Home / Self Care | Payer: Self-pay | Source: Ambulatory Visit | Attending: General Surgery

## 2022-01-27 ENCOUNTER — Other Ambulatory Visit: Payer: Self-pay

## 2022-01-27 ENCOUNTER — Inpatient Hospital Stay (HOSPITAL_COMMUNITY)
Admission: RE | Admit: 2022-01-27 | Discharge: 2022-01-30 | DRG: 330 | Disposition: A | Discharge status: Home / Self Care | Payer: Medicare Other | Attending: General Surgery | Admitting: General Surgery

## 2022-01-27 ENCOUNTER — Encounter (HOSPITAL_COMMUNITY): Payer: Self-pay | Admitting: General Surgery

## 2022-01-27 ENCOUNTER — Inpatient Hospital Stay (HOSPITAL_COMMUNITY): Payer: Medicare Other | Admitting: Certified Registered Nurse Anesthetist

## 2022-01-27 ENCOUNTER — Inpatient Hospital Stay (HOSPITAL_COMMUNITY): Payer: Medicare Other | Admitting: Physician Assistant

## 2022-01-27 DIAGNOSIS — F418 Other specified anxiety disorders: Secondary | ICD-10-CM

## 2022-01-27 DIAGNOSIS — Z952 Presence of prosthetic heart valve: Secondary | ICD-10-CM | POA: Diagnosis not present

## 2022-01-27 DIAGNOSIS — C189 Malignant neoplasm of colon, unspecified: Secondary | ICD-10-CM | POA: Diagnosis present

## 2022-01-27 DIAGNOSIS — E782 Mixed hyperlipidemia: Secondary | ICD-10-CM | POA: Diagnosis present

## 2022-01-27 DIAGNOSIS — Z8249 Family history of ischemic heart disease and other diseases of the circulatory system: Secondary | ICD-10-CM

## 2022-01-27 DIAGNOSIS — K388 Other specified diseases of appendix: Secondary | ICD-10-CM | POA: Diagnosis not present

## 2022-01-27 DIAGNOSIS — Z888 Allergy status to other drugs, medicaments and biological substances status: Secondary | ICD-10-CM

## 2022-01-27 DIAGNOSIS — E039 Hypothyroidism, unspecified: Secondary | ICD-10-CM

## 2022-01-27 DIAGNOSIS — Z20822 Contact with and (suspected) exposure to covid-19: Secondary | ICD-10-CM | POA: Diagnosis present

## 2022-01-27 DIAGNOSIS — Z91030 Bee allergy status: Secondary | ICD-10-CM | POA: Diagnosis not present

## 2022-01-27 DIAGNOSIS — Z01818 Encounter for other preprocedural examination: Secondary | ICD-10-CM

## 2022-01-27 DIAGNOSIS — D120 Benign neoplasm of cecum: Secondary | ICD-10-CM | POA: Diagnosis not present

## 2022-01-27 DIAGNOSIS — C182 Malignant neoplasm of ascending colon: Secondary | ICD-10-CM | POA: Diagnosis present

## 2022-01-27 DIAGNOSIS — Z01812 Encounter for preprocedural laboratory examination: Principal | ICD-10-CM

## 2022-01-27 DIAGNOSIS — Z87891 Personal history of nicotine dependence: Secondary | ICD-10-CM

## 2022-01-27 DIAGNOSIS — C180 Malignant neoplasm of cecum: Principal | ICD-10-CM | POA: Diagnosis present

## 2022-01-27 DIAGNOSIS — I2510 Atherosclerotic heart disease of native coronary artery without angina pectoris: Secondary | ICD-10-CM

## 2022-01-27 DIAGNOSIS — K08109 Complete loss of teeth, unspecified cause, unspecified class: Secondary | ICD-10-CM | POA: Diagnosis present

## 2022-01-27 DIAGNOSIS — D123 Benign neoplasm of transverse colon: Secondary | ICD-10-CM | POA: Diagnosis present

## 2022-01-27 DIAGNOSIS — I509 Heart failure, unspecified: Secondary | ICD-10-CM | POA: Diagnosis present

## 2022-01-27 LAB — TYPE AND SCREEN
ABO/RH(D): A POS
Antibody Screen: NEGATIVE

## 2022-01-27 SURGERY — COLECTOMY, PARTIAL, ROBOT-ASSISTED, LAPAROSCOPIC
Anesthesia: General

## 2022-01-27 MED ORDER — SODIUM CHLORIDE 0.9 % IV SOLN
2.0000 g | INTRAVENOUS | Status: AC
  Administered 2022-01-27: 2 g via INTRAVENOUS
  Filled 2022-01-27: qty 2

## 2022-01-27 MED ORDER — LIDOCAINE 2% (20 MG/ML) 5 ML SYRINGE
INTRAMUSCULAR | Status: DC | PRN
  Administered 2022-01-27: 80 mg via INTRAVENOUS
  Administered 2022-01-27: 1.5 mg/kg/h via INTRAVENOUS

## 2022-01-27 MED ORDER — SIMETHICONE 80 MG PO CHEW
40.0000 mg | CHEWABLE_TABLET | Freq: Four times a day (QID) | ORAL | Status: DC | PRN
  Administered 2022-01-28 – 2022-01-30 (×2): 40 mg via ORAL
  Filled 2022-01-27 (×2): qty 1

## 2022-01-27 MED ORDER — MIDAZOLAM HCL 2 MG/2ML IJ SOLN: 0.5000 mg | Freq: Once | INTRAMUSCULAR | Status: DC | PRN

## 2022-01-27 MED ORDER — ONDANSETRON HCL 4 MG/2ML IJ SOLN: 4.0000 mg | Freq: Four times a day (QID) | INTRAMUSCULAR | Status: DC | PRN

## 2022-01-27 MED ORDER — LACTATED RINGERS IV SOLN: INTRAVENOUS | Status: DC | PRN

## 2022-01-27 MED ORDER — SUGAMMADEX SODIUM 200 MG/2ML IV SOLN
INTRAVENOUS | Status: DC | PRN
  Administered 2022-01-27: 200 mg via INTRAVENOUS

## 2022-01-27 MED ORDER — ROCURONIUM BROMIDE 10 MG/ML (PF) SYRINGE
PREFILLED_SYRINGE | INTRAVENOUS | Status: AC
  Filled 2022-01-27: qty 10

## 2022-01-27 MED ORDER — RINGERS IRRIGATION IR SOLN
Status: DC | PRN
  Administered 2022-01-27: 1000 mL

## 2022-01-27 MED ORDER — DEXAMETHASONE SODIUM PHOSPHATE 10 MG/ML IJ SOLN
INTRAMUSCULAR | Status: AC
  Filled 2022-01-27: qty 1

## 2022-01-27 MED ORDER — CHLORHEXIDINE GLUCONATE 0.12 % MT SOLN
15.0000 mL | Freq: Once | OROMUCOSAL | Status: AC
  Administered 2022-01-27: 15 mL via OROMUCOSAL

## 2022-01-27 MED ORDER — PROPOFOL 10 MG/ML IV BOLUS
INTRAVENOUS | Status: DC | PRN
  Administered 2022-01-27: 120 mg via INTRAVENOUS

## 2022-01-27 MED ORDER — ALVIMOPAN 12 MG PO CAPS
12.0000 mg | ORAL_CAPSULE | ORAL | Status: AC
  Administered 2022-01-27: 12 mg via ORAL
  Filled 2022-01-27: qty 1

## 2022-01-27 MED ORDER — BUPIVACAINE-EPINEPHRINE (PF) 0.25% -1:200000 IJ SOLN
INTRAMUSCULAR | Status: AC
  Filled 2022-01-27: qty 30

## 2022-01-27 MED ORDER — PHENYLEPHRINE HCL (PRESSORS) 10 MG/ML IV SOLN
INTRAVENOUS | Status: AC
  Filled 2022-01-27: qty 1

## 2022-01-27 MED ORDER — HEPARIN SODIUM (PORCINE) 5000 UNIT/ML IJ SOLN
5000.0000 [IU] | Freq: Once | INTRAMUSCULAR | Status: AC
  Administered 2022-01-27: 5000 [IU] via SUBCUTANEOUS
  Filled 2022-01-27: qty 1

## 2022-01-27 MED ORDER — ROCURONIUM BROMIDE 10 MG/ML (PF) SYRINGE
PREFILLED_SYRINGE | INTRAVENOUS | Status: DC | PRN
Start: 2022-01-27 — End: 2022-01-27
  Administered 2022-01-27: 70 mg via INTRAVENOUS
  Administered 2022-01-27: 20 mg via INTRAVENOUS
  Administered 2022-01-27: 30 mg via INTRAVENOUS

## 2022-01-27 MED ORDER — ENSURE PRE-SURGERY PO LIQD
296.0000 mL | Freq: Once | ORAL | Status: DC
  Filled 2022-01-27: qty 296

## 2022-01-27 MED ORDER — SACCHAROMYCES BOULARDII 250 MG PO CAPS
250.0000 mg | ORAL_CAPSULE | Freq: Two times a day (BID) | ORAL | Status: DC
  Administered 2022-01-27 – 2022-01-30 (×6): 250 mg via ORAL
  Filled 2022-01-27 (×6): qty 1

## 2022-01-27 MED ORDER — ONDANSETRON HCL 4 MG PO TABS: 4.0000 mg | ORAL_TABLET | Freq: Four times a day (QID) | ORAL | Status: DC | PRN

## 2022-01-27 MED ORDER — OXYCODONE HCL 5 MG PO TABS: 5.0000 mg | ORAL_TABLET | Freq: Once | ORAL | Status: DC | PRN

## 2022-01-27 MED ORDER — METOPROLOL TARTRATE 5 MG/5ML IV SOLN: 5.0000 mg | Freq: Four times a day (QID) | INTRAVENOUS | Status: DC | PRN

## 2022-01-27 MED ORDER — PHENYLEPHRINE HCL-NACL 20-0.9 MG/250ML-% IV SOLN
INTRAVENOUS | Status: DC | PRN
  Administered 2022-01-27: 50 ug/min via INTRAVENOUS

## 2022-01-27 MED ORDER — BUPIVACAINE LIPOSOME 1.3 % IJ SUSP: 20.0000 mL | Freq: Once | INTRAMUSCULAR | Status: DC

## 2022-01-27 MED ORDER — ORAL CARE MOUTH RINSE: 15.0000 mL | Freq: Once | OROMUCOSAL | Status: AC

## 2022-01-27 MED ORDER — ONDANSETRON HCL 4 MG/2ML IJ SOLN
INTRAMUSCULAR | Status: AC
  Filled 2022-01-27: qty 2

## 2022-01-27 MED ORDER — OXYCODONE HCL 5 MG/5ML PO SOLN: 5.0000 mg | Freq: Once | ORAL | Status: DC | PRN

## 2022-01-27 MED ORDER — LACTATED RINGERS IV SOLN: INTRAVENOUS | Status: DC

## 2022-01-27 MED ORDER — DEXAMETHASONE SODIUM PHOSPHATE 10 MG/ML IJ SOLN
INTRAMUSCULAR | Status: DC | PRN
  Administered 2022-01-27: 10 mg via INTRAVENOUS

## 2022-01-27 MED ORDER — FENTANYL CITRATE (PF) 100 MCG/2ML IJ SOLN
INTRAMUSCULAR | Status: AC
  Filled 2022-01-27: qty 2

## 2022-01-27 MED ORDER — HYDROMORPHONE HCL 1 MG/ML IJ SOLN: 0.5000 mg | INTRAMUSCULAR | Status: DC | PRN

## 2022-01-27 MED ORDER — SODIUM CHLORIDE 0.9 % IV SOLN
2.0000 g | Freq: Two times a day (BID) | INTRAVENOUS | Status: AC
  Administered 2022-01-27: 2 g via INTRAVENOUS
  Filled 2022-01-27: qty 2

## 2022-01-27 MED ORDER — BUPIVACAINE LIPOSOME 1.3 % IJ SUSP
INTRAMUSCULAR | Status: DC | PRN
  Administered 2022-01-27: 20 mL

## 2022-01-27 MED ORDER — ACETAMINOPHEN 500 MG PO TABS: 1000.0000 mg | ORAL_TABLET | Freq: Once | ORAL | Status: DC

## 2022-01-27 MED ORDER — KCL IN DEXTROSE-NACL 20-5-0.45 MEQ/L-%-% IV SOLN
INTRAVENOUS | Status: DC
  Filled 2022-01-27: qty 1000

## 2022-01-27 MED ORDER — ONDANSETRON HCL 4 MG/2ML IJ SOLN
INTRAMUSCULAR | Status: DC | PRN
  Administered 2022-01-27: 4 mg via INTRAVENOUS

## 2022-01-27 MED ORDER — ENSURE SURGERY PO LIQD
237.0000 mL | Freq: Two times a day (BID) | ORAL | Status: DC
  Administered 2022-01-28 – 2022-01-29 (×2): 237 mL via ORAL

## 2022-01-27 MED ORDER — BUPIVACAINE LIPOSOME 1.3 % IJ SUSP
INTRAMUSCULAR | Status: AC
  Filled 2022-01-27: qty 20

## 2022-01-27 MED ORDER — ACETAMINOPHEN 500 MG PO TABS
1000.0000 mg | ORAL_TABLET | Freq: Four times a day (QID) | ORAL | Status: DC
  Administered 2022-01-27 – 2022-01-30 (×9): 1000 mg via ORAL
  Filled 2022-01-27 (×11): qty 2

## 2022-01-27 MED ORDER — POLYETHYLENE GLYCOL 3350 17 GM/SCOOP PO POWD: 1.0000 | Freq: Once | ORAL | Status: DC

## 2022-01-27 MED ORDER — EPHEDRINE 5 MG/ML INJ
INTRAVENOUS | Status: AC
  Filled 2022-01-27: qty 5

## 2022-01-27 MED ORDER — ENSURE PRE-SURGERY PO LIQD
592.0000 mL | Freq: Once | ORAL | Status: DC
  Filled 2022-01-27: qty 592

## 2022-01-27 MED ORDER — ENOXAPARIN SODIUM 40 MG/0.4ML IJ SOSY
40.0000 mg | PREFILLED_SYRINGE | INTRAMUSCULAR | Status: DC
  Administered 2022-01-28 – 2022-01-30 (×3): 40 mg via SUBCUTANEOUS
  Filled 2022-01-27 (×3): qty 0.4

## 2022-01-27 MED ORDER — EPHEDRINE SULFATE-NACL 50-0.9 MG/10ML-% IV SOSY
PREFILLED_SYRINGE | INTRAVENOUS | Status: DC | PRN
  Administered 2022-01-27 (×3): 10 mg via INTRAVENOUS

## 2022-01-27 MED ORDER — FENTANYL CITRATE (PF) 100 MCG/2ML IJ SOLN
INTRAMUSCULAR | Status: DC | PRN
  Administered 2022-01-27 (×2): 50 ug via INTRAVENOUS

## 2022-01-27 MED ORDER — ALUM & MAG HYDROXIDE-SIMETH 200-200-20 MG/5ML PO SUSP: 30.0000 mL | Freq: Four times a day (QID) | ORAL | Status: DC | PRN

## 2022-01-27 MED ORDER — ACETAMINOPHEN 500 MG PO TABS
1000.0000 mg | ORAL_TABLET | ORAL | Status: AC
  Administered 2022-01-27: 1000 mg via ORAL
  Filled 2022-01-27: qty 2

## 2022-01-27 MED ORDER — MEPERIDINE HCL 50 MG/ML IJ SOLN: 6.2500 mg | INTRAMUSCULAR | Status: DC | PRN

## 2022-01-27 MED ORDER — ALVIMOPAN 12 MG PO CAPS
12.0000 mg | ORAL_CAPSULE | Freq: Two times a day (BID) | ORAL | Status: DC
  Administered 2022-01-28 – 2022-01-29 (×3): 12 mg via ORAL
  Filled 2022-01-27 (×3): qty 1

## 2022-01-27 MED ORDER — BUPIVACAINE-EPINEPHRINE 0.25% -1:200000 IJ SOLN
INTRAMUSCULAR | Status: DC | PRN
Start: 2022-01-27 — End: 2022-01-27
  Administered 2022-01-27: 30 mL

## 2022-01-27 MED ORDER — GABAPENTIN 300 MG PO CAPS
300.0000 mg | ORAL_CAPSULE | ORAL | Status: AC
  Administered 2022-01-27: 300 mg via ORAL
  Filled 2022-01-27: qty 1

## 2022-01-27 MED ORDER — BISACODYL 5 MG PO TBEC: 20.0000 mg | DELAYED_RELEASE_TABLET | Freq: Once | ORAL | Status: DC

## 2022-01-27 MED ORDER — HYDROMORPHONE HCL 1 MG/ML IJ SOLN: 0.2500 mg | INTRAMUSCULAR | Status: DC | PRN

## 2022-01-27 MED ORDER — PROPOFOL 10 MG/ML IV BOLUS
INTRAVENOUS | Status: AC
Start: 2022-01-27 — End: ?
  Filled 2022-01-27: qty 20

## 2022-01-27 SURGICAL SUPPLY — 95 items
BAG COUNTER SPONGE SURGICOUNT (BAG) ×2 IMPLANT
BLADE EXTENDED COATED 6.5IN (ELECTRODE) IMPLANT
CANNULA REDUC XI 12-8 STAPL (CANNULA)
CANNULA REDUCER 12-8 DVNC XI (CANNULA) IMPLANT
CELLS DAT CNTRL 66122 CELL SVR (MISCELLANEOUS) IMPLANT
COVER SURGICAL LIGHT HANDLE (MISCELLANEOUS) ×4 IMPLANT
COVER TIP SHEARS 8 DVNC (MISCELLANEOUS) ×1 IMPLANT
COVER TIP SHEARS 8MM DA VINCI (MISCELLANEOUS) ×2
DRAIN CHANNEL 19F RND (DRAIN) IMPLANT
DRAPE ARM DVNC X/XI (DISPOSABLE) ×4 IMPLANT
DRAPE COLUMN DVNC XI (DISPOSABLE) ×1 IMPLANT
DRAPE DA VINCI XI ARM (DISPOSABLE) ×8
DRAPE DA VINCI XI COLUMN (DISPOSABLE) ×2
DRAPE SURG IRRIG POUCH 19X23 (DRAPES) ×2 IMPLANT
DRSG OPSITE POSTOP 3X4 (GAUZE/BANDAGES/DRESSINGS) ×1 IMPLANT
DRSG OPSITE POSTOP 4X10 (GAUZE/BANDAGES/DRESSINGS) IMPLANT
DRSG OPSITE POSTOP 4X6 (GAUZE/BANDAGES/DRESSINGS) IMPLANT
DRSG OPSITE POSTOP 4X8 (GAUZE/BANDAGES/DRESSINGS) IMPLANT
ELECT PENCIL ROCKER SW 15FT (MISCELLANEOUS) ×2 IMPLANT
ELECT REM PT RETURN 15FT ADLT (MISCELLANEOUS) ×2 IMPLANT
ENDOLOOP SUT PDS II  0 18 (SUTURE)
ENDOLOOP SUT PDS II 0 18 (SUTURE) IMPLANT
EVACUATOR SILICONE 100CC (DRAIN) IMPLANT
GLOVE SURG ENC MOIS LTX SZ6.5 (GLOVE) ×6 IMPLANT
GLOVE SURG UNDER LTX SZ6.5 (GLOVE) ×2 IMPLANT
GLOVE SURG UNDER POLY LF SZ7 (GLOVE) ×4 IMPLANT
GOWN STRL REUS W/TWL XL LVL3 (GOWN DISPOSABLE) ×6 IMPLANT
GRASPER SUT TROCAR 14GX15 (MISCELLANEOUS) IMPLANT
HOLDER FOLEY CATH W/STRAP (MISCELLANEOUS) ×2 IMPLANT
IRRIG SUCT STRYKERFLOW 2 WTIP (MISCELLANEOUS) ×2
IRRIGATION SUCT STRKRFLW 2 WTP (MISCELLANEOUS) ×1 IMPLANT
KIT PROCEDURE DA VINCI SI (MISCELLANEOUS) ×2
KIT PROCEDURE DVNC SI (MISCELLANEOUS) IMPLANT
KIT TURNOVER KIT A (KITS) IMPLANT
NDL INSUFFLATION 14GA 120MM (NEEDLE) ×1 IMPLANT
NEEDLE INSUFFLATION 14GA 120MM (NEEDLE) ×2 IMPLANT
PACK CARDIOVASCULAR III (CUSTOM PROCEDURE TRAY) ×2 IMPLANT
PACK COLON (CUSTOM PROCEDURE TRAY) ×2 IMPLANT
PAD POSITIONING PINK XL (MISCELLANEOUS) ×2 IMPLANT
RELOAD STAPLE 60 2.5 WHT DVNC (STAPLE) IMPLANT
RELOAD STAPLE 60 3.5 BLU DVNC (STAPLE) IMPLANT
RELOAD STAPLE 60 4.3 GRN DVNC (STAPLE) IMPLANT
RELOAD STAPLER 2.5X60 WHT DVNC (STAPLE) ×1 IMPLANT
RELOAD STAPLER 3.5X60 BLU DVNC (STAPLE) ×2 IMPLANT
RELOAD STAPLER 4.3X60 GRN DVNC (STAPLE) IMPLANT
RETRACTOR WND ALEXIS 18 MED (MISCELLANEOUS) IMPLANT
RTRCTR WOUND ALEXIS 18CM MED (MISCELLANEOUS)
SCISSORS LAP 5X35 DISP (ENDOMECHANICALS) IMPLANT
SEAL CANN UNIV 5-8 DVNC XI (MISCELLANEOUS) ×3 IMPLANT
SEAL XI 5MM-8MM UNIVERSAL (MISCELLANEOUS) ×6
SEALER VESSEL DA VINCI XI (MISCELLANEOUS) ×4
SEALER VESSEL EXT DVNC XI (MISCELLANEOUS) ×1 IMPLANT
SOLUTION ELECTROLUBE (MISCELLANEOUS) ×2 IMPLANT
SPIKE FLUID TRANSFER (MISCELLANEOUS) IMPLANT
STAPLER 60 DA VINCI SURE FORM (STAPLE) ×2
STAPLER 60 SUREFORM DVNC (STAPLE) IMPLANT
STAPLER CANNULA SEAL DVNC XI (STAPLE) IMPLANT
STAPLER CANNULA SEAL XI (STAPLE) ×2
STAPLER ECHELON POWER CIR 29 (STAPLE) IMPLANT
STAPLER ECHELON POWER CIR 31 (STAPLE) IMPLANT
STAPLER RELOAD 2.5X60 WHITE (STAPLE) ×2
STAPLER RELOAD 2.5X60 WHT DVNC (STAPLE) ×1
STAPLER RELOAD 3.5X60 BLU DVNC (STAPLE) ×2
STAPLER RELOAD 3.5X60 BLUE (STAPLE) ×4
STAPLER RELOAD 4.3X60 GREEN (STAPLE)
STAPLER RELOAD 4.3X60 GRN DVNC (STAPLE)
STOPCOCK 4 WAY LG BORE MALE ST (IV SETS) ×4 IMPLANT
SUT ETHILON 2 0 PS N (SUTURE) IMPLANT
SUT NOVA NAB DX-16 0-1 5-0 T12 (SUTURE) ×4 IMPLANT
SUT PROLENE 2 0 KS (SUTURE) IMPLANT
SUT SILK 2 0 (SUTURE) ×2
SUT SILK 2 0 SH CR/8 (SUTURE) IMPLANT
SUT SILK 2-0 18XBRD TIE 12 (SUTURE) ×1 IMPLANT
SUT SILK 3 0 (SUTURE)
SUT SILK 3 0 SH CR/8 (SUTURE) ×2 IMPLANT
SUT SILK 3-0 18XBRD TIE 12 (SUTURE) IMPLANT
SUT V-LOC BARB 180 2/0GR6 GS22 (SUTURE)
SUT VIC AB 2-0 SH 18 (SUTURE) IMPLANT
SUT VIC AB 2-0 SH 27 (SUTURE)
SUT VIC AB 2-0 SH 27X BRD (SUTURE) IMPLANT
SUT VIC AB 3-0 SH 18 (SUTURE) IMPLANT
SUT VIC AB 4-0 PS2 27 (SUTURE) ×4 IMPLANT
SUT VICRYL 0 UR6 27IN ABS (SUTURE) ×2 IMPLANT
SUTURE V-LC BRB 180 2/0GR6GS22 (SUTURE) IMPLANT
SYR 10ML ECCENTRIC (SYRINGE) ×2 IMPLANT
SYS LAPSCP GELPORT 120MM (MISCELLANEOUS)
SYS WOUND ALEXIS 18CM MED (MISCELLANEOUS)
SYSTEM LAPSCP GELPORT 120MM (MISCELLANEOUS) IMPLANT
SYSTEM WOUND ALEXIS 18CM MED (MISCELLANEOUS) IMPLANT
TOWEL OR 17X26 10 PK STRL BLUE (TOWEL DISPOSABLE) ×1 IMPLANT
TOWEL OR NON WOVEN STRL DISP B (DISPOSABLE) ×2 IMPLANT
TRAY FOLEY MTR SLVR 16FR STAT (SET/KITS/TRAYS/PACK) ×2 IMPLANT
TROCAR ADV FIXATION 5X100MM (TROCAR) ×2 IMPLANT
TUBING CONNECTING 10 (TUBING) ×4 IMPLANT
TUBING INSUFFLATION 10FT LAP (TUBING) ×2 IMPLANT

## 2022-01-27 NOTE — Transfer of Care (Signed)
Immediate Anesthesia Transfer of Care Note ? ?Patient: Darin Simmons ? ?Procedure(s) Performed: XI ROBOT ASSISTED LAPAROSCOPIC PARTIAL COLECTOMY ? ?Patient Location: PACU ? ?Anesthesia Type:General ? ?Level of Consciousness: drowsy ? ?Airway & Oxygen Therapy: Patient Spontanous Breathing and Patient connected to face mask ? ?Post-op Assessment: Report given to RN and Post -op Vital signs reviewed and stable ? ?Post vital signs: Reviewed and stable ? ?Last Vitals:  ?Vitals Value Taken Time  ?BP 155/73 01/27/22 1506  ?Temp 36.4 ?C 01/27/22 1506  ?Pulse 62 01/27/22 1509  ?Resp 12 01/27/22 1509  ?SpO2 100 % 01/27/22 1509  ?Vitals shown include unvalidated device data. ? ?Last Pain:  ?Vitals:  ? 01/27/22 1054  ?TempSrc: Oral  ?   ? ?  ? ?Complications: No notable events documented. ?

## 2022-01-27 NOTE — Anesthesia Procedure Notes (Signed)
Procedure Name: Intubation ?Date/Time: 01/27/2022 12:51 PM ?Performed by: Rosaland Lao, CRNA ?Pre-anesthesia Checklist: Patient identified, Emergency Drugs available, Suction available and Patient being monitored ?Patient Re-evaluated:Patient Re-evaluated prior to induction ?Oxygen Delivery Method: Circle system utilized ?Preoxygenation: Pre-oxygenation with 100% oxygen ?Induction Type: IV induction ?Ventilation: Mask ventilation without difficulty ?Laryngoscope Size: Sabra Heck and 3 ?Grade View: Grade I ?Tube type: Oral ?Tube size: 7.0 mm ?Number of attempts: 1 ?Airway Equipment and Method: Stylet ?Placement Confirmation: ETT inserted through vocal cords under direct vision, positive ETCO2 and breath sounds checked- equal and bilateral ?Secured at: 22 cm ?Tube secured with: Tape ?Dental Injury: Teeth and Oropharynx as per pre-operative assessment  ? ? ? ? ?

## 2022-01-27 NOTE — Anesthesia Postprocedure Evaluation (Signed)
Anesthesia Post Note ? ?Patient: Darin Simmons ? ?Procedure(s) Performed: XI ROBOT ASSISTED LAPAROSCOPIC PARTIAL COLECTOMY ? ?  ? ?Patient location during evaluation: PACU ?Anesthesia Type: General ?Level of consciousness: awake and alert, patient cooperative and oriented ?Pain management: pain level controlled ?Vital Signs Assessment: post-procedure vital signs reviewed and stable ?Respiratory status: spontaneous breathing, nonlabored ventilation and respiratory function stable ?Cardiovascular status: blood pressure returned to baseline and stable ?Postop Assessment: no apparent nausea or vomiting ?Anesthetic complications: no ? ? ?No notable events documented. ? ?Last Vitals:  ?Vitals:  ? 01/27/22 1530 01/27/22 1545  ?BP: 137/79 137/66  ?Pulse: 65 61  ?Resp: 10 12  ?Temp:    ?SpO2: 100% 100%  ?  ?Last Pain:  ?Vitals:  ? 01/27/22 1545  ?TempSrc:   ?PainSc: 0-No pain  ? ? ?  ?  ?  ?  ?  ?  ? ?Rahn Lacuesta,E. Mercia Dowe ? ? ? ? ?

## 2022-01-27 NOTE — Interval H&P Note (Signed)
History and Physical Interval Note: ? ?01/27/2022 ?11:30 AM ? ?Neomia Dear  has presented today for surgery, with the diagnosis of COLON CANCER.  The various methods of treatment have been discussed with the patient and family. After consideration of risks, benefits and other options for treatment, the patient has consented to  Procedure(s): ?XI [place] (N/A) as a surgical intervention.  The patient's history has been reviewed, patient examined, no change in status, stable for surgery.  I have reviewed the patient's chart and labs.  Questions were answered to the patient's satisfaction.   ? ? ?Rosario Adie, MD ? ?Colorectal and General Surgery ?[facility]  ? ? ?

## 2022-01-27 NOTE — Op Note (Signed)
01/27/2022 ? ?2:56 PM ? ?PATIENT:  Darin Simmons  81 y.o. male ? ?Patient Care Team: ?London Pepper, MD as PCP - General (Family Medicine) ?Jettie Booze, MD as PCP - Cardiology (Cardiology) ? ?PRE-OPERATIVE DIAGNOSIS:  COLON CANCER ? ?POST-OPERATIVE DIAGNOSIS:  COLON CANCER ? ?PROCEDURE:  XI ROBOT ASSISTED RIGHT COLECTOMY ? ?  Surgeon(s): ?Leighton Ruff, MD ?Ileana Roup, MD ? ?ASSISTANT: Dr Dema Severin  ? ?ANESTHESIA:   local and general ? ?EBL: 81m Total I/O ?In: 1700 [I.V.:1700] ?Out: 50 [Blood:50] ? ?Delay start of Pharmacological VTE agent (>24hrs) due to surgical blood loss or risk of bleeding:  no ? ?DRAINS: none  ? ?SPECIMEN:  Source of Specimen:  R colon ? ?DISPOSITION OF SPECIMEN:  PATHOLOGY ? ?COUNTS:  YES ? ?PLAN OF CARE: Admit to inpatient  ? ?PATIENT DISPOSITION:  PACU - hemodynamically stable. ? ?INDICATION:   ? ?81y.o. M with hematochezia and weight loss.  Colonoscopy showed a cecal mass.  Biopsy showed adenocarcinoma.  I recommended segmental resection: ? ?The anatomy & physiology of the digestive tract was discussed.  The pathophysiology was discussed.  Natural history risks without surgery was discussed.   I worked to give an overview of the disease and the frequent need to have multispecialty involvement.  I feel the risks of no intervention will lead to serious problems that outweigh the operative risks; therefore, I recommended a partial colectomy to remove the pathology.  Laparoscopic & open techniques were discussed.   ?Risks such as bleeding, infection, abscess, leak, reoperation, possible ostomy, hernia, heart attack, death, and other risks were discussed.  I noted a good likelihood this will help address the problem.   Goals of post-operative recovery were discussed as well.   ? ?The patient expressed understanding & wished to proceed with surgery. ? ?OR FINDINGS:  ? ?No obvious metastatic disease on visceral parietal peritoneum or liver. ? ?DESCRIPTION:  ? ?Informed consent was  confirmed.  The patient underwent general anaesthesia without difficulty.  The patient was positioned appropriately.  VTE prevention in place.  The patient's abdomen was clipped, prepped, & draped in a sterile fashion.  Surgical timeout confirmed our plan. ? ?The patient was positioned in reverse Trendelenburg.  Abdominal entry was gained using a Varies needle in the LUQ.  Entry was clean.  I induced carbon dioxide insufflation.  An 888mrobotic port was placed in the LUQ.  Camera inspection revealed no injury.  Extra ports were carefully placed under direct laparoscopic visualization.  I laparoscopically reflected the greater omentum and the upper abdomen the small bowel in the peilvis. The patient was appropriately positioned and the robot was docked to the patient's right side.  Instruments were placed under direct visualization.  ? ? I began by identifying the ileocolic artery and vein within the mesentery. Dissection was bluntly carried around these structures. The duodenum was identified and free from the structures. I then separated the structures bluntly and used the robotic vessel sealer device to transect these.  I developed the retroperitoneal plane bluntly.  I then freed the appendix off its attachments to the pelvic wall. I mobilized the terminal ileum.  I took care to avoid injuring any retroperitoneal structures.  After this I began to mobilize laterally down the white line of Toldt and then took down the hepatic flexure using the Enseal device. I mobilized the omentum off of the right transverse colon. The entire colon was then flipped medially and mobilized off of the retroperitoneal structures until I  could visualize the lateral edge of the duodenum underneath.  I gently freed the duodenal attachments.  ? ?I identified a portion of mesentery of the transverse colon just proximal to the right branch of the middle colic.  I divided up to the colon from the previous dissection of the mesentery using  the robotic vessel sealer.  I then divided the terminal ileal mesentery in similar fashion.  At that point, the terminal ileum was divided with a blue load robotic 60 mm stapler.  The transverse colon was divided with a blue load robotic stapler.  The specimen was then completely free and placed in the right upper quadrant.  Hemostasis was good.  I then oriented the remaining terminal ileum and transverse colon and a isoperistaltic fashion.  I placed an enterotomy in the small bowel and colon using the robotic scissors.  I then introduced a white load 60 mm robotic stapler into both enterotomies and created an anastomosis between the small bowel and transverse colon.  Hemostasis within the staple line was good.  The common enterotomy channel was closed using 2 running 2-0 V-Loc sutures.  The abdomen was then irrigated with normal saline. The omentum was then brought down over the anastomosis.  At this point the robot was undocked.  The 12 mm suprapubic port was enlarged to a Pfannenstiel incision and an [place] wound protector was placed.  The specimen was removed from the abdomen and evaluated.  Once the abdomen was inspected for hemostasis, the [place] wound protector was removed.   ? ?The peritoneum of the Pfannenstiel incision was closed using a running 0 Vicryl suture.  The fascia was then closed using 2 running #1 Novafil sutures.  The subcutaneous tissue of the extraction incision was closed using a running 2-0 Vicryl suture. The skin was then closed using running subcuticular 4-0 Vicryl sutures.  A sterile dressing was applied.  The remaining port sites were closed using interrupted 4-0 Vicryl sutures and Dermabond. All counts were correct per operating room staff. The patient was then awakened from anesthesia and sent to the post anesthesia care unit in stable condition.    ? ?Rosario Adie, MD ? ?Colorectal and General Surgery ?[facility]  ?

## 2022-01-28 LAB — BASIC METABOLIC PANEL
Anion gap: 6 (ref 5–15)
BUN: 9 mg/dL (ref 8–23)
CO2: 24 mmol/L (ref 22–32)
Calcium: 8.3 mg/dL — ABNORMAL LOW (ref 8.9–10.3)
Chloride: 103 mmol/L (ref 98–111)
Creatinine, Ser: 0.51 mg/dL — ABNORMAL LOW (ref 0.61–1.24)
GFR, Estimated: >60 mL/min (ref >60)
Glucose, Bld: 149 mg/dL — ABNORMAL HIGH (ref 70–99)
Potassium: 4.1 mmol/L (ref 3.5–5.1)
Sodium: 133 mmol/L — ABNORMAL LOW (ref 135–145)

## 2022-01-28 LAB — CBC
HCT: 38.7 % — ABNORMAL LOW (ref 39.0–52.0)
Hemoglobin: 13.6 g/dL (ref 13.0–17.0)
MCH: 32 pg (ref 26.0–34.0)
MCHC: 35.1 g/dL (ref 30.0–36.0)
MCV: 91.1 fL (ref 80.0–100.0)
Platelets: 107 10*3/uL — ABNORMAL LOW (ref 150–400)
RBC: 4.25 MIL/uL (ref 4.22–5.81)
RDW: 12.9 % (ref 11.5–15.5)
WBC: 9.9 10*3/uL (ref 4.0–10.5)
nRBC: 0 % (ref 0.0–0.2)

## 2022-01-28 NOTE — Progress Notes (Signed)
Mobility Specialist - Progress Note ? ? ? 01/28/22 1300  ?Mobility  ?Activity Ambulated with assistance in hallway;Ambulated with assistance in room  ?Level of Assistance Contact guard assist, steadying assist  ?Assistive Device Front wheel walker  ?Distance Ambulated (ft) 550 ft  ?Activity Response Tolerated well  ?$Mobility charge 1 Mobility  ? ?Pt ambulating with NT upon arrival, but was agreeable to ambulate with mobility specialist shortly after. Pt ambulated 550 ft, no complaints made during session. Pt returned to recliner after session and left with call bell in reach. Will check back to ambulate as schedule permits.  ? ?Khyler Eschmann S. ?Mobility Specialist ?Acute Rehabilitation Services ?Phone: [phone] ?01/28/22, 1:31 PM ? ? ? ? ?

## 2022-01-28 NOTE — Progress Notes (Signed)
. ?  Transition of Care (TOC) Screening Note ? ? ?Patient Details  ?Name: Darin Simmons ?Date of Birth: 1941/09/04 ? ? ?Transition of Care (TOC) CM/SW Contact:    ?Eian Vandervelden, LCSW ?Phone Number: ?01/28/2022, 10:21 AM ? ? ? ?Transition of Care Department [facility]) has reviewed patient and no TOC needs have been identified at this time. We will continue to monitor patient advancement through interdisciplinary progression rounds. If new patient transition needs arise, please place a TOC consult. ? ? ?

## 2022-01-28 NOTE — Progress Notes (Signed)
Mobility Specialist - Progress Note ? ? ? 01/28/22 1656  ?Mobility  ?Activity Ambulated with assistance in hallway;Ambulated with assistance in room  ?Level of Assistance Standby assist, set-up cues, supervision of patient - no hands on  ?Assistive Device Front wheel walker  ?Distance Ambulated (ft) 400 ft  ?Activity Response Tolerated well  ?$Mobility charge 1 Mobility  ? ?Pt eager to mobilize, required RW to ambulate ~400 ft in hallway. No reports of pain or dizziness. No unsteadiness or LOB experienced. Pt returned to room at EOS and left in recliner with call bell in reach.  ? ?Felesha Moncrieffe S. ?Mobility Specialist ?Acute Rehabilitation Services ?Phone: [phone] ?01/28/22, 4:59 PM ? ? ? ? ?

## 2022-01-28 NOTE — Progress Notes (Signed)
1 Day Post-Op Robotic R colectomy ?Subjective: ?Min pain, no nausea.  Tolerating clears.  Ambulating in hall. ? ?Objective: ?Vital signs in last 24 hours: ?Temp:  [97.4 ?F (36.3 ?C)-97.7 ?F (36.5 ?C)] 97.5 ?F (36.4 ?C) (03/09 0544) ?Pulse Rate:  [54-79] 54 (03/09 0544) ?Resp:  [10-18] 17 (03/09 0544) ?BP: (107-155)/(60-85) 132/68 (03/09 0544) ?SpO2:  [98 %-100 %] 100 % (03/09 0544) ?Weight:  [60.7 kg-62.4 kg] 60.7 kg (03/09 0544) ? ? ?Intake/Output from previous day: ?03/08 0701 - 03/09 0700 ?In: 1925.6 [I.V.:1925.6] ?Out: 3350 [Urine:3300; Blood:50] ?Intake/Output this shift: ?No intake/output data recorded. ? ? ?General appearance: alert and cooperative ?GI: normal findings: soft, non-tender ? ?Incision: no significant drainage ? ?Lab Results:  ?Recent Labs  ?  01/28/22 ?0411  ?WBC 9.9  ?HGB 13.6  ?HCT 38.7*  ?PLT 107*  ? ?BMET ?Recent Labs  ?  01/28/22 ?0411  ?NA 133*  ?K 4.1  ?CL 103  ?CO2 24  ?GLUCOSE 149*  ?BUN 9  ?CREATININE 0.51*  ?CALCIUM 8.3*  ? ?PT/INR ?No results for input(s): LABPROT, INR in the last 72 hours. ?ABG ?No results for input(s): PHART, HCO3 in the last 72 hours. ? ?Invalid input(s): PCO2, PO2 ? ?MEDS, Scheduled ? acetaminophen  1,000 mg Oral Q6H  ? alvimopan  12 mg Oral BID  ? enoxaparin (LOVENOX) injection  40 mg Subcutaneous Q24H  ? feeding supplement  237 mL Oral BID BM  ? saccharomyces boulardii  250 mg Oral BID  ? ? ?Studies/Results: ?No results found. ? ?Assessment: ?s/p Procedure(s): ?XI ROBOT ASSISTED LAPAROSCOPIC PARTIAL COLECTOMY ?Patient Active Problem List  ? Diagnosis Date Noted  ? Colon cancer ([place]) 01/27/2022  ? Hypothyroidism 02/17/2021  ? 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 Aortic atherosclerosis ([place]) 02/13/2015  ? Coronary atherosclerosis of native coronary artery 01/01/2014  ? Mixed hyperlipidemia 01/01/2014  ? Aortic stenosis 01/21/2011  ? ? ?Expected post op course ? ?Plan: ?d/c foley ?Advance diet to  fulls ?Ambulate ? ? LOS: 1 day  ? ? ? ?Marland KitchenRosario Adie, MD ?[facility], [place] ? ? ? ?01/28/2022 ?7:21 AM ? ? ? ?  ?

## 2022-01-29 LAB — BASIC METABOLIC PANEL
Anion gap: 6 (ref 5–15)
BUN: 9 mg/dL (ref 8–23)
CO2: 26 mmol/L (ref 22–32)
Calcium: 8.6 mg/dL — ABNORMAL LOW (ref 8.9–10.3)
Chloride: 104 mmol/L (ref 98–111)
Creatinine, Ser: 0.54 mg/dL — ABNORMAL LOW (ref 0.61–1.24)
GFR, Estimated: >60 mL/min (ref >60)
Glucose, Bld: 93 mg/dL (ref 70–99)
Potassium: 4 mmol/L (ref 3.5–5.1)
Sodium: 136 mmol/L (ref 135–145)

## 2022-01-29 LAB — CBC
HCT: 39.2 % (ref 39.0–52.0)
Hemoglobin: 13.7 g/dL (ref 13.0–17.0)
MCH: 32.3 pg (ref 26.0–34.0)
MCHC: 34.9 g/dL (ref 30.0–36.0)
MCV: 92.5 fL (ref 80.0–100.0)
Platelets: 106 10*3/uL — ABNORMAL LOW (ref 150–400)
RBC: 4.24 MIL/uL (ref 4.22–5.81)
RDW: 12.9 % (ref 11.5–15.5)
WBC: 8 10*3/uL (ref 4.0–10.5)
nRBC: 0 % (ref 0.0–0.2)

## 2022-01-29 NOTE — Progress Notes (Signed)
Mobility Specialist - Progress Note ? ? ? 01/29/22 1058  ?Mobility  ?Activity Ambulated with assistance in hallway  ?Level of Assistance Standby assist, set-up cues, supervision of patient - no hands on  ?Assistive Device Front wheel walker  ?Distance Ambulated (ft) 400 ft  ?Activity Response Tolerated well  ?$Mobility charge 1 Mobility  ? ?Pt eager to mobilize this morning and used RW to ambulate ~400 ft. Pt required a few standing rest breaks, c/o of abdominal pain, and would reach for surgical site secondary to pain. Pt was encouraged to not overdo mobility due to abdominal pain and shortly returned to room. Pt was left sitting EOB with breakfast on table and call bell at side.  ? ?Kathaleya Mcduffee S. ?Mobility Specialist ?Acute Rehabilitation Services ?Phone: [phone] ?01/29/22, 11:01 AM ? ? ? ? ?

## 2022-01-29 NOTE — Progress Notes (Signed)
2 Days Post-Op Robotic R colectomy ?Subjective: ?A little more pain yesterday, no nausea.  Tolerating fulls.  Ambulating in hall. ? ?Objective: ?Vital signs in last 24 hours: ?Temp:  [97.4 ?F (36.3 ?C)-98.1 ?F (36.7 ?C)] 98.1 ?F (36.7 ?C) (03/10 0458) ?Pulse Rate:  [58-62] 58 (03/10 0458) ?Resp:  [16-18] 16 (03/10 0458) ?BP: (122-137)/(67-90) 136/67 (03/10 0458) ?SpO2:  [95 %-100 %] 95 % (03/10 0458) ?Weight:  [59.3 kg] 59.3 kg (03/10 0458) ? ? ?Intake/Output from previous day: ?03/09 0701 - 03/10 0700 ?In: 1800 [P.O.:1800] ?Out: 804 [Urine:803; Stool:1] ?Intake/Output this shift: ?Total I/O ?In: 240 [P.O.:240] ?Out: 0  ? ? ?General appearance: alert and cooperative ?GI: normal findings: soft, non-tender ? ?Incision: no significant drainage ? ?Lab Results:  ?Recent Labs  ?  01/28/22 ?0411 01/29/22 ?1025  ?WBC 9.9 8.0  ?HGB 13.6 13.7  ?HCT 38.7* 39.2  ?PLT 107* 106*  ? ? ?BMET ?Recent Labs  ?  01/28/22 ?0411 01/29/22 ?0418  ?NA 133* 136  ?K 4.1 4.0  ?CL 103 104  ?CO2 24 26  ?GLUCOSE 149* 93  ?BUN 9 9  ?CREATININE 0.51* 0.54*  ?CALCIUM 8.3* 8.6*  ? ? ?PT/INR ?No results for input(s): LABPROT, INR in the last 72 hours. ?ABG ?No results for input(s): PHART, HCO3 in the last 72 hours. ? ?Invalid input(s): PCO2, PO2 ? ?MEDS, Scheduled ? acetaminophen  1,000 mg Oral Q6H  ? alvimopan  12 mg Oral BID  ? enoxaparin (LOVENOX) injection  40 mg Subcutaneous Q24H  ? feeding supplement  237 mL Oral BID BM  ? saccharomyces boulardii  250 mg Oral BID  ? ? ?Studies/Results: ?No results found. ? ?Assessment: ?s/p Procedure(s): ?XI ROBOT ASSISTED LAPAROSCOPIC PARTIAL COLECTOMY ?Patient Active Problem List  ? Diagnosis Date Noted  ? Colon cancer ([place]) 01/27/2022  ? Hypothyroidism 02/17/2021  ? 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 Aortic atherosclerosis ([place]) 02/13/2015  ? Coronary atherosclerosis of native coronary artery 01/01/2014  ? Mixed hyperlipidemia 01/01/2014  ? Aortic  stenosis 01/21/2011  ? ? ?Expected post op course ? ?Plan: ?Advance diet to soft foods ?Ambulate ? ? LOS: 2 days  ? ? ? ?Marland KitchenRosario Adie, MD ?[facility], [place] ? ? ? ?01/29/2022 ?10:14 AM ? ? ? ?  ?

## 2022-01-30 MED ORDER — ACETAMINOPHEN 500 MG PO TABS: 1000.0000 mg | ORAL_TABLET | Freq: Four times a day (QID) | ORAL | 0 refills | Status: DC | PRN

## 2022-01-30 NOTE — Progress Notes (Signed)
Assessment unchanged. Verbalized understanding of dc instructions through teach back including medications. Discharged via wc to front entrance accompanied by NT.  ?

## 2022-01-30 NOTE — Discharge Summary (Signed)
Physician Discharge Summary  ?Patient ID: ?Darin Simmons ?MRN: [id number] ?DOB/AGE: 79/30/1942 79 y.o. ? ?Admit date: 01/27/2022 ?Discharge date: 01/30/2022 ? ?Admission Diagnoses: Colon cancer ? ?Discharge Diagnoses:  ?Principal Problem: ?  Colon cancer ([place]) ? ? ?Discharged Condition: good ? ?Hospital Course: Patient was admitted to the med surg floor after surgery.  Diet was advanced as tolerated.  Patient began to have bowel function on postop day 2.  By postop day 3, he was tolerating a solid diet and pain was controlled with oral medications.  He was urinating without difficulty and ambulating without assistance.  Patient was felt to be in stable condition for discharge to home. ? ? ?Consults: None ? ?Significant Diagnostic Studies: labs: cbc, bmet ? ?Treatments: IV hydration, analgesia: acetaminophen, and surgery: Robotic R colectomy ? ?Discharge Exam: ?Blood pressure (!) 141/80, pulse 60, temperature (!) 97.5 ?F (36.4 ?C), temperature source Oral, resp. rate 14, height '5\' 10"'$  (1.778 m), weight 58.2 kg, SpO2 98 %. ?General appearance: alert and cooperative ?GI: soft, non-distended ?Incision/Wound: clean, dry, intact ? ?Disposition: Discharge disposition: 01-Home or Self Care ? ? ? ? ? ? ? ?Allergies as of 01/30/2022   ? ?   Reactions  ? Bee Venom Shortness Of Breath, Swelling  ? Crestor [rosuvastatin] Other (See Comments)  ? MYALGIAS  ? Diphenhydramine Hcl Other (See Comments)  ? Shaky legs  ? Statins Other (See Comments)  ? MYALGIAS ?MUSCLE PAIN  ? Diphenhydramine Hcl Other (See Comments)  ? Other Other (See Comments)  ? ?  ? ?  ?Medication List  ?  ? ?TAKE these medications   ? ?acetaminophen 500 MG tablet ?Commonly known as: TYLENOL ?Take 2 tablets (1,000 mg total) by mouth every 6 (six) hours as needed. ?  ?Alpha-Lipoic Acid 200 MG Tabs ?Take 200-400 mg by mouth daily. ?  ?arginine 500 MG tablet ?Take 500 mg by mouth daily. ?  ?b complex vitamins capsule ?Take 1 capsule by mouth daily. ?  ?Bilberry 150 MG  Caps ?Take 150 mg by mouth daily. ?  ?Chromium Picolinate 200 MCG Tabs ?Take 200 mcg by mouth daily. ?  ?Co Q-10 100 MG Caps ?Take 100 mg by mouth in the morning and at bedtime. ?  ?Ginkgo Biloba 60 MG Tabs ?Take 60 mg by mouth in the morning and at bedtime. ?  ?GRAPE SEED PO ?Take 300 mg by mouth daily. ?  ?[place] 565 MG Caps ?Take 565 mg by mouth in the morning and at bedtime. ?  ?Lutein 20 MG Caps ?Take 20 mg by mouth daily. ?  ?Lysine 1000 MG Tabs ?Take 1,000 mg by mouth 2 (two) times a week. ?  ?Magnesium 250 MG Tabs ?Take 250 mg by mouth in the morning and at bedtime. ?  ?milk thistle 175 MG tablet ?Take 175 mg by mouth in the morning and at bedtime. ?  ?NAC 600 PO ?Take 600 mg by mouth daily. ?  ?OVER THE COUNTER MEDICATION ?Take 3 capsules by mouth daily. Rejuvenzyme - Systemic Enzyme ?  ?PINE BARK (PYCNOGENOL) PO ?Take 150 mg by mouth daily. ?  ?Potassium 99 MG Tabs ?Take 99 mg by mouth daily. ?  ?Prostate Control Caps ?Take 1 capsule by mouth daily. ?  ?QUERCETIN PO ?Take 475 mg by mouth daily. ?  ?Saw Palmetto 450 [place] ?Take 450 mg by mouth daily. ?  ?Selenium 200 MCG Caps ?Take 200 mcg by mouth daily. ?  ?vitamin B-12 1000 MCG tablet ?Commonly known as: CYANOCOBALAMIN ?Take 1,000  mcg by mouth daily. ?  ?vitamin C 1000 MG tablet ?Take 1,000 mg by mouth in the morning and at bedtime. ?  ?Vitamin D 50 MCG (2000 UT) Caps ?Take 2,000 Units by mouth in the morning and at bedtime. ?  ?Vitamin E 268 MG (400 UNIT) Tabs ?Take 200 Units by mouth daily. ?  ?Vitamin E 134 MG (200 UNIT) Tabs ?Take 200 Units by mouth daily. ?  ?zinc sulfate 220 (50 Zn) MG capsule ?Take 220 mg by mouth daily. ?  ? ?  ? ?  ?  ? ? ?  ?Durable Medical Equipment  ?(From admission, onward)  ?  ? ? ?  ? ?  Start     Ordered  ? 01/30/22 0743  For home use only DME Walker rolling  Once       ?Question Answer Comment  ?Walker: With 5 Inch Wheels   ?Patient needs a walker to treat with the following condition Post-operative pain   ?  ?  01/30/22 0742  ? ?  ?  ? ?  ? ? Follow-up Information   ? ? Leighton Ruff, MD. Schedule an appointment as soon as possible for a visit in 2 week(s).   ?Specialties: General Surgery, Colon and Rectal Surgery ?Contact information: ?[address] ?[address] ?[place] [address] ?[phone] ? ? ?  ?  ? ?  ?  ? ?  ? ? ?Signed: ?Rosario Adie ?01/30/2022, 7:43 AM ? ? ?

## 2022-01-30 NOTE — Discharge Instructions (Signed)
SURGERY: POST OP INSTRUCTIONS (Surgery for small bowel obstruction, colon resection, etc)   ######################################################################  EAT Gradually transition to a high fiber diet with a fiber supplement over the next few days after discharge  WALK Walk an hour a day.  Control your pain to do that.    CONTROL PAIN Control pain so that you can walk, sleep, tolerate sneezing/coughing, go up/down stairs.  HAVE A BOWEL MOVEMENT DAILY Keep your bowels regular to avoid problems.  OK to try a laxative to override constipation.  OK to use an antidairrheal to slow down diarrhea.  Call if not better after 2 tries  CALL IF YOU HAVE PROBLEMS/CONCERNS Call if you are still struggling despite following these instructions. Call if you have concerns not answered by these instructions  ######################################################################   DIET Follow a light diet the first few days at home.  Start with a bland diet such as soups, liquids, starchy foods, low fat foods, etc.  If you feel full, bloated, or constipated, stay on a ful liquid or pureed/blenderized diet for a few days until you feel better and no longer constipated. Be sure to drink plenty of fluids every day to avoid getting dehydrated (feeling dizzy, not urinating, etc.). Gradually add a fiber supplement to your diet over the next week.  Gradually get back to a regular solid diet.  Avoid fast food or heavy meals the first week as you are more likely to get nauseated. It is expected for your digestive tract to need a few months to get back to normal.  It is common for your bowel movements and stools to be irregular.  You will have occasional bloating and cramping that should eventually fade away.  Until you are eating solid food normally, off all pain medications, and back to regular activities; your bowels will not be normal. Focus on eating a low-fat, high fiber diet the rest of your life  (See Getting to Good Bowel Health, below).  CARE of your INCISION or WOUND  It is good for closed incisions and even open wounds to be washed every day.  Shower every day.  Short baths are fine.  Wash the incisions and wounds clean with soap & water.    You may leave closed incisions open to air if it is dry.   You may cover the incision with clean gauze & replace it after your daily shower for comfort.  STAPLES: You have skin staples.  Leave them in place & set up an appointment for them to be removed by a surgery office nurse ~10 days after surgery. = 1st week of January 2024    ACTIVITIES as tolerated Start light daily activities --- self-care, walking, climbing stairs-- beginning the day after surgery.  Gradually increase activities as tolerated.  Control your pain to be active.  Stop when you are tired.  Ideally, walk several times a day, eventually an hour a day.   Most people are back to most day-to-day activities in a few weeks.  It takes 4-8 weeks to get back to unrestricted, intense activity. If you can walk 30 minutes without difficulty, it is safe to try more intense activity such as jogging, treadmill, bicycling, low-impact aerobics, swimming, etc. Save the most intensive and strenuous activity for last (Usually 4-8 weeks after surgery) such as sit-ups, heavy lifting, contact sports, etc.  Refrain from any intense heavy lifting or straining until you are off narcotics for pain control.  You will have off days, but things should improve   week-by-week. DO NOT PUSH THROUGH PAIN.  Let pain be your guide: If it hurts to do something, don't do it.  Pain is your body warning you to avoid that activity for another week until the pain goes down. You may drive when you are no longer taking narcotic prescription pain medication, you can comfortably wear a seatbelt, and you can safely make sudden turns/stops to protect yourself without hesitating due to pain. You may have sexual intercourse when it  is comfortable. If it hurts to do something, stop.  MEDICATIONS Take your usually prescribed home medications unless otherwise directed.   Blood thinners:  Usually you can restart any strong blood thinners after the second postoperative day.  It is OK to take aspirin right away.     If you are on strong blood thinners (warfarin/Coumadin, Plavix, Xerelto, Eliquis, Pradaxa, etc), discuss with your surgeon, medicine PCP, and/or cardiologist for instructions on when to restart the blood thinner & if blood monitoring is needed (PT/INR blood check, etc).     PAIN CONTROL Pain after surgery or related to activity is often due to strain/injury to muscle, tendon, nerves and/or incisions.  This pain is usually short-term and will improve in a few months.  To help speed the process of healing and to get back to regular activity more quickly, DO THE FOLLOWING THINGS TOGETHER: Increase activity gradually.  DO NOT PUSH THROUGH PAIN Use Ice and/or Heat Try Gentle Massage and/or Stretching Take over the counter pain medication Take Narcotic prescription pain medication for more severe pain  Good pain control = faster recovery.  It is better to take more medicine to be more active than to stay in bed all day to avoid medications.  Increase activity gradually Avoid heavy lifting at first, then increase to lifting as tolerated over the next 6 weeks. Do not "push through" the pain.  Listen to your body and avoid positions and maneuvers than reproduce the pain.  Wait a few days before trying something more intense Walking an hour a day is encouraged to help your body recover faster and more safely.  Start slowly and stop when getting sore.  If you can walk 30 minutes without stopping or pain, you can try more intense activity (running, jogging, aerobics, cycling, swimming, treadmill, sex, sports, weightlifting, etc.) Remember: If it hurts to do it, then don't do it! Use Ice and/or Heat You will have swelling and  bruising around the incisions.  This will take several weeks to resolve. Ice packs or heating pads (6-8 times a day, 30-60 minutes at a time) will help sooth soreness & bruising. Some people prefer to use ice alone, heat alone, or alternate between ice & heat.  Experiment and see what works best for you.  Consider trying ice for the first few days to help decrease swelling and bruising; then, switch to heat to help relax sore spots and speed recovery. Shower every day.  Short baths are fine.  It feels good!  Keep the incisions and wounds clean with soap & water.   Try Gentle Massage and/or Stretching Massage at the area of pain many times a day Stop if you feel pain - do not overdo it Take over the counter pain medication This helps the muscle and nerve tissues become less irritable and calm down faster Choose ONE of the following over-the-counter anti-inflammatory medications: Acetaminophen 500mg tabs (Tylenol) 1-2 pills with every meal and just before bedtime (avoid if you have liver problems or if you have   acetaminophen in you narcotic prescription) Naproxen 220mg tabs (ex. Aleve, Naprosyn) 1-2 pills twice a day (avoid if you have kidney, stomach, IBD, or bleeding problems) Ibuprofen 200mg tabs (ex. Advil, Motrin) 3-4 pills with every meal and just before bedtime (avoid if you have kidney, stomach, IBD, or bleeding problems) Take with food/snack several times a day as directed for at least 2 weeks to help keep pain / soreness down & more manageable. Take Narcotic prescription pain medication for more severe pain A prescription for strong pain control is often given to you upon discharge (for example: oxycodone/Percocet, hydrocodone/Norco/Vicodin, or tramadol/Ultram) Take your pain medication as prescribed. Be mindful that most narcotic prescriptions contain Tylenol (acetaminophen) as well - avoid taking too much Tylenol. If you are having problems/concerns with the prescription medicine (does  not control pain, nausea, vomiting, rash, itching, etc.), please call us [phone] to see if we need to switch you to a different pain medicine that will work better for you and/or control your side effects better. If you need a refill on your pain medication, you must call the office before 4 pm and on weekdays only.  By federal law, prescriptions for narcotics cannot be called into a pharmacy.  They must be filled out on paper & picked up from our office by the patient or authorized caretaker.  Prescriptions cannot be filled after 4 pm nor on weekends.    WHEN TO CALL US [phone] Severe uncontrolled or worsening pain  Fever over 101 F (38.5 C) Concerns with the incision: Worsening pain, redness, rash/hives, swelling, bleeding, or drainage Reactions / problems with new medications (itching, rash, hives, nausea, etc.) Nausea and/or vomiting Difficulty urinating Difficulty breathing Worsening fatigue, dizziness, lightheadedness, blurred vision Other concerns If you are not getting better after two weeks or are noticing you are getting worse, contact our office [phone] for further advice.  We may need to adjust your medications, re-evaluate you in the office, send you to the emergency room, or see what other things we can do to help. The clinic staff is available to answer your questions during regular business hours (8:30am-5pm).  Please don't hesitate to call and ask to speak to one of our nurses for clinical concerns.    A surgeon from [facility] is always on call at the hospitals 24 hours/day If you have a medical emergency, go to the nearest emergency room or call 911.  FOLLOW UP in our office One the day of your discharge from the hospital (or the next business weekday), please call [facility] to set up or confirm an appointment to see your surgeon in the office for a follow-up appointment.  Usually it is 2-3 weeks after your surgery.   If you  have skin staples at your incision(s), let the office know so we can set up a time in the office for the nurse to remove them (usually around 10 days after surgery). Make sure that you call for appointments the day of discharge (or the next business weekday) from the hospital to ensure a convenient appointment time. IF YOU HAVE DISABILITY OR FAMILY LEAVE FORMS, BRING THEM TO THE OFFICE FOR PROCESSING.  DO NOT GIVE THEM TO YOUR DOCTOR.  [facility] [address], [address], [place], [place]  [address] ? [phone] - Main [phone] - Toll Free,  [phone] - Fax [email or web address]    GETTING TO GOOD BOWEL HEALTH. It is expected for your digestive tract to   need a few months to get back to normal.  It is common for your bowel movements and stools to be irregular.  You will have occasional bloating and cramping that should eventually fade away.  Until you are eating solid food normally, off all pain medications, and back to regular activities; your bowels will not be normal.   Avoiding constipation The goal: ONE SOFT BOWEL MOVEMENT A DAY!    Drink plenty of fluids.  Choose water first. TAKE A FIBER SUPPLEMENT EVERY DAY THE REST OF YOUR LIFE During your first week back home, gradually add back a fiber supplement every day Experiment which form you can tolerate.   There are many forms such as powders, tablets, wafers, gummies, etc Psyllium bran (Metamucil), methylcellulose (Citrucel), Miralax or Glycolax, Benefiber, Flax Seed.  Adjust the dose week-by-week (1/2 dose/day to 6 doses a day) until you are moving your bowels 1-2 times a day.  Cut back the dose or try a different fiber product if it is giving you problems such as diarrhea or bloating. Sometimes a laxative is needed to help jump-start bowels if constipated until the fiber supplement can help regulate your bowels.  If you are tolerating eating & you are farting, it is okay to try a gentle  laxative such as double dose MiraLax, prune juice, or Milk of Magnesia.  Avoid using laxatives too often. Stool softeners can sometimes help counteract the constipating effects of narcotic pain medicines.  It can also cause diarrhea, so avoid using for too long. If you are still constipated despite taking fiber daily, eating solids, and a few doses of laxatives, call our office. Controlling diarrhea Try drinking liquids and eating bland foods for a few days to avoid stressing your intestines further. Avoid dairy products (especially milk & ice cream) for a short time.  The intestines often can lose the ability to digest lactose when stressed. Avoid foods that cause gassiness or bloating.  Typical foods include beans and other legumes, cabbage, broccoli, and dairy foods.  Avoid greasy, spicy, fast foods.  Every person has some sensitivity to other foods, so listen to your body and avoid those foods that trigger problems for you. Probiotics (such as active yogurt, Align, etc) may help repopulate the intestines and colon with normal bacteria and calm down a sensitive digestive tract Adding a fiber supplement gradually can help thicken stools by absorbing excess fluid and retrain the intestines to act more normally.  Slowly increase the dose over a few weeks.  Too much fiber too soon can backfire and cause cramping & bloating. It is okay to try and slow down diarrhea with a few doses of antidiarrheal medicines.   Bismuth subsalicylate (ex. Kayopectate, Pepto Bismol) for a few doses can help control diarrhea.  Avoid if pregnant.   Loperamide (Imodium) can slow down diarrhea.  Start with one tablet (2mg) first.  Avoid if you are having fevers or severe pain.  ILEOSTOMY PATIENTS WILL HAVE CHRONIC DIARRHEA since their colon is not in use.    Drink plenty of liquids.  You will need to drink even more glasses of water/liquid a day to avoid getting dehydrated. Record output from your ileostomy.  Expect to empty  the bag every 3-4 hours at first.  Most people with a permanent ileostomy empty their bag 4-6 times at the least.   Use antidiarrheal medicine (especially Imodium) several times a day to avoid getting dehydrated.  Start with a dose at bedtime & breakfast.  Adjust up or   down as needed.  Increase antidiarrheal medications as directed to avoid emptying the bag more than 8 times a day (every 3 hours). Work with your wound ostomy nurse to learn care for your ostomy.  See ostomy care instructions. TROUBLESHOOTING IRREGULAR BOWELS 1) Start with a soft & bland diet. No spicy, greasy, or fried foods.  2) Avoid gluten/wheat or dairy products from diet to see if symptoms improve. 3) Miralax 17gm or flax seed mixed in 8oz. water or juice-daily. May use 2-4 times a day as needed. 4) Gas-X, Phazyme, etc. as needed for gas & bloating.  5) Prilosec (omeprazole) over-the-counter as needed 6)  Consider probiotics (Align, Activa, etc) to help calm the bowels down  Call your doctor if you are getting worse or not getting better.  Sometimes further testing (cultures, endoscopy, X-ray studies, CT scans, bloodwork, etc.) may be needed to help diagnose and treat the cause of the diarrhea. [facility] [address], [address], [place], [place]  [address] [phone] - Main.    [phone]  - Toll Free.   [phone] - Fax [email or web address]   ###############################   #######################################################  Ostomy Support Information  You've heard that people get along just fine with only one of their eyes, or one of their lungs, or one of their kidneys. But you also know that you have only one intestine and only one bladder, and that leaves you feeling awfully empty, both physically and emotionally: You think no other people go around without part of their intestine with the ends of their intestines sticking out through their abdominal walls.    YOU ARE NOT ALONE.  There are nearly three quarters of a million people in the [place] who have an ostomy; people who have had surgery to remove all or part of their colons or bladders.   There is even a national association, the [employer] with over 350 local affiliated support groups that are organized by volunteers who provide peer support and counseling. [employer] has a toll free telephone num-ber, 800-826-0826 and an educational, interactive website, www.ostomy.org   An ostomy is an opening in the belly (abdominal wall) made by surgery. Ostomates are people who have had this procedure. The opening (stoma) allows the kidney or bowel to grdischarge waste. An external pouch covers the stoma to collect waste. Pouches are are a simple bag and are odor free. Different companies have disposable or reusable pouches to fit one's lifestyle. An ostomy can either be temporary or permanent.   THERE ARE THREE MAIN TYPES OF OSTOMIES Colostomy. A colostomy is a surgically created opening in the large intestine (colon). Ileostomy. An ileostomy is a surgically created opening in the small intestine. Urostomy. A urostomy is a surgically created opening to divert urine away from the bladder.  OSTOMY Care  The following guidelines will make care of your colostomy easier. Keep this information close by for quick reference.  Helpful DIET hints Eat a well-balanced diet including vegetables and fresh fruits. Eat on a regular schedule.  Drink at least 6 to 8 glasses of fluids daily. Eat slowly in a relaxed atmosphere. Chew your food thoroughly. Avoid chewing gum, smoking, and drinking from a straw. This will help decrease the amount of air you swallow, which may help reduce gas. Eating yogurt or drinking buttermilk may help reduce gas.  To control gas at night, do not eat after 8 p.m. This will give your bowel time to quiet down before you go   to bed.  If gas is a problem, you can purchase  Beano. Sprinkle Beano on the first bite of food before eating to reduce gas. It has no flavor and should not change the taste of your food. You can buy Beano over the counter at your local drugstore.  Foods like fish, onions, garlic, broccoli, asparagus, and cabbage produce odor. Although your pouch is odor-proof, if you eat these foods you may notice a stronger odor when emptying your pouch. If this is a concern, you may want to limit these foods in your diet.  If you have an ileostomy, you will have chronic diarrhea & need to drink more liquids to avoid getting dehydrated.  Consider antidiarrheal medicine like imodium (loperamide) or Lomotil to help slow down bowel movements / diarrhea into your ileostomy bag.  GETTING TO GOOD BOWEL HEALTH WITH AN ILEOSTOMY    With the colon bypassed & not in use, you will have small bowel diarrhea.   It is important to thicken & slow your bowel movements down.   The goal: 4-6 small BOWEL MOVEMENTS A DAY It is important to drink plenty of liquids to avoid getting dehydrated  CONTROLLING ILEOSTOMY DIARRHEA  TAKE A FIBER SUPPLEMENT (FiberCon or Benefiner soluble fiber) twice a day - to thicken stools by absorbing excess fluid and retrain the intestines to act more normally.  Slowly increase the dose over a few weeks.  Too much fiber too soon can backfire and cause cramping & bloating.  TAKE AN IRON SUPPLEMENT twice a day to naturally constipate your bowels.  Usually ferrous sulfate 325mg twice a day)  TAKE ANTI-DIARRHEAL MEDICINES: Loperamide (Imodium) can slow down diarrhea.  Start with two tablets (= 4mg) first and then try one tablet every 6 hours.  Can go up to 2 pills four times day (8 pills of 2mg max) Avoid if you are having fevers or severe pain.  If you are not better or start feeling worse, stop all medicines and call your doctor for advice LoMotil (Diphenoxylate / Atropine) is another medicine that can constipate & slow down bowel moevements Pepto  Bismol (bismuth) can gently thicken bowels as well  If diarrhea is worse,: drink plenty of liquids and try simpler foods for a few days to avoid stressing your intestines further. Avoid dairy products (especially milk & ice cream) for a short time.  The intestines often can lose the ability to digest lactose when stressed. Avoid foods that cause gassiness or bloating.  Typical foods include beans and other legumes, cabbage, broccoli, and dairy foods.  Every person has some sensitivity to other foods, so listen to our body and avoid those foods that trigger problems for you.Call your doctor if you are getting worse or not better.  Sometimes further testing (cultures, endoscopy, X-ray studies, bloodwork, etc) may be needed to help diagnose and treat the cause of the diarrhea. Take extra anti-diarrheal medicines (maximum is 8 pills of 2mg loperamide a day)   Tips for POUCHING an OSTOMY   Changing Your Pouch The best time to change your pouch is in the morning, before eating or drinking anything. Your stoma can function at any time, but it will function more after eating or drinking.   Applying the pouching system  Place all your equipment close at hand before removing your pouch.  Wash your hands.  Stand or sit in front of a mirror. Use the position that works best for you. Remember that you must keep the skin around the stoma   wrinkle-free for a good seal.  Gently remove the used pouch (1-piece system) or the pouch and old wafer (2-piece system). Empty the pouch into the toilet. Save the closure clip to use again.  Wash the stoma itself and the skin around the stoma. Your stoma may bleed a little when being washed. This is normal. Rinse and pat dry. You may use a wash cloth or soft paper towels (like Bounty), mild soap (like Dial, Safeguard, or Ivory), and water. Avoid soaps that contain perfumes or lotions.  For a new pouch (1-piece system) or a new wafer (2-piece system), measure your  stoma using the stoma guide in each box of supplies.  Trace the shape of your stoma onto the back of the new pouch or the back of the new wafer. Cut out the opening. Remove the paper backing and set it aside.  Optional: Apply a skin barrier powder to surrounding skin if it is irritated (bare or weeping), and dust off the excess. Optional: Apply a skin-prep wipe (such as Skin Prep or All-Kare) to the skin around the stoma, and let it dry. Do not apply this solution if the skin is irritated (red, tender, or broken) or if you have shaved around the stoma. Optional: Apply a skin barrier paste (such as Stomahesive, Coloplast, or Premium) around the opening cut in the back of the pouch or wafer. Allow it to dry for 30 to 60 seconds.  Hold the pouch (1-piece system) or wafer (2-piece system) with the sticky side toward your body. Make sure the skin around the stoma is wrinkle-free. Center the opening on the stoma, then press firmly to your abdomen (Fig. 4). Look in the mirror to check if you are placing the pouch, or wafer, in the right position. For a 2-piece system, snap the pouch onto the wafer. Make sure it snaps into place securely.  Place your hand over the stoma and the pouch or wafer for about 30 seconds. The heat from your hand can help the pouch or wafer stick to your skin.  Add deodorant (such as Super Banish or Nullo) to your pouch. Other options include food extracts such as vanilla oil and peppermint extract. Add about 10 drops of the deodorant to the pouch. Then apply the closure clamp. Note: Do not use toxic  chemicals or commercial cleaning agents in your pouch. These substances may harm the stoma.  Optional: For extra seal, apply tape to all 4 sides around the pouch or wafer, as if you were framing a picture. You may use any brand of medical adhesive tape. Change your pouch every 5 to 7 days. Change it immediately if a leak occurs.  Wash your hands afterwards.  If you are wearing a  2-piece system, you may use 2 new pouches per week and alternate them. Rinse the pouch with mild soap and warm water and hang it to dry for the next day. Apply the fresh pouch. Alternate the 2 pouches like this for a week. After a week, change the wafer and begin with 2 new pouches. Place the old pouches in a plastic bag, and put them in the trash.   LIVING WITH AN OSTOMY  Emptying Your Pouch Empty your pouch when it is one-third full (of urine, stool, and/or gas). If you wait until your pouch is fuller than this, it will be more difficult to empty and more noticeable. When you empty your pouch, either put toilet paper in the toilet bowl first, or flush the   toilet while you empty the pouch. This will reduce splashing. You can empty the pouch between your legs or to one side while sitting, or while standing or stooping. If you have a 2-piece system, you can snap off the pouch to empty it. Remember that your stoma may function during this time. If you wish to rinse your pouch after you empty it, a turkey baster can be helpful. When using a baster, squirt water up into the pouch through the opening at the bottom. With a 2-piece system, you can snap off the pouch to rinse it. After rinsing  your pouch, empty it into the toilet. When rinsing your pouch at home, put a few granules of Dreft soap in the rinse water. This helps lubricate and freshen your pouch. The inside of your pouch can be sprayed with non-stick cooking oil (Pam spray). This may help reduce stool sticking to the inside of the pouch.  Bathing You may shower or bathe with your pouch on or off. Remember that your stoma may function during this time.  The materials you use to wash your stoma and the skin around it should be clean, but they do not need to be sterile.  Wearing Your Pouch During hot weather, or if you perspire a lot in general, wear a cover over your pouch. This may prevent a rash on your skin under the pouch. Pouch covers are  sold at ostomy supply stores. Wear the pouch inside your underwear for better support. Watch your weight. Any gain or loss of 10 to 15 pounds or more can change the way your pouch fits.  Going Away From Home A collapsible cup (like those that come in travel kits) or a soft plastic squirt bottle with a pull-up top (like a travel bottle for shampoo) can be used for rinsing your pouch when you are away from home. Tilt the opening of the pouch at an upward angle when using a cup to rinse.  Carry wet wipes or extra tissues to use in public bathrooms.  Carry an extra pouching system with you at all times.  Never keep ostomy supplies in the glove compartment of your car. Extreme heat or cold can damage the skin barriers and adhesive wafers on the pouch.  When you travel, carry your ostomy supplies with you at all times. Keep them within easy reach. Do not pack ostomy supplies in baggage that will be checked or otherwise separated from you, because your baggage might be lost. If you're traveling out of the country, it is helpful to have a letter stating that you are carrying ostomy supplies as a medical necessity.  If you need ostomy supplies while traveling, look in the yellow pages of the telephone book under "Surgical Supplies." Or call the local ostomy organization to find out where supplies are available.  Do not let your ostomy supplies get low. Always order new pouches before you use the last one.  Reducing Odor Limit foods such as broccoli, cabbage, onions, fish, and garlic in your diet to help reduce odor. Each time you empty your pouch, carefully clean the opening of the pouch, both inside and outside, with toilet paper. Rinse your pouch 1 or 2 times daily after you empty it (see directions for emptying your pouch and going away from home). Add deodorant (such as Super Banish or Nullo) to your pouch. Use air deodorizers in your bathroom. Do not add aspirin to your pouch. Even though  aspirin can help prevent odor, it   could cause ulcers on your stoma.  When to call the doctor Call the doctor if you have any of the following symptoms: Purple, black, or white stoma Severe cramps lasting more than 6 hours Severe watery discharge from the stoma lasting more than 6 hours No output from the colostomy for 3 days Excessive bleeding from your stoma Swelling of your stoma to more than 1/2-inch larger than usual Pulling inward of your stoma below skin level Severe skin irritation or deep ulcers Bulging or other changes in your abdomen  When to call your ostomy nurse Call your ostomy/enterostomal therapy (WOCN) nurse if any of the following occurs: Frequent leaking of your pouching system Change in size or appearance of your stoma, causing discomfort or problems with your pouch Skin rash or rawness Weight gain or loss that causes problems with your pouch     FREQUENTLY ASKED QUESTIONS   Why haven't you met any of these folks who have an ostomy?  Well, maybe you have! You just did not recognize them because an ostomy doesn't show. It can be kept secret if you wish. Why, maybe some of your best friends, office associates or neighbors have an ostomy ... you never can tell. People facing ostomy surgery have many quality-of-life questions like: Will you bulge? Smell? Make noises? Will you feel waste leaving your body? Will you be a captive of the toilet? Will you starve? Be a social outcast? Get/stay married? Have babies? Easily bathe, go swimming, bend over?  OK, let's look at what you can expect:   Will you bulge?  Remember, without part of the intestine or bladder, and its contents, you should have a flatter tummy than before. You can expect to wear, with little exception, what you wore before surgery ... and this in-cludes tight clothing and bathing suits.   Will you smell?  Today, thanks to modern odor proof pouching systems, you can walk into an ostomy support group  meeting and not smell anything that is foul or offensive. And, for those with an ileostomy or colostomy who are concerned about odor when emptying their pouch, there are in-pouch deodorants that can be used to eliminate any waste odors that may exist.   Will you make noises?  Everyone produces gas, especially if they are an air-swallower. But intestinal sounds that occur from time to time are no differ-ent than a gurgling tummy, and quite often your clothing will muffle any sounds.   Will you feel the waste discharges?  For those with a colostomy or ileostomy there might be a slight pressure when waste leaves your body, but understand that the intestines have no nerve endings, so there will be no unpleasant sensations. Those with a urostomy will probably be unaware of any kidney drainage.   Will you be a captive of the toilet?  Immediately post-op you will spend more time in the bathroom than you will after your body recovers from surgery. Every person is different, but on average those with an ileostomy or urostomy may empty their pouches 4 to 6 times a day; a little  less if you have a colostomy. The average wear time between pouch system changes is 3 to 5 days and the changing process should take less than 30 minutes.   Will I need to be on a special diet? Most people return to their normal diet when they have recovered from surgery. Be sure to chew your food well, eat a well-balanced diet and drink plenty of fluids. If   you experience problems with a certain food, wait a couple of weeks and try it again.  Will there be odor and noises? Pouching systems are designed to be odor-proof or odor-resistant. There are deodorants that can be used in the pouch. Medications are also available to help reduce odor. Limit gas-producing foods and carbonated beverages. You will experience less gas and fewer noises as you heal from surgery.  How much time will it take to care for my ostomy? At first, you may  spend a lot of time learning about your ostomy and how to take care of it. As you become more comfortable and skilled at changing the pouching system, it will take very little time to care for it.   Will I be able to return to work? People with ostomies can perform most jobs. As soon as you have healed from surgery, you should be able to return to work. Heavy lifting (more than 10 pounds) may be discouraged.   What about intimacy? Sexual relationships and intimacy are important and fulfilling aspects of your life. They should continue after ostomy surgery. Intimacy-related concerns should be discussed openly between you and your partner.   Can I wear regular clothing? You do not need to wear special clothing. Ostomy pouches are fairly flat and barely noticeable. Elastic undergarments will not hurt the stoma or prevent the ostomy from functioning.   Can I participate in sports? An ostomy should not limit your involvement in sports. Many people with ostomies are runners, skiers, swimmers or participate in other active lifestyles. Talk with your caregiver first before doing heavy physical activity.  Will you starve?  Not if you follow doctor's orders at each stage of your post-op adjustment. There is no such thing as an "ostomy diet". Some people with an ostomy will be able to eat and tolerate anything; others may find diffi-culty with some foods. Each person is an individual and must determine, by trial, what is best for them. A good practice for all is to drink plenty of water.   Will you be a social outcast?  Have you met anyone who has an ostomy and is a social outcast? Why should you be the first? Only your attitude and self image will effect how you are treated. No confi-dent person is an outcast.    PROFESSIONAL HELP   Resources are available if you need help or have questions about your ostomy.   Specially trained nurses called Wound, Ostomy Continence Nurses (WOCN) are available for  consultation in most major medical centers.  Consider getting an ostomy consult at an outpatient ostomy clinic.   [place] has an Ostomy Clinic run by an WOCN ostomy nurse at the [facility].  [phone]. [facility] can help set up an appointment   The United Ostomy Association (UOA) is a group made up of many local chapters throughout the [place]. These local groups hold meetings and provide support to prospective and existing ostomates. They sponsor educational events and have qualified visitors to make personal or telephone visits. Contact the UOA for the chapter nearest you and for other educational publications.  More detailed information can be found in Colostomy Guide, a publication of the United Ostomy Association (UOA). Contact UOA at 1-800-826-0826 or visit their web site at www.uoaa.org. The website contains links to other sites, suppliers and resources.  [facility] Secure Start Services: Start at the website to enlist for support.  Your Wound Ostomy (WOCN) nurse may have started this   process. https://[email or web address].[employer].com/en/securestart Secure Start services are designed to support people as they live their lives with an ostomy or neurogenic bladder. Enrolling is easy and at no cost to the patient. We realize that each person's needs and life journey are different. Through Secure Start services, we want to help people live their life, their way.  #######################################################  

## 2022-02-01 ENCOUNTER — Other Ambulatory Visit: Payer: Self-pay

## 2022-02-04 DIAGNOSIS — N3289 Other specified disorders of bladder: Secondary | ICD-10-CM | POA: Diagnosis not present

## 2022-02-04 DIAGNOSIS — N400 Enlarged prostate without lower urinary tract symptoms: Secondary | ICD-10-CM | POA: Diagnosis not present

## 2022-02-04 DIAGNOSIS — C180 Malignant neoplasm of cecum: Secondary | ICD-10-CM | POA: Diagnosis not present

## 2022-02-04 DIAGNOSIS — R5383 Other fatigue: Secondary | ICD-10-CM | POA: Diagnosis not present

## 2022-02-04 LAB — SURGICAL PATHOLOGY

## 2022-06-29 DIAGNOSIS — R030 Elevated blood-pressure reading, without diagnosis of hypertension: Secondary | ICD-10-CM | POA: Diagnosis not present

## 2022-06-29 DIAGNOSIS — I2510 Atherosclerotic heart disease of native coronary artery without angina pectoris: Secondary | ICD-10-CM | POA: Diagnosis not present

## 2022-06-29 DIAGNOSIS — C180 Malignant neoplasm of cecum: Secondary | ICD-10-CM | POA: Diagnosis not present

## 2022-06-29 DIAGNOSIS — Z0000 Encounter for general adult medical examination without abnormal findings: Secondary | ICD-10-CM | POA: Diagnosis not present

## 2022-06-29 DIAGNOSIS — E7800 Pure hypercholesterolemia, unspecified: Secondary | ICD-10-CM | POA: Diagnosis not present

## 2022-06-29 DIAGNOSIS — Z5181 Encounter for therapeutic drug level monitoring: Secondary | ICD-10-CM | POA: Diagnosis not present

## 2022-06-29 DIAGNOSIS — N400 Enlarged prostate without lower urinary tract symptoms: Secondary | ICD-10-CM | POA: Diagnosis not present

## 2022-08-16 DIAGNOSIS — Z23 Encounter for immunization: Secondary | ICD-10-CM | POA: Diagnosis not present

## 2022-08-26 DIAGNOSIS — Z23 Encounter for immunization: Secondary | ICD-10-CM | POA: Diagnosis not present

## 2022-12-20 DIAGNOSIS — R030 Elevated blood-pressure reading, without diagnosis of hypertension: Secondary | ICD-10-CM | POA: Diagnosis not present

## 2022-12-20 DIAGNOSIS — I2510 Atherosclerotic heart disease of native coronary artery without angina pectoris: Secondary | ICD-10-CM | POA: Diagnosis not present

## 2022-12-21 NOTE — Progress Notes (Unsigned)
Cardiology Office Note   Date:  12/23/2022   ID:  Darin Simmons, Darin Simmons [birth year], MRN [id number]  PCP:  London Pepper, MD    No chief complaint on file.  CAD  Wt Readings from Last 3 Encounters:  12/23/22 140 lb 9.6 oz (63.8 kg)  01/30/22 128 lb 4.9 oz (58.2 kg)  01/19/22 137 lb 9.6 oz (62.4 kg)       History of Present Illness: Darin Simmons is a 82 y.o. male  with a hx of aortic atherosclerosis, CAD s/p DES to RCA, aortic stenosis s/p AVR, hypothyroidism, mixed hyperlipidemia, and chronic HFpEF.   He had DES to RCA CTO in 2008. He had normal LV function at the time. Echo April 2018 which showed a trileaflet aortic valve with severely calcified leaflets and restricted mobility with severe stenosis. He underwent AVR June 2018 with bioprosthetic valve. 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 Post op was complicated by a sternal wound infection. He was seen in consult by EP during hospitalization for asymptomatic sinus node dysfunction with occasional daytime HRs in the 40s. Had brief NSVT on telemetry for which he was asymptomatic. LV function normal by echo. AV nodal and sinus nodal blocking agents avoided.    In September 2018, he was seen by Dr. Caryl Comes for lightheadedness. He wore a monitor in November 2018 which revealed symptoms of lightheadedness associated with PVC and atrial ectopy, no tachycardia, no pauses. He has maintained consistent follow-up.    In 2023: "He has strong opinions about medications and about the [place] health care system. He takes no prescriptions. Has history of hypothyroid which he has treated with yoga, food, and herbs. Feels his throat get sore when his thyroid is malfunctioning." Had colectomy for colon cancer in 2023.  REcovered well.  Was using beet root for BP in 8/23 but then BP has started increasing in late 2023 and early 2024.  He saw Dr. Orland Mustard.  He improved his diet. BPs  were in the 130-140 range.  Denies : exertional Chest pain. Dizziness. Leg edema. Nitroglycerin use. Orthopnea. Palpitations. Paroxysmal nocturnal dyspnea. Syncope.    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[place])    Umbilical hernia 93/81/0175   Wound infection 04/2017    Past Surgical History:  Procedure Laterality Date   AORTIC VALVE REPLACEMENT N/A 04/22/2017   Procedure: AORTIC VALVE REPLACEMENT (AVR) using Magna Ease size 72m;  Surgeon: BGaye Pollack MD;  [place] [facility]  Service: Open Heart Surgery;  Laterality: N/A;   APPLICATION OF WOUND VAC N/A 05/13/2017   Procedure: WOUND VAC CHANGE;  Surgeon: HMelrose Nakayama MD;  [place] [facility]  Service: Thoracic;  Laterality: N/A;   DENTAL SURGERY     removed   hair follicle removed Left  heart stent     HERNIA REPAIR Bilateral 12/21/84   BIH and umbilical hernia repair   INGUINAL HERNIA REPAIR Bilateral 01/18/2013   Procedure: LAPAROSCOPIC BILATERAL INGUINAL HERNIA REPAIR;  Surgeon: Odis Hollingshead, MD;  Location: [facility];  Service: General;  Laterality: Bilateral;   INSERTION OF MESH Bilateral 01/18/2013   Procedure: INSERTION OF MESH;  Surgeon: Odis Hollingshead, MD;  Location: [facility];  Service: General;  Laterality: Bilateral;  AND UMBILICUS   STERNAL WOUND DEBRIDEMENT N/A 05/11/2017   Procedure: STERNAL WOUND IRRIGATION AND DEBRIDEMENT;  Surgeon: Melrose Nakayama, MD;  Location: [place];   Service: Thoracic;  Laterality: N/A;   TEE WITHOUT CARDIOVERSION N/A 04/22/2017   Procedure: TRANSESOPHAGEAL ECHOCARDIOGRAM (TEE);  Surgeon: Gaye Pollack, MD;  Location: [place];  Service: Open Heart Surgery;  Laterality: N/A;   UMBILICAL HERNIA REPAIR N/A 01/18/2013   Procedure: HERNIA REPAIR UMBILICAL ADULT;  Surgeon: Odis Hollingshead, MD;  Location: [facility];  Service: General;  Laterality: N/A;     Current Outpatient Medications  Medication Sig Dispense Refill   Acetylcysteine (NAC 600 PO) Take 600 mg by mouth daily.     Alpha-Lipoic Acid 200 MG TABS Take 200-400 mg by mouth daily.     arginine 500 MG tablet Take 500 mg by mouth daily.     Ascorbic Acid (VITAMIN C) 1000 MG tablet Take 1,000 mg by mouth in the morning and at bedtime.     b complex vitamins capsule Take 1 capsule by mouth daily.     Bilberry 150 MG CAPS Take 150 mg by mouth daily.     Cholecalciferol (VITAMIN D) 50 MCG (2000 UT) CAPS Take 2,000 Units by mouth in the morning and at bedtime.     Coenzyme Q10 (CO Q-10) 100 MG CAPS Take 100 mg by mouth in the morning and at bedtime.     Ginkgo Biloba 60 MG TABS Take 60 mg by mouth in the morning and at bedtime.     Hawthorn 565 MG CAPS Take 565 mg by mouth in the morning and at bedtime.     Lutein 20 MG CAPS Take 20 mg by mouth daily.     Lysine 1000 MG TABS Take 1,000 mg by mouth 2 (two) times a week.     Magnesium 250 MG TABS Take 250 mg by mouth in the morning and at bedtime.     milk thistle 175 MG tablet Take 175 mg by mouth in the morning and at bedtime.     Misc Natural Products (PROSTATE CONTROL) CAPS Take 1 capsule by mouth daily.     NITROSTAT 0.4 MG SL tablet 1 tablet Sublingual every 5 minutes as needed for chest pain. DO NOT EXCEED A TOTAL OF 3 DOSES IN 15 MINUTES     OVER THE COUNTER MEDICATION Take 3 capsules by mouth daily. Rejuvenzyme - Systemic Enzyme     Potassium 99 MG TABS Take 99 mg by mouth daily.     QUERCETIN PO Take 475 mg by mouth daily.     Saw  Palmetto 450 MG CAPS Take 450 mg by mouth daily.     Selenium 200 MCG CAPS Take 200 mcg by mouth daily.     sildenafil (VIAGRA) 50 MG tablet take 1 tablet by mouth as needed 24mns to 4 hours prior to sexual activity once a day (do not take with nitroglycerin)     Turmeric 500 MG CAPS 1 capsule Orally once a day if needed  vitamin B-12 (CYANOCOBALAMIN) 1000 MCG tablet Take 1,000 mcg by mouth daily.     zinc sulfate 220 (50 Zn) MG capsule Take 220 mg by mouth daily.     No current facility-administered medications for this visit.    Allergies:   Bee venom, Crestor [rosuvastatin], Diphenhydramine hcl, Statins, Diphenhydramine hcl, and Other    Social History:  The patient  reports that he quit smoking about 14 years ago. His smoking use included cigarettes. He has a 50.00 pack-year smoking history. He has never used smokeless tobacco. He reports that he does not drink alcohol and does not use drugs.   Family History:  The patient's family history includes Heart disease in his father.    ROS:  Please see the history of present illness.   Otherwise, review of systems are positive for intentional weight loss.   All other systems are reviewed and negative.    PHYSICAL EXAM: VS:  BP (!) 142/70   Pulse (!) 59   Ht '5\' 10"'$  (1.778 m)   Wt 140 lb 9.6 oz (63.8 kg)   SpO2 98%   BMI 20.17 kg/m  , BMI Body mass index is 20.17 kg/m. GEN: Well nourished, well developed, in no acute distress HEENT: normal Neck: no JVD, carotid bruits, or masses Cardiac: RRR; no murmurs, rubs, or gallops,no edema  Respiratory:  clear to auscultation bilaterally, normal work of breathing GI: soft, nontender, nondistended, + BS MS: no deformity or atrophy Skin: warm and dry, no rash Neuro:  Strength and sensation are intact Psych: euthymic mood, full affect   EKG:   The ekg ordered today demonstrates NSR, TWI inversions, similar to prior   Recent Labs: 01/29/2022: BUN 9; Creatinine, Ser 0.54; Hemoglobin  13.7; Platelets 106; Potassium 4.0; Sodium 136   Lipid Panel    Component Value Date/Time   CHOL 183 11/24/2020 0845   TRIG 150 (H) 11/24/2020 0845   HDL 44 11/24/2020 0845   CHOLHDL 4.2 11/24/2020 0845   CHOLHDL 2.4 08/27/2015 0901   VLDL 13 08/27/2015 0901   LDLCALC 112 (H) 11/24/2020 0845     Other studies Reviewed: Additional studies/ records that were reviewed today with results demonstrating: 8/23: LDL 162.   ASSESSMENT AND PLAN:  CAD: RCA CTO PCI done in 2008.  No angina.  Continue aggressive secondary prevention.   AS: s/p AVR in 2018. Check echo.   HFpEF: Appears euvolemic.  Eats very healthy.  Hyperlipidemia: He has declined statins due to perceived side effects- muscle aches. Refer to pharm Lipid clinic to see if he is a candidate for more advanced lipid lowering therapy.  LDL target 70.  HTN: borderline   Current medicines are reviewed at length with the patient today.  The patient concerns regarding his medicines were addressed.  The following changes have been made:  No change  Labs/ tests ordered today include:  No orders of the defined types were placed in this encounter.   Recommend 150 minutes/week of aerobic exercise Low fat, low carb, high fiber diet recommended  Disposition:   FU in 1 year   Signed, Larae Grooms, MD  12/23/2022 8:59 AM    [facility] [address], [place], [place]  [address] Phone: [phone]; Fax: [phone]

## 2022-12-23 ENCOUNTER — Ambulatory Visit: Payer: Medicare Other | Attending: Interventional Cardiology | Admitting: Interventional Cardiology

## 2022-12-23 ENCOUNTER — Encounter: Payer: Self-pay | Admitting: Interventional Cardiology

## 2022-12-23 VITALS — BP 142/70 | HR 59 | Ht 70.0 in | Wt 140.6 lb

## 2022-12-23 DIAGNOSIS — I359 Nonrheumatic aortic valve disorder, unspecified: Secondary | ICD-10-CM

## 2022-12-23 DIAGNOSIS — I5032 Chronic diastolic (congestive) heart failure: Secondary | ICD-10-CM

## 2022-12-23 DIAGNOSIS — I517 Cardiomegaly: Secondary | ICD-10-CM

## 2022-12-23 DIAGNOSIS — Z952 Presence of prosthetic heart valve: Secondary | ICD-10-CM | POA: Diagnosis not present

## 2022-12-23 DIAGNOSIS — I10 Essential (primary) hypertension: Secondary | ICD-10-CM

## 2022-12-23 DIAGNOSIS — I2510 Atherosclerotic heart disease of native coronary artery without angina pectoris: Secondary | ICD-10-CM | POA: Diagnosis not present

## 2022-12-23 DIAGNOSIS — E785 Hyperlipidemia, unspecified: Secondary | ICD-10-CM | POA: Diagnosis not present

## 2022-12-23 NOTE — Patient Instructions (Signed)
Medication Instructions:  Your physician recommends that you continue on your current medications as directed. Please refer to the Current Medication list given to you today.  *If you need a refill on your cardiac medications before your next appointment, please call your pharmacy*   Lab Work: none If you have labs (blood work) drawn today and your tests are completely normal, you will receive your results only by: [place] (if you have MyChart) OR A paper copy in the mail If you have any lab test that is abnormal or we need to change your treatment, we will call you to review the results.   Testing/Procedures: Your physician has requested that you have an echocardiogram. Echocardiography is a painless test that uses sound waves to create images of your heart. It provides your doctor with information about the size and shape of your heart and how well your heart's chambers and valves are working. This procedure takes approximately one hour. There are no restrictions for this procedure. Please do NOT wear cologne, perfume, aftershave, or lotions (deodorant is allowed). Please arrive 15 minutes prior to your appointment time.    Follow-Up: At [facility], you and your health needs are our priority.  As part of our continuing mission to provide you with exceptional heart care, we have created designated Provider Care Teams.  These Care Teams include your primary Cardiologist (physician) and Advanced Practice Providers (APPs -  Physician Assistants and Nurse Practitioners) who all work together to provide you with the care you need, when you need it.  We recommend signing up for the patient portal called "MyChart".  Sign up information is provided on this After Visit Summary.  MyChart is used to connect with patients for Virtual Visits (Telemedicine).  Patients are able to view lab/test results, encounter notes, upcoming appointments, etc.  Non-urgent messages can be sent to your  provider as well.   To learn more about what you can do with MyChart, go to [email or web address].    Your next appointment:   12 month(s)  Provider:   Larae Grooms, MD     Other Instructions You have been referred to see the pharmacist in the lipid clinic in our office. Please schedule patient for new patient appointment

## 2023-01-18 ENCOUNTER — Ambulatory Visit (HOSPITAL_COMMUNITY): Payer: Medicare Other | Attending: Internal Medicine

## 2023-01-18 DIAGNOSIS — I5032 Chronic diastolic (congestive) heart failure: Secondary | ICD-10-CM

## 2023-01-18 DIAGNOSIS — I517 Cardiomegaly: Secondary | ICD-10-CM | POA: Diagnosis not present

## 2023-01-18 DIAGNOSIS — Z952 Presence of prosthetic heart valve: Secondary | ICD-10-CM | POA: Diagnosis not present

## 2023-01-18 DIAGNOSIS — I359 Nonrheumatic aortic valve disorder, unspecified: Secondary | ICD-10-CM

## 2023-01-18 LAB — ECHOCARDIOGRAM COMPLETE
AR max vel: 2.59 cm2
AV Area VTI: 2.53 cm2
AV Area mean vel: 2.39 cm2
AV Mean grad: 9.5 mmHg
AV Peak grad: 17.4 mmHg
Ao pk vel: 2.09 m/s
Area-P 1/2: 3.27 cm2
S' Lateral: 3.55 cm

## 2023-02-02 ENCOUNTER — Ambulatory Visit: Payer: Medicare Other | Attending: Cardiovascular Disease | Admitting: Student

## 2023-02-02 ENCOUNTER — Encounter: Payer: Self-pay | Admitting: Student

## 2023-02-02 DIAGNOSIS — E782 Mixed hyperlipidemia: Secondary | ICD-10-CM

## 2023-02-02 DIAGNOSIS — E785 Hyperlipidemia, unspecified: Secondary | ICD-10-CM | POA: Diagnosis not present

## 2023-02-02 LAB — HEPATIC FUNCTION PANEL
ALT: 14 IU/L (ref 0–44)
AST: 24 IU/L (ref 0–40)
Albumin: 4.5 g/dL (ref 3.8–4.8)
Alkaline Phosphatase: 104 IU/L (ref 44–121)
Bilirubin Total: 0.5 mg/dL (ref 0.0–1.2)
Bilirubin, Direct: 0.13 mg/dL (ref 0.00–0.40)
Total Protein: 6.6 g/dL (ref 6.0–8.5)

## 2023-02-02 LAB — LIPID PANEL
Chol/HDL Ratio: 4.8 ratio (ref 0.0–5.0)
Cholesterol, Total: 218 mg/dL — ABNORMAL HIGH (ref 100–199)
HDL: 45 mg/dL (ref >39)
LDL Chol Calc (NIH): 139 mg/dL — ABNORMAL HIGH (ref 0–99)
Triglycerides: 189 mg/dL — ABNORMAL HIGH (ref 0–149)
VLDL Cholesterol Cal: 34 mg/dL (ref 5–40)

## 2023-02-02 NOTE — Assessment & Plan Note (Addendum)
Assessment:  LDL goal: <70  mg/dl last LDLc 162 mg/dl (06/30/2023) Intolerance to various statins various doses and Zetia- muscle pain  Discussed next potential options (PCSK-9 inhibitors, bempedoic acid and inclisiran); cost, dosing efficacy, side effects  Patient prefers inclisiran over self injectable mediations also patient wants to get new baseline lipid and LFT before he start new therapy   Plan: Will assess coverage and PA need for Leqvio  Will provide updates via phone

## 2023-02-02 NOTE — Patient Instructions (Signed)
Today we will get baseline lipid lab and liver function test. We will submit Leqvio start form to find out coverage for Inclisiran.

## 2023-02-02 NOTE — Progress Notes (Signed)
Patient ID: Darin Simmons                 DOB: May 12, 1941                    MRN: [id number]      HPI: CANDLER DENGER is a 82 y.o. male patient referred to lipid clinic by Dr.Varanasi. PMH is significant for aortic atherosclerosis, CAD s/p DES to RCA, aortic stenosis s/p AVR, hypothyroidism, mixed hyperlipidemia, and chronic HFpEF.  Patient presented today for lipid clinic. Reports he has made some major changes to his diet in past few months. He has been eating more plant based food. He has been very active around the house doing yard work and [place] work. Does 15 min yoga starches every morning. In the past he has tried various statins - does not remember the name of doses, could not tolerate them well. They gave him muscle aches and cramps once he stopped them the pain would go away. Have occasional dizziness but denies SOB, chest pain or swelling. His BP started staying in 140/80 range but he is not on any BP medications. He is interested in getting updated lipid lab today and his mother had liver problems so wanted to get baseline LFT before he go on injectable lipid lowering treatment    Diet: brown rice ,fish, lots of greens and lots of fruits    Exercise: yoga 15 min every day, loves gardening, gets busy with wood works   Family History:  Father- heart problem Brother- triple bypass  Mother: lived till 28 years   Social History:  Smoking: former smoker Alcohol: none   Labs: last lab on 06/29/2022 LDLc: 162, HDLc: 53, TC 236, TG: 119 (from [place]) Lipid Panel     Component Value Date/Time   CHOL 183 11/24/2020 0845   TRIG 150 (H) 11/24/2020 0845   HDL 44 11/24/2020 0845   CHOLHDL 4.2 11/24/2020 0845   CHOLHDL 2.4 08/27/2015 0901   VLDL 13 08/27/2015 0901   LDLCALC 112 (H) 11/24/2020 0845   LABVLDL 27 11/24/2020 0845    Past Medical History:  Diagnosis Date   Amputation, thumb, traumatic    tip of thumb gone from saw   Anxiety    Aortic stenosis    Arthritis    Bilateral  inguinal hernia 03/28/2012   BPH (benign prostatic hyperplasia)    CAD (coronary artery disease) 2004   Cancer [facility])    Depression    Dyspnea    Heart attack ([place]) 2004   Heart murmur    Hypercholesterolemia    Hypothyroidism    Psoriasis 1980's   PVD (peripheral vascular disease) ([place])    S/P aortic valve replacement with bioprosthetic valve 04/22/2017   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     Alpha-Lipoic Acid 200 MG TABS Take 200-400 mg by mouth daily.     arginine 500 MG tablet Take 500 mg by mouth daily.     Ascorbic Acid (VITAMIN C) 1000 MG tablet Take 1,000 mg by mouth in the morning and at bedtime.     b complex vitamins capsule Take 1 capsule by mouth daily.     Bilberry 150 MG CAPS Take 150 mg by mouth daily.     Cholecalciferol (VITAMIN  D) 50 MCG (2000 UT) CAPS Take 2,000 Units by mouth in the morning and at bedtime.     Coenzyme Q10 (CO Q-10) 100 MG CAPS Take 100 mg by mouth in the morning and at bedtime.     Ginkgo Biloba 60 MG TABS Take 60 mg by mouth in the morning and at bedtime.     Hawthorn 565 MG CAPS Take 565 mg by mouth in the morning and at bedtime.     Lutein 20 MG CAPS Take 20 mg by mouth daily.     Lysine 1000 MG TABS Take 1,000 mg by mouth 2 (two) times a week.     Magnesium 250 MG TABS Take 250 mg by mouth in the morning and at bedtime.     milk thistle 175 MG tablet Take 175 mg by mouth in the morning and at bedtime.     Misc Natural Products (PROSTATE CONTROL) CAPS Take 1 capsule by mouth daily.     NITROSTAT 0.4 MG SL tablet 1 tablet Sublingual every 5 minutes as needed for chest pain. DO NOT EXCEED A TOTAL OF 3 DOSES IN 15 MINUTES     OVER THE COUNTER MEDICATION Take 3 capsules by mouth daily. Rejuvenzyme - Systemic Enzyme     Potassium 99 MG TABS Take 99  mg by mouth daily.     QUERCETIN PO Take 475 mg by mouth daily.     Saw Palmetto 450 MG CAPS Take 450 mg by mouth daily.     Selenium 200 MCG CAPS Take 200 mcg by mouth daily.     sildenafil (VIAGRA) 50 MG tablet take 1 tablet by mouth as needed 72mns to 4 hours prior to sexual activity once a day (do not take with nitroglycerin)     Turmeric 500 MG CAPS 1 capsule Orally once a day if needed     vitamin B-12 (CYANOCOBALAMIN) 1000 MCG tablet Take 1,000 mcg by mouth daily.     zinc sulfate 220 (50 Zn) MG capsule Take 220 mg by mouth daily.     No current facility-administered medications on file prior to visit.    Allergies  Allergen Reactions   Bee Venom Shortness Of Breath and Swelling   Crestor [Rosuvastatin] Other (See Comments)    MYALGIAS   Diphenhydramine Hcl Other (See Comments)    Shaky legs   Statins Other (See Comments) and Nausea And Vomiting    MYALGIAS  MUSCLE PAIN   Diphenhydramine Hcl Other (See Comments)    Other Reaction(s): RLS   Other Other (See Comments)     Problem  Mixed Hyperlipidemia   Current Medications: none  Intolerances:atorvastatin 20 mg, 40 mg and 80 mg, Zetia 10 mg, Pravastatin 20 mg, Rosuvastatin 20 mg -muscle aches  Risk Factors: aortic atherosclerosis, CAD s/p DES to RCA,  LDL goal: <70 mg/dl    Mixed hyperlipidemia Assessment:  LDL goal: <70  mg/dl last LDLc 162 mg/dl (06/30/2023) Intolerance to various statins various doses and Zetia- muscle pain  Discussed next potential options (PCSK-9 inhibitors, bempedoic acid and inclisiran); cost, dosing efficacy, side effects  Patient prefers inclisiran over self injectable mediations also patient wants to get new baseline lipid and LFT before he start new therapy   Plan: Will assess coverage and PA need for Leqvio  Will provide updates via phone     Thank you,  VCammy Copa Pharm.D [facility] [place]  [address]  Phone: [phone]; Fax: [phone]

## 2023-02-23 ENCOUNTER — Telehealth: Payer: Self-pay | Admitting: Pharmacist

## 2023-02-23 DIAGNOSIS — E785 Hyperlipidemia, unspecified: Secondary | ICD-10-CM

## 2023-02-23 NOTE — Telephone Encounter (Signed)
Call Leqvio support center to follow up coverage determination.  Per Leqvio representative when they call patient to confirm his insurance information, patient was concerned about the s/e of the medication and told the support center he does not wanted to proceed with the medication.  So call patient to address s/e concern. N/A LVM.  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 When he was talking to [facility] support center one of the mentioned s/e was diarrhea and he certainly does not want to get that. He believes in natural sources for healthy living. His memory is not great after he had Covid-19.   After 20 minutes of discussion of PCSK9i and inclisiran and Nexlizet  potential s/e, these options benefits vs the risks again patient is in agreement to proceed with Leqvio option. Patient thinks once loading dose done (0, 3 months) Leqvio would be easy option to be compliance and hassall free.  Patient provided consent over the phone to share his updated medicare ID: [id number] with Leqvio support center.

## 2023-03-01 ENCOUNTER — Other Ambulatory Visit: Payer: Self-pay | Admitting: Pharmacist

## 2023-03-01 NOTE — Addendum Note (Signed)
Addended by: Tylene Fantasia on: 03/01/2023 12:01 PM   Modules accepted: Orders

## 2023-03-03 ENCOUNTER — Telehealth: Payer: Self-pay | Admitting: Pharmacy Technician

## 2023-03-03 ENCOUNTER — Encounter: Payer: Self-pay | Admitting: Interventional Cardiology

## 2023-03-03 NOTE — Telephone Encounter (Signed)
Loma Sousa note:  Patient will be scheduled as soon as possible.  Auth Submission: NO AUTH NEEDED Site of care: Site of care: [facility] Payer: MEDICARE A/B & AARP Medication & CPT/J Code(s) submitted: Leqvio (Inclisiran) [phone] Route of submission (phone, fax, portal):  Phone # Fax # Auth type: Buy/Bill Units/visits requested: 2 Reference number:  Approval from: 03/03/23 to 11/22/23     Medicare will cover 80% and AARP supp will pick-up remaining 20%

## 2023-03-17 ENCOUNTER — Ambulatory Visit (INDEPENDENT_AMBULATORY_CARE_PROVIDER_SITE_OTHER): Payer: Medicare Other

## 2023-03-17 VITALS — BP 152/72 | HR 60 | Temp 97.8°F | Resp 18 | Ht 70.0 in | Wt 144.6 lb

## 2023-03-17 DIAGNOSIS — E782 Mixed hyperlipidemia: Secondary | ICD-10-CM

## 2023-03-17 DIAGNOSIS — Z953 Presence of xenogenic heart valve: Secondary | ICD-10-CM

## 2023-03-17 DIAGNOSIS — I700 Atherosclerosis of aorta: Secondary | ICD-10-CM | POA: Diagnosis not present

## 2023-03-17 MED ORDER — INCLISIRAN SODIUM 284 MG/1.5ML ~~LOC~~ SOSY
284.0000 mg | PREFILLED_SYRINGE | Freq: Once | SUBCUTANEOUS | Status: AC
  Administered 2023-03-17: 284 mg via SUBCUTANEOUS
  Filled 2023-03-17: qty 1.5

## 2023-03-17 NOTE — Progress Notes (Signed)
Diagnosis: Aortic atherosclerosis  Provider:  Chilton Greathouse MD  Procedure: Injection  Leqvio (inclisiran), Dose: 284 mg, Site: subcutaneous, Number of injections: 1  Post Care: Observation period completed  Discharge: Condition: Good, Destination: Home . AVS Provided  Performed by:  Garnette Czech, RN

## 2023-03-29 DIAGNOSIS — E7800 Pure hypercholesterolemia, unspecified: Secondary | ICD-10-CM | POA: Diagnosis not present

## 2023-06-16 ENCOUNTER — Ambulatory Visit (INDEPENDENT_AMBULATORY_CARE_PROVIDER_SITE_OTHER): Payer: Medicare Other

## 2023-06-16 VITALS — BP 159/73 | HR 61 | Temp 97.6°F | Resp 16 | Ht 70.0 in | Wt 136.6 lb

## 2023-06-16 DIAGNOSIS — E782 Mixed hyperlipidemia: Secondary | ICD-10-CM

## 2023-06-16 DIAGNOSIS — I700 Atherosclerosis of aorta: Secondary | ICD-10-CM

## 2023-06-16 DIAGNOSIS — Z953 Presence of xenogenic heart valve: Secondary | ICD-10-CM

## 2023-06-16 MED ORDER — INCLISIRAN SODIUM 284 MG/1.5ML ~~LOC~~ SOSY
284.0000 mg | PREFILLED_SYRINGE | Freq: Once | SUBCUTANEOUS | Status: AC
  Administered 2023-06-16: 284 mg via SUBCUTANEOUS
  Filled 2023-06-16: qty 1.5

## 2023-06-16 NOTE — Progress Notes (Signed)
Diagnosis: Hyperlipidemia  Provider:  Chilton Greathouse MD  Procedure: Injection  Leqvio (inclisiran), Dose: 284 mg, Site: subcutaneous, Number of injections: 1  Post Care:  No observation needed. Right arm injection  Discharge: Condition: Good, Destination: Home . AVS Declined  Performed by:  Rico Ala, LPN

## 2023-07-28 DIAGNOSIS — C189 Malignant neoplasm of colon, unspecified: Secondary | ICD-10-CM | POA: Diagnosis not present

## 2023-07-29 DIAGNOSIS — I739 Peripheral vascular disease, unspecified: Secondary | ICD-10-CM | POA: Diagnosis not present

## 2023-07-29 DIAGNOSIS — Z0000 Encounter for general adult medical examination without abnormal findings: Secondary | ICD-10-CM | POA: Diagnosis not present

## 2023-07-29 DIAGNOSIS — I2510 Atherosclerotic heart disease of native coronary artery without angina pectoris: Secondary | ICD-10-CM | POA: Diagnosis not present

## 2023-07-29 DIAGNOSIS — N400 Enlarged prostate without lower urinary tract symptoms: Secondary | ICD-10-CM | POA: Diagnosis not present

## 2023-07-29 DIAGNOSIS — R030 Elevated blood-pressure reading, without diagnosis of hypertension: Secondary | ICD-10-CM | POA: Diagnosis not present

## 2023-07-29 DIAGNOSIS — Z5181 Encounter for therapeutic drug level monitoring: Secondary | ICD-10-CM | POA: Diagnosis not present

## 2023-07-29 DIAGNOSIS — Z23 Encounter for immunization: Secondary | ICD-10-CM | POA: Diagnosis not present

## 2023-07-29 DIAGNOSIS — H6120 Impacted cerumen, unspecified ear: Secondary | ICD-10-CM | POA: Diagnosis not present

## 2023-07-29 DIAGNOSIS — E7800 Pure hypercholesterolemia, unspecified: Secondary | ICD-10-CM | POA: Diagnosis not present

## 2023-07-29 LAB — CMP 10231: EGFR (Non-African Amer.): 99

## 2023-08-08 ENCOUNTER — Telehealth: Payer: Self-pay | Admitting: Pharmacist

## 2023-08-08 NOTE — Telephone Encounter (Signed)
Discussed lipid lab result. LDLc 64 mg/dl (goal <09 mg/dl) tolerating Leqvio well without side effects. Next lipid lab due annually.

## 2023-08-17 DIAGNOSIS — Z85038 Personal history of other malignant neoplasm of large intestine: Secondary | ICD-10-CM | POA: Diagnosis not present

## 2023-08-17 DIAGNOSIS — K648 Other hemorrhoids: Secondary | ICD-10-CM | POA: Diagnosis not present

## 2023-08-17 DIAGNOSIS — D123 Benign neoplasm of transverse colon: Secondary | ICD-10-CM | POA: Diagnosis not present

## 2023-08-17 DIAGNOSIS — Z08 Encounter for follow-up examination after completed treatment for malignant neoplasm: Secondary | ICD-10-CM | POA: Diagnosis not present

## 2023-08-17 DIAGNOSIS — Z980 Intestinal bypass and anastomosis status: Secondary | ICD-10-CM | POA: Diagnosis not present

## 2023-08-19 DIAGNOSIS — D123 Benign neoplasm of transverse colon: Secondary | ICD-10-CM | POA: Diagnosis not present

## 2023-09-09 ENCOUNTER — Telehealth: Payer: Self-pay | Admitting: Internal Medicine

## 2023-09-09 NOTE — Telephone Encounter (Signed)
   Crystal from [employer], she reaching out at the request of Dr. Kateri Plummer who would like to inquire whether the patient should continue their infusion to help lower cholesterol, as the patient has expressed a desire to cancel it. She said, to call pt for recommendation

## 2023-09-12 NOTE — Telephone Encounter (Signed)
Left message for pt to call.

## 2023-09-16 ENCOUNTER — Ambulatory Visit: Payer: Medicare Other

## 2023-09-16 NOTE — Telephone Encounter (Signed)
Left detailed message for pt to call back.  

## 2023-09-16 NOTE — Telephone Encounter (Signed)
LDL has decreased from 139 to 64 on Leqvio. Recommend he continue since it is being effective. But it is his choice

## 2023-09-20 NOTE — Telephone Encounter (Signed)
Left message to call office

## 2023-09-20 NOTE — Telephone Encounter (Signed)
I spoke with patient and gave him message from pharmacist.  Patient plans to continue [facility]

## 2023-12-01 ENCOUNTER — Telehealth: Payer: Self-pay

## 2023-12-01 NOTE — Telephone Encounter (Signed)
 Auth Submission: NO AUTH NEEDED Site of care: Site of care: [facility] Payer: MEDICARE A/B & AARP Medication & CPT/J Code(s) submitted: Leqvio  (Inclisiran) J1306 Route of submission (phone, fax, portal):  Phone # Fax # Auth type: Buy/Bill Units/visits requested: 284mg  x 2 doses Reference number:  Approval from: 03/03/23 to 12/22/24

## 2023-12-20 ENCOUNTER — Ambulatory Visit: Payer: Medicare Other

## 2023-12-20 ENCOUNTER — Encounter: Payer: Self-pay | Admitting: Pulmonary Disease

## 2023-12-20 VITALS — BP 152/73 | HR 63 | Temp 97.7°F | Resp 20 | Ht 70.0 in | Wt 142.0 lb

## 2023-12-20 DIAGNOSIS — I700 Atherosclerosis of aorta: Secondary | ICD-10-CM | POA: Diagnosis not present

## 2023-12-20 DIAGNOSIS — Z953 Presence of xenogenic heart valve: Secondary | ICD-10-CM | POA: Diagnosis not present

## 2023-12-20 DIAGNOSIS — E782 Mixed hyperlipidemia: Secondary | ICD-10-CM

## 2023-12-20 MED ORDER — INCLISIRAN SODIUM 284 MG/1.5ML ~~LOC~~ SOSY
284.0000 mg | PREFILLED_SYRINGE | Freq: Once | SUBCUTANEOUS | Status: AC
  Administered 2023-12-20: 284 mg via SUBCUTANEOUS
  Filled 2023-12-20: qty 1.5

## 2023-12-20 NOTE — Progress Notes (Signed)
Diagnosis: Hyperlipidemia  Provider:  Chilton Greathouse MD  Procedure: Injection  Leqvio (inclisiran), Dose: 284 mg, Site: subcutaneous, Number of injections: 1  Injection Site(s): Right arm  Post Care: Patient declined observation  Discharge: Condition: Good, Destination: Home . AVS Provided  Performed by:  Adriana Mccallum, RN

## 2023-12-29 DIAGNOSIS — I5032 Chronic diastolic (congestive) heart failure: Secondary | ICD-10-CM | POA: Insufficient documentation

## 2023-12-29 DIAGNOSIS — I10 Essential (primary) hypertension: Secondary | ICD-10-CM | POA: Insufficient documentation

## 2023-12-29 NOTE — Progress Notes (Addendum)
 Cardiology Office Note:    Date:  12/30/2023  ID:  Abbott, Jasinski 11-27-1940, MRN [id number] PCP: Kip Righter, MD  [facility] Providers Cardiologist:  Candyce Reek, MD       Patient Profile:      Aortic stenosis S/p AVR 04/2017 Post op c/b sternal infection  TTE 01/18/23: EF 60-65, no RWMA, Gr 1 DD, GLS -20.4, NL RVSF, mild to mod RAE, AVR ok w no PVL, RAP 3 Coronary artery disease  S/p 2.5 x 38 mm, 2.75 x 38 mm, 2.75 x 24 mm DES to RCA in 03/2011 LHC 04/11/17: RCA stents patent, LCx 50, LAD 25 (HFpEF) heart failure with preserved ejection fraction  Hyperlipidemia   Pt has declined statins in the past; declined Rx drugs in the past  Started on Leqvio  in 01/2023 Hypertension  PACs Monitor 04/2019: freq PACs, occ SVT (longest 16.2 sec) Carotid US  04/21/17: Bilat ICA 1-39  Aortic atherosclerosis  Colon CA       Darin Simmons is a 83 y.o. male who returns for follow up of valvular heart disease, CAD. He was last seen by Dr. Reek in 12/2022.   Discussed the use of AI scribe software for clinical note transcription with the patient, who gave verbal consent to proceed.  History of Present Illness   Approximately a month ago, he experienced three episodes of palpitations. Each episode lasted about three to four seconds and has not recurred since. During these episodes, he did not experience shortness of breath or chest pain, but he expressed concern about them. He describes a 'tiny ache' in his left chest area, which he refers to as a presence rather than pain. This sensation was fairly constant for a couple of weeks but has since resolved. The ache did not radiate and was not associated with physical activity. He did not take any medication for it and did not experience shortness of breath or other symptoms. He is currently taking Leqvio  for cholesterol and valsartan for blood pressure management. He monitors his blood pressure at home, noting readings in the 130s.  Today's blood pressure was 140/70, but he missed a dose of valsartan. He maintains a morning workout routine involving yoga and stretching exercises, although recent stress has impacted his ability to exercise regularly. He enjoys gardening and typically spends several hours a day outdoors during the warmer months.      ROS-See HPI    Studies Reviewed:   EKG Interpretation Date/Time:  Friday December 30 2023 09:17:36 EST Ventricular Rate:  62 PR Interval:  164 QRS Duration:  116 QT Interval:  428 QTC Calculation: 434 R Axis:   80  Text Interpretation: Normal sinus rhythm Left ventricular hypertrophy with QRS widening and repolarization abnormality Cannot rule out Inferior infarct , age undetermined No significant change since last tracing Confirmed by Lelon Hamilton [phone]) on 12/30/2023 9:58:10 AM   Results    Risk Assessment/Calculations:     HYPERTENSION CONTROL Vitals:   12/30/23 0922 12/30/23 1015  BP: (!) 152/70 (!) 140/70    The patient's blood pressure is elevated above target today.  In order to address the patient's elevated BP: Blood pressure will be monitored at home to determine if medication changes need to be made.          Physical Exam:   VS:  BP (!) 140/70   Pulse 62   Ht 5' 10 (1.778 m)   Wt 143 lb (64.9 kg)   SpO2 99%  BMI 20.52 kg/m    Wt Readings from Last 3 Encounters:  12/30/23 143 lb (64.9 kg)  12/20/23 142 lb (64.4 kg)  06/16/23 136 lb 9.6 oz (62 kg)    Constitutional:      Appearance: Healthy appearance. Not in distress.  Neck:     Vascular: No carotid bruit. JVD normal.  Pulmonary:     Breath sounds: Normal breath sounds. No wheezing. No rales.  Cardiovascular:     Normal rate. Regular rhythm.     Murmurs: There is no murmur.  Edema:    Peripheral edema absent.  Abdominal:     Palpations: Abdomen is soft.       Assessment and Plan:   Assessment & Plan Coronary artery disease involving native coronary artery of native heart  without angina pectoris Status post DES x 3 in 2012.  Cardiac catheterization in 2018 with patent stents and mild-moderate nonobstructive disease.  He declines taking aspirin .  He recently had chest discomfort that was noncardiac.  We discussed whether or not to proceed with stress testing at this time.  He prefers to hold off.  I agree with this.  If he has recurrent symptoms in the future, we can certainly consider stress testing. -Continue Leqvio  every 6 months, nitroglycerin  as needed -Follow-up 1 year Nonrheumatic aortic valve stenosis S/p AVR in 2018. AVR was functioning normally on TTE in 12/2022. Continue SBE prophylaxis. He had not been following this. I explained to him the dangers of not taking SBE prior to dental procedures.  -Amoxicillin  2000 mg 30-60 min prior to dental work Chronic heart failure with preserved ejection fraction (HFpEF) (HCC) Volume stable Essential hypertension Blood pressure somewhat elevated.  He has not had valsartan yet today. -Continue valsartan 40 mg daily -Monitor blood pressure for 2 weeks and send readings for review ADDENDUM: BP log received and reviewed 02/07/2024 - BPs are optimal. Continue current Rx. Mixed hyperlipidemia Continue Leqvio  every 6 months.  Obtain c-Met, lipids today Palpitations He notes episodes of palpitations several months ago.  He has had none since.  He had a monitor in 2020 with frequent PACs.  We discussed monitoring versus watchful waiting.  He prefers to wait and I agree.  He knows to contact us  if he has recurrent symptoms that we can arrange cardiac monitor.      Dispo:  Return in about 1 year (around 12/29/2024) for Routine Follow Up, w/ Dr. Jeffrie, or Glendia Ferrier, PA-C.  Signed, Glendia Ferrier, PA-C

## 2023-12-30 ENCOUNTER — Ambulatory Visit: Payer: Medicare Other | Admitting: General Practice

## 2023-12-30 ENCOUNTER — Encounter: Payer: Self-pay | Admitting: Physician Assistant

## 2023-12-30 ENCOUNTER — Ambulatory Visit: Payer: Medicare Other | Attending: Physician Assistant | Admitting: Physician Assistant

## 2023-12-30 VITALS — BP 140/70 | HR 62 | Ht 70.0 in | Wt 143.0 lb

## 2023-12-30 DIAGNOSIS — I2510 Atherosclerotic heart disease of native coronary artery without angina pectoris: Secondary | ICD-10-CM | POA: Diagnosis not present

## 2023-12-30 DIAGNOSIS — I5032 Chronic diastolic (congestive) heart failure: Secondary | ICD-10-CM | POA: Diagnosis not present

## 2023-12-30 DIAGNOSIS — E782 Mixed hyperlipidemia: Secondary | ICD-10-CM | POA: Insufficient documentation

## 2023-12-30 DIAGNOSIS — I10 Essential (primary) hypertension: Secondary | ICD-10-CM | POA: Insufficient documentation

## 2023-12-30 DIAGNOSIS — R002 Palpitations: Secondary | ICD-10-CM | POA: Diagnosis not present

## 2023-12-30 DIAGNOSIS — I350 Nonrheumatic aortic (valve) stenosis: Secondary | ICD-10-CM | POA: Diagnosis not present

## 2023-12-30 MED ORDER — AMOXICILLIN 500 MG PO TABS: ORAL_TABLET | ORAL | 3 refills | Status: AC

## 2023-12-30 NOTE — Assessment & Plan Note (Signed)
 Status post DES x 3 in 2012.  Cardiac catheterization in 2018 with patent stents and mild-moderate nonobstructive disease.  He declines taking aspirin .  He recently had chest discomfort that was noncardiac.  We discussed whether or not to proceed with stress testing at this time.  He prefers to hold off.  I agree with this.  If he has recurrent symptoms in the future, we can certainly consider stress testing. -Continue Leqvio  every 6 months, nitroglycerin  as needed -Follow-up 1 year

## 2023-12-30 NOTE — Assessment & Plan Note (Signed)
 S/p AVR in 2018. AVR was functioning normally on TTE in 12/2022. Continue SBE prophylaxis. He had not been following this. I explained to him the dangers of not taking SBE prior to dental procedures.  -Amoxicillin  2000 mg 30-60 min prior to dental work

## 2023-12-30 NOTE — Patient Instructions (Signed)
 Medication Instructions:  Your physician has recommended you make the following change in your medication:  START Amoxicillin  500 mg taking 4 tablets 30-60 mins prior to any dental work  *If you need a refill on your cardiac medications before your next appointment, please call your pharmacy*   Lab Work: TODAY:  CMET & LIPIDS  If you have labs (blood work) drawn today and your tests are completely normal, you will receive your results only by: MyChart Message (if you have MyChart) OR A paper copy in the mail If you have any lab test that is abnormal or we need to change your treatment, we will call you to review the results.   Testing/Procedures: None ordered   Follow-Up: At [facility], you and your health needs are our priority.  As part of our continuing mission to provide you with exceptional heart care, we have created designated Provider Care Teams.  These Care Teams include your primary Cardiologist (physician) and Advanced Practice Providers (APPs -  Physician Assistants and Nurse Practitioners) who all work together to provide you with the care you need, when you need it.  We recommend signing up for the patient portal called MyChart.  Sign up information is provided on this After Visit Summary.  MyChart is used to connect with patients for Virtual Visits (Telemedicine).  Patients are able to view lab/test results, encounter notes, upcoming appointments, etc.  Non-urgent messages can be sent to your provider as well.   To learn more about what you can do with MyChart, go to [email or web address].    Your next appointment:   12 month(s)  Provider:   Oneil Parchment, MD   or Glendia Ferrier, PA-C         Other Instructions Your physician has requested that you regularly monitor and record your blood pressure readings at home. Please use the same machine at the same time of day to check your readings and record them to bring to your follow-up visit.   Please monitor  blood pressures and keep a log of your readings for 2 weeks then send a mychart message with the readings.    Make sure to check 2 hours after your medications.    AVOID these things for 30 minutes before checking your blood pressure: No Drinking caffeine. No Drinking alcohol. No Eating. No Smoking. No Exercising.   Five minutes before checking your blood pressure: Pee. Sit in a dining chair. Avoid sitting in a soft couch or armchair. Be quiet. Do not talk     1st Floor: - Lobby - Registration  - Pharmacy  - Lab - Cafe  2nd Floor: - PV Lab - Diagnostic Testing (echo, CT, nuclear med)  3rd Floor: - Vacant  4th Floor: - TCTS (cardiothoracic surgery) - AFib Clinic - Structural Heart Clinic - Vascular Surgery  - Vascular Ultrasound  5th Floor: - HeartCare Cardiology (general and EP) - Clinical Pharmacy for coumadin, hypertension, lipid, weight-loss medications, and med management appointments    Valet parking services will be available as well.

## 2023-12-30 NOTE — Assessment & Plan Note (Signed)
 Continue Leqvio  every 6 months.  Obtain c-Met, lipids today

## 2023-12-30 NOTE — Assessment & Plan Note (Signed)
Volume stable.

## 2023-12-30 NOTE — Assessment & Plan Note (Addendum)
 Blood pressure somewhat elevated.  He has not had valsartan yet today. -Continue valsartan 40 mg daily -Monitor blood pressure for 2 weeks and send readings for review ADDENDUM: BP log received and reviewed 02/07/2024 - BPs are optimal. Continue current Rx.

## 2024-01-05 DIAGNOSIS — I350 Nonrheumatic aortic (valve) stenosis: Secondary | ICD-10-CM | POA: Diagnosis not present

## 2024-01-05 DIAGNOSIS — E782 Mixed hyperlipidemia: Secondary | ICD-10-CM | POA: Diagnosis not present

## 2024-01-05 DIAGNOSIS — I5032 Chronic diastolic (congestive) heart failure: Secondary | ICD-10-CM | POA: Diagnosis not present

## 2024-01-05 DIAGNOSIS — I2510 Atherosclerotic heart disease of native coronary artery without angina pectoris: Secondary | ICD-10-CM | POA: Diagnosis not present

## 2024-01-05 DIAGNOSIS — I10 Essential (primary) hypertension: Secondary | ICD-10-CM | POA: Diagnosis not present

## 2024-01-05 LAB — COMPREHENSIVE METABOLIC PANEL
ALT: 32 [IU]/L (ref 0–44)
AST: 33 [IU]/L (ref 0–40)
Albumin: 4.8 g/dL (ref 3.8–4.8)
Alkaline Phosphatase: 117 [IU]/L (ref 44–121)
BUN/Creatinine Ratio: 18 (ref 10–24)
BUN: 14 mg/dL (ref 8–27)
Bilirubin Total: 0.7 mg/dL (ref 0.0–1.2)
CO2: 24 mmol/L (ref 20–29)
Calcium: 9.1 mg/dL (ref 8.6–10.2)
Chloride: 102 mmol/L (ref 96–106)
Creatinine, Ser: 0.77 mg/dL (ref 0.76–1.27)
Globulin, Total: 1.9 g/dL (ref 1.5–4.5)
Glucose: 87 mg/dL (ref 70–99)
Potassium: 4.4 mmol/L (ref 3.5–5.2)
Sodium: 142 mmol/L (ref 134–144)
Total Protein: 6.7 g/dL (ref 6.0–8.5)
eGFR: 91 mL/min/{1.73_m2} (ref >59)

## 2024-01-05 LAB — LIPID PANEL
Chol/HDL Ratio: 3 {ratio} (ref 0.0–5.0)
Cholesterol, Total: 151 mg/dL (ref 100–199)
HDL: 50 mg/dL (ref >39)
LDL Chol Calc (NIH): 77 mg/dL (ref 0–99)
Triglycerides: 135 mg/dL (ref 0–149)
VLDL Cholesterol Cal: 24 mg/dL (ref 5–40)

## 2024-02-23 ENCOUNTER — Telehealth: Payer: Self-pay | Admitting: Physician Assistant

## 2024-02-23 DIAGNOSIS — R002 Palpitations: Secondary | ICD-10-CM

## 2024-02-23 NOTE — Telephone Encounter (Signed)
 Please order Zio XT for 14 days. Dx: palpitations. Tereso Newcomer, PA-C    02/23/2024 4:46 PM

## 2024-02-23 NOTE — Telephone Encounter (Signed)
 Spoke with pt and explained that [facility], PA-C would like to order a Zio 14 day monitor. Pt verbalized understanding of plan and had no further questions at this time. Order has been placed.

## 2024-02-23 NOTE — Telephone Encounter (Signed)
 Pt calling to advise that he is open to speaking with someone regarding home monitoring. He initially wasn't open to this but now is wanting to speak with someone regarding it. Requesting bc

## 2024-02-23 NOTE — Addendum Note (Signed)
 Addended by: Cherylann Banas on: 02/23/2024 05:02 PM   Modules accepted: Orders

## 2024-02-23 NOTE — Telephone Encounter (Signed)
 Spoke with pt and reviewed Darin Newcomer, PA-C last OV note- pt had stated he wanted to hold off on a heart monitor for now but called today and stated that he would actually like to move forward with the heart monitor after thinking about. Explained to pt that I will send a message to [place] and see what exactly he would like to order and then I will call the pt back to let him know the order has been placed. Pt verbalized understanding and had no further questions at this time.

## 2024-02-27 ENCOUNTER — Ambulatory Visit: Attending: Physician Assistant

## 2024-02-27 DIAGNOSIS — R002 Palpitations: Secondary | ICD-10-CM

## 2024-02-27 NOTE — Progress Notes (Unsigned)
Enrolled for [employer] to mail a ZIO XT long term holter monitor to the patients address on file.   Dr. Skains to read. 

## 2024-03-29 DIAGNOSIS — I2510 Atherosclerotic heart disease of native coronary artery without angina pectoris: Secondary | ICD-10-CM | POA: Diagnosis not present

## 2024-03-29 DIAGNOSIS — R5383 Other fatigue: Secondary | ICD-10-CM | POA: Diagnosis not present

## 2024-03-29 DIAGNOSIS — J988 Other specified respiratory disorders: Secondary | ICD-10-CM | POA: Diagnosis not present

## 2024-03-29 DIAGNOSIS — R059 Cough, unspecified: Secondary | ICD-10-CM | POA: Diagnosis not present

## 2024-03-30 DIAGNOSIS — R059 Cough, unspecified: Secondary | ICD-10-CM | POA: Diagnosis not present

## 2024-03-30 DIAGNOSIS — I700 Atherosclerosis of aorta: Secondary | ICD-10-CM | POA: Diagnosis not present

## 2024-04-20 DIAGNOSIS — J988 Other specified respiratory disorders: Secondary | ICD-10-CM | POA: Diagnosis not present

## 2024-04-20 DIAGNOSIS — G4700 Insomnia, unspecified: Secondary | ICD-10-CM | POA: Diagnosis not present

## 2024-04-20 DIAGNOSIS — R042 Hemoptysis: Secondary | ICD-10-CM | POA: Diagnosis not present

## 2024-06-19 ENCOUNTER — Ambulatory Visit: Payer: Medicare Other

## 2024-06-19 MED ORDER — INCLISIRAN SODIUM 284 MG/1.5ML ~~LOC~~ SOSY: 284.0000 mg | PREFILLED_SYRINGE | Freq: Once | SUBCUTANEOUS | Status: DC

## 2024-06-20 ENCOUNTER — Encounter: Payer: Self-pay | Admitting: Interventional Cardiology

## 2024-08-16 DIAGNOSIS — N400 Enlarged prostate without lower urinary tract symptoms: Secondary | ICD-10-CM | POA: Diagnosis not present

## 2024-08-16 DIAGNOSIS — Z5181 Encounter for therapeutic drug level monitoring: Secondary | ICD-10-CM | POA: Diagnosis not present

## 2024-08-16 DIAGNOSIS — Z23 Encounter for immunization: Secondary | ICD-10-CM | POA: Diagnosis not present

## 2024-08-16 DIAGNOSIS — E7800 Pure hypercholesterolemia, unspecified: Secondary | ICD-10-CM | POA: Diagnosis not present

## 2024-08-16 DIAGNOSIS — I739 Peripheral vascular disease, unspecified: Secondary | ICD-10-CM | POA: Diagnosis not present

## 2024-08-16 DIAGNOSIS — I2510 Atherosclerotic heart disease of native coronary artery without angina pectoris: Secondary | ICD-10-CM | POA: Diagnosis not present

## 2024-08-16 DIAGNOSIS — Z0000 Encounter for general adult medical examination without abnormal findings: Secondary | ICD-10-CM | POA: Diagnosis not present

## 2024-08-16 DIAGNOSIS — R030 Elevated blood-pressure reading, without diagnosis of hypertension: Secondary | ICD-10-CM | POA: Diagnosis not present

## 2024-08-21 ENCOUNTER — Ambulatory Visit

## 2024-08-21 VITALS — BP 152/79 | HR 50 | Temp 97.5°F | Resp 16 | Ht 70.0 in | Wt 141.8 lb

## 2024-08-21 DIAGNOSIS — E782 Mixed hyperlipidemia: Secondary | ICD-10-CM | POA: Diagnosis not present

## 2024-08-21 DIAGNOSIS — Z953 Presence of xenogenic heart valve: Secondary | ICD-10-CM | POA: Diagnosis not present

## 2024-08-21 DIAGNOSIS — I700 Atherosclerosis of aorta: Secondary | ICD-10-CM | POA: Diagnosis not present

## 2024-08-21 MED ORDER — INCLISIRAN SODIUM 284 MG/1.5ML ~~LOC~~ SOSY
284.0000 mg | PREFILLED_SYRINGE | Freq: Once | SUBCUTANEOUS | Status: AC
  Administered 2024-08-21: 284 mg via SUBCUTANEOUS
  Filled 2024-08-21: qty 1.5

## 2024-08-21 NOTE — Progress Notes (Signed)
 Diagnosis: Hyperlipidemia  Provider:  Mannam, Praveen MD  Procedure: Injection  Leqvio  (inclisiran), Dose: 284 mg, Site: subcutaneous, Number of injections: 1  Injection Site(s): Left arm  Post Care: Patient declined observation  Discharge: Condition: Good, Destination: Home . AVS Provided  Performed by:  Leonard Feigel, RN

## 2024-09-18 DIAGNOSIS — Z23 Encounter for immunization: Secondary | ICD-10-CM | POA: Diagnosis not present

## 2024-11-27 ENCOUNTER — Telehealth: Payer: Self-pay

## 2024-11-27 NOTE — Telephone Encounter (Signed)
 Auth Submission: NO AUTH NEEDED Site of care: Site of care: [facility] Payer: Medicare A/B with AARP supplement Medication & CPT/J Code(s) submitted: Leqvio  (Inclisiran) J1306 Diagnosis Code:  Route of submission (phone, fax, portal):  Phone # Fax # Auth type: Buy/Bill PB Units/visits requested: 284mg  x 2 doses Reference number:  Approval from: 11/27/24 to 12/22/25

## 2025-01-01 ENCOUNTER — Ambulatory Visit: Admitting: Cardiology

## 2025-02-19 ENCOUNTER — Ambulatory Visit
